# Patient Record
Sex: Male | Born: 1935 | Race: White | Hispanic: No | Marital: Married | Smoking: Former smoker
Health system: Southern US, Community
[De-identification: ages and names within clinical notes are randomized; demographics above are authoritative.]

## PROBLEM LIST (undated history)

## (undated) ENCOUNTER — Emergency Department (HOSPITAL_COMMUNITY): Admission: EM | Discharge status: Absent without leave | Payer: Medicare Other

## (undated) DIAGNOSIS — Z9221 Personal history of antineoplastic chemotherapy: Secondary | ICD-10-CM

## (undated) DIAGNOSIS — I10 Essential (primary) hypertension: Secondary | ICD-10-CM

## (undated) DIAGNOSIS — IMO0001 Reserved for inherently not codable concepts without codable children: Secondary | ICD-10-CM

## (undated) DIAGNOSIS — M1990 Unspecified osteoarthritis, unspecified site: Secondary | ICD-10-CM

## (undated) DIAGNOSIS — Z923 Personal history of irradiation: Secondary | ICD-10-CM

## (undated) DIAGNOSIS — C3490 Malignant neoplasm of unspecified part of unspecified bronchus or lung: Secondary | ICD-10-CM

## (undated) DIAGNOSIS — Z973 Presence of spectacles and contact lenses: Secondary | ICD-10-CM

## (undated) HISTORY — DX: Personal history of irradiation: Z92.3

## (undated) HISTORY — PX: LUNG BIOPSY: SHX232

## (undated) HISTORY — PX: TOTAL KNEE ARTHROPLASTY: SHX125

## (undated) HISTORY — DX: Personal history of antineoplastic chemotherapy: Z92.21

## (undated) HISTORY — PX: COLONOSCOPY: SHX174

---

## 1997-12-02 ENCOUNTER — Inpatient Hospital Stay (HOSPITAL_COMMUNITY): Admission: RE | Admit: 1997-12-02 | Discharge: 1997-12-07 | Payer: Self-pay | Admitting: Orthopaedic Surgery

## 1997-12-08 ENCOUNTER — Other Ambulatory Visit: Admission: RE | Admit: 1997-12-08 | Discharge: 1997-12-08 | Payer: Self-pay | Admitting: Orthopaedic Surgery

## 1997-12-11 ENCOUNTER — Other Ambulatory Visit: Admission: RE | Admit: 1997-12-11 | Discharge: 1997-12-11 | Payer: Self-pay | Admitting: Orthopaedic Surgery

## 1997-12-18 ENCOUNTER — Other Ambulatory Visit: Admission: RE | Admit: 1997-12-18 | Discharge: 1997-12-18 | Payer: Self-pay | Admitting: Orthopaedic Surgery

## 1997-12-25 ENCOUNTER — Ambulatory Visit (HOSPITAL_COMMUNITY): Admission: RE | Admit: 1997-12-25 | Discharge: 1997-12-25 | Payer: Self-pay | Admitting: Orthopaedic Surgery

## 1998-01-01 ENCOUNTER — Ambulatory Visit (HOSPITAL_COMMUNITY): Admission: RE | Admit: 1998-01-01 | Discharge: 1998-01-01 | Payer: Self-pay | Admitting: Orthopaedic Surgery

## 2003-09-09 ENCOUNTER — Emergency Department (HOSPITAL_COMMUNITY): Admission: EM | Admit: 2003-09-09 | Discharge: 2003-09-09 | Payer: Self-pay | Admitting: Emergency Medicine

## 2005-06-08 ENCOUNTER — Ambulatory Visit: Payer: Self-pay | Admitting: Internal Medicine

## 2005-06-23 ENCOUNTER — Ambulatory Visit: Payer: Self-pay | Admitting: Internal Medicine

## 2014-10-21 ENCOUNTER — Other Ambulatory Visit: Payer: Self-pay | Admitting: Orthopaedic Surgery

## 2014-10-21 DIAGNOSIS — M25551 Pain in right hip: Secondary | ICD-10-CM

## 2014-10-22 ENCOUNTER — Ambulatory Visit
Admission: RE | Admit: 2014-10-22 | Discharge: 2014-10-22 | Disposition: A | Discharge status: Home / Self Care | Payer: Medicare Other | Source: Ambulatory Visit | Attending: Orthopaedic Surgery | Admitting: Orthopaedic Surgery

## 2014-10-22 DIAGNOSIS — M25551 Pain in right hip: Secondary | ICD-10-CM

## 2014-10-23 ENCOUNTER — Inpatient Hospital Stay (HOSPITAL_COMMUNITY)
Admission: AD | Admit: 2014-10-23 | Discharge: 2014-10-25 | DRG: 544 | Disposition: A | Discharge status: Home / Self Care | Payer: Medicare Other | Source: Ambulatory Visit | Attending: Internal Medicine | Admitting: Internal Medicine

## 2014-10-23 ENCOUNTER — Encounter (HOSPITAL_COMMUNITY): Payer: Self-pay | Admitting: Internal Medicine

## 2014-10-23 DIAGNOSIS — I129 Hypertensive chronic kidney disease with stage 1 through stage 4 chronic kidney disease, or unspecified chronic kidney disease: Secondary | ICD-10-CM | POA: Diagnosis present

## 2014-10-23 DIAGNOSIS — N189 Chronic kidney disease, unspecified: Secondary | ICD-10-CM | POA: Diagnosis present

## 2014-10-23 DIAGNOSIS — S72001A Fracture of unspecified part of neck of right femur, initial encounter for closed fracture: Secondary | ICD-10-CM | POA: Diagnosis not present

## 2014-10-23 DIAGNOSIS — Z87891 Personal history of nicotine dependence: Secondary | ICD-10-CM | POA: Diagnosis not present

## 2014-10-23 DIAGNOSIS — M84453A Pathological fracture, unspecified femur, initial encounter for fracture: Secondary | ICD-10-CM | POA: Diagnosis present

## 2014-10-23 DIAGNOSIS — E785 Hyperlipidemia, unspecified: Secondary | ICD-10-CM | POA: Diagnosis not present

## 2014-10-23 DIAGNOSIS — M84451A Pathological fracture, right femur, initial encounter for fracture: Secondary | ICD-10-CM | POA: Diagnosis not present

## 2014-10-23 DIAGNOSIS — Z885 Allergy status to narcotic agent status: Secondary | ICD-10-CM | POA: Diagnosis not present

## 2014-10-23 DIAGNOSIS — R7309 Other abnormal glucose: Secondary | ICD-10-CM | POA: Diagnosis not present

## 2014-10-23 DIAGNOSIS — S72009A Fracture of unspecified part of neck of unspecified femur, initial encounter for closed fracture: Secondary | ICD-10-CM

## 2014-10-23 DIAGNOSIS — M8440XA Pathological fracture, unspecified site, initial encounter for fracture: Secondary | ICD-10-CM

## 2014-10-23 DIAGNOSIS — R748 Abnormal levels of other serum enzymes: Secondary | ICD-10-CM | POA: Diagnosis not present

## 2014-10-23 DIAGNOSIS — I10 Essential (primary) hypertension: Secondary | ICD-10-CM | POA: Diagnosis not present

## 2014-10-23 DIAGNOSIS — M79651 Pain in right thigh: Secondary | ICD-10-CM | POA: Diagnosis present

## 2014-10-23 HISTORY — DX: Essential (primary) hypertension: I10

## 2014-10-23 LAB — CBC WITH DIFFERENTIAL/PLATELET
Basophils Absolute: 0 10*3/uL (ref 0.0–0.1)
Basophils Relative: 0 % (ref 0–1)
Eosinophils Absolute: 0.3 10*3/uL (ref 0.0–0.7)
Eosinophils Relative: 3 % (ref 0–5)
HEMATOCRIT: 38.4 % — AB (ref 39.0–52.0)
Hemoglobin: 12.9 g/dL — ABNORMAL LOW (ref 13.0–17.0)
LYMPHS ABS: 2.5 10*3/uL (ref 0.7–4.0)
LYMPHS PCT: 28 % (ref 12–46)
MCH: 30 pg (ref 26.0–34.0)
MCHC: 33.6 g/dL (ref 30.0–36.0)
MCV: 89.3 fL (ref 78.0–100.0)
MONO ABS: 0.7 10*3/uL (ref 0.1–1.0)
Monocytes Relative: 8 % (ref 3–12)
NEUTROS PCT: 61 % (ref 43–77)
Neutro Abs: 5.2 10*3/uL (ref 1.7–7.7)
PLATELETS: 241 10*3/uL (ref 150–400)
RBC: 4.3 MIL/uL (ref 4.22–5.81)
RDW: 12.4 % (ref 11.5–15.5)
WBC: 8.7 10*3/uL (ref 4.0–10.5)

## 2014-10-23 LAB — COMPREHENSIVE METABOLIC PANEL
ALT: 32 U/L (ref 0–53)
ANION GAP: 5 (ref 5–15)
AST: 24 U/L (ref 0–37)
Albumin: 3.6 g/dL (ref 3.5–5.2)
Alkaline Phosphatase: 80 U/L (ref 39–117)
BUN: 30 mg/dL — AB (ref 6–23)
CHLORIDE: 104 mmol/L (ref 96–112)
CO2: 27 mmol/L (ref 19–32)
Calcium: 9.3 mg/dL (ref 8.4–10.5)
Creatinine, Ser: 1.5 mg/dL — ABNORMAL HIGH (ref 0.50–1.35)
GFR calc Af Amer: 50 mL/min — ABNORMAL LOW (ref >90)
GFR calc non Af Amer: 43 mL/min — ABNORMAL LOW (ref >90)
Glucose, Bld: 164 mg/dL — ABNORMAL HIGH (ref 70–99)
Potassium: 4.6 mmol/L (ref 3.5–5.1)
Sodium: 136 mmol/L (ref 135–145)
Total Bilirubin: 0.5 mg/dL (ref 0.3–1.2)
Total Protein: 6.7 g/dL (ref 6.0–8.3)

## 2014-10-23 LAB — PROTIME-INR
INR: 1.06 (ref 0.00–1.49)
PROTHROMBIN TIME: 13.9 s (ref 11.6–15.2)

## 2014-10-23 LAB — TYPE AND SCREEN
ABO/RH(D): A POS
Antibody Screen: NEGATIVE

## 2014-10-23 LAB — LACTATE DEHYDROGENASE: LDH: 122 U/L (ref 94–250)

## 2014-10-23 LAB — PHOSPHORUS: Phosphorus: 3.8 mg/dL (ref 2.3–4.6)

## 2014-10-23 MED ORDER — METHOCARBAMOL 500 MG PO TABS: 500.0000 mg | ORAL_TABLET | Freq: Four times a day (QID) | ORAL | Status: DC | PRN

## 2014-10-23 MED ORDER — HYDROCODONE-ACETAMINOPHEN 5-325 MG PO TABS: 1.0000 | ORAL_TABLET | Freq: Four times a day (QID) | ORAL | Status: DC | PRN

## 2014-10-23 MED ORDER — FENTANYL CITRATE 0.05 MG/ML IJ SOLN: 25.0000 ug | INTRAMUSCULAR | Status: DC | PRN

## 2014-10-23 MED ORDER — METHOCARBAMOL 1000 MG/10ML IJ SOLN
500.0000 mg | Freq: Four times a day (QID) | INTRAMUSCULAR | Status: DC | PRN
  Filled 2014-10-23: qty 5

## 2014-10-23 MED ORDER — HEPARIN SODIUM (PORCINE) 5000 UNIT/ML IJ SOLN
5000.0000 [IU] | Freq: Three times a day (TID) | INTRAMUSCULAR | Status: DC
  Administered 2014-10-24 (×3): 5000 [IU] via SUBCUTANEOUS
  Filled 2014-10-23 (×4): qty 1

## 2014-10-23 MED ORDER — BISACODYL 10 MG RE SUPP: 10.0000 mg | Freq: Every day | RECTAL | Status: DC | PRN

## 2014-10-23 MED ORDER — SENNOSIDES-DOCUSATE SODIUM 8.6-50 MG PO TABS: 1.0000 | ORAL_TABLET | Freq: Every evening | ORAL | Status: DC | PRN

## 2014-10-23 NOTE — Progress Notes (Signed)
Pt being w/up per Dr Durward Fortes for R acute hip pain. X ray (-) However MRI done showed Lytic Lesion. Colon (-) 2006.  Hematuria W/Up (-) for Malig per URO. Last CXR was 2010 and was (-). However Lung CA needs to be ruled out. Dr Durward Fortes admitting pt to hospital 10/23/14 and will need to get CT Chest, Ab/Pelvis and decide what to Bx after.

## 2014-10-23 NOTE — Progress Notes (Signed)
Contacted by Ma Rings, PA, for Dr. Durward Fortes for direct admission to [facility].  81 y.o. Male smoker with HTN with Rt hip pain. Xray negative, but MRI shows right femoral neck fracture concerning for pathologic fracture. No known h/o cancer. Needs hip repair and workup of bone mets.  Dr. Durward Fortes operates at [facility], so needs admission here. Accepted to floor.  PCP is Dr. Virgina Jock.  CALL FLOW MANAGER UPON ARRIVAL TO UNIT. [id number].  Doree Barthel, MD [facility]

## 2014-10-23 NOTE — H&P (Signed)
[facility] History and Physical  Patient: Travis Medina  MRN: [id number]  DOB: 1933-08-26  DOS: the patient was seen and examined on 10/23/2014 PCP: [name], MD  Chief Complaint: Right thigh pain  HPI: Travis Medina is a 81 y.o. male with Past medical history of hypertension. The patient is presenting with complaints of right thigh pain. Patient mentions since last one week when he woke up from his sleep he started having complaints of pain on his right thigh specifically when he is bearing weight on it. He mentions the pain is dull and is continuous. He denies any fever chills trauma or injury or fall. He denies any dizziness or lightheadedness chest pain palpitation shortness of breath. He denies any decrease in appetite decrease in weight. He denies any recent change in medications. He denies any burning urination or loss of control of bowel or bladder.  The patient is coming from home And at his baseline independent for most of his ADL.  Review of Systems: as mentioned in the history of present illness.  A Comprehensive review of the other systems is negative.  Past Medical History  Diagnosis Date  . Essential hypertension    Past Surgical History  Procedure Laterality Date  . Total knee arthroplasty     Social History:  reports that he has quit smoking. His smoking use included Cigarettes. He has a 25 pack-year smoking history. He does not have any smokeless tobacco history on file. His alcohol and drug histories are not on file.  Allergies  Allergen Reactions  . Morphine And Related Itching and Rash    Family History  Problem Relation Age of Onset  . Heart failure Mother   . Colon cancer Sister     Prior to Admission medications   Medication Sig Start Date End Date Taking? Authorizing Provider  acetaminophen (TYLENOL) 500 MG tablet Take 1,000 mg by mouth every 6 (six) hours as needed for mild pain.   Yes Historical Provider, MD  aspirin 325 MG  tablet Take 325 mg by mouth daily.   Yes Historical Provider, MD  lisinopril (PRINIVIL,ZESTRIL) 5 MG tablet Take 5 mg by mouth daily.   Yes Historical Provider, MD  traMADol (ULTRAM) 50 MG tablet Take 50 mg by mouth every 12 (twelve) hours as needed for moderate pain.   Yes Historical Provider, MD    Physical Exam: Filed Vitals:   10/23/14 1654 10/23/14 2013  BP: 169/68 144/82  Pulse: 91 73  Temp: 98.6 F (37 C) 97.9 F (36.6 C)  TempSrc: Oral   Resp: 18 18  SpO2: 98% 90%    General: Alert, Awake and Oriented to Time, Place and Person. Appear in mild distress Eyes: PERRL ENT: Oral Mucosa clear moist. Neck: no JVD Cardiovascular: S1 and S2 Present, no Murmur, Peripheral Pulses Present Respiratory: Bilateral Air entry equal and Decreased, Clear to Auscultation, noCrackles, no wheezes Abdomen: Bowel Sound present, Soft and non tender Skin: no Rash Extremities: no Pedal edema, no calf tenderness  Neurologic: Grossly no focal neuro deficit.  Labs on Admission:  CBC: No results for input(s): WBC, NEUTROABS, HGB, HCT, MCV, PLT in the last 168 hours.  CMP  No results found for: NA, K, CL, CO2, GLUCOSE, BUN, CREATININE, CALCIUM, PROT, ALBUMIN, AST, ALT, ALKPHOS, BILITOT, GFRNONAA, GFRAA  No results for input(s): LIPASE, AMYLASE in the last 168 hours.  No results for input(s): CKTOTAL, CKMB, CKMBINDEX, TROPONINI in the last 168 hours. BNP (last 3 results) No results for input(s): BNP in  the last 8760 hours.  ProBNP (last 3 results) No results for input(s): PROBNP in the last 8760 hours.   Radiological Exams on Admission: Mr Pelvis Wo Contrast  10/22/2014   CLINICAL DATA:  Anterior right hip pain, worse with weight-bearing.  EXAM: MR OF THE PELVIS AND RIGHT HIP WITHOUT CONTRAST  TECHNIQUE: Multiplanar, multisequence MR imaging was performed. No intravenous contrast was administered.  COMPARISON:  None.  FINDINGS: Bones: There is intense marrow edema in the right femoral neck  extending into the proximal femoral diaphysis. Edema is most intense along the inferior 1/2 of the femoral neck where there is associated associated decreased T1 signal. Findings are consistent with fracture. The fracture may be pathologic as there appears to be cortical disruption along the posterior margin at the head - neck junction of the femur on axial images 12 through 14 of series 5. Bone marrow signal is otherwise unremarkable.  Articular cartilage and labrum  Articular cartilage: Mild bilateral hip degenerative disease is seen.  Labrum: The superior labrum of the right hip is diffusely degenerated and torn.  Joint or bursal effusion  Joint effusion:  Small right hip joint effusion is seen.  Bursae:  Unremarkable.  Muscles and tendons  Muscles and tendons:  Intact.  Other findings  Miscellaneous: Imaged intrapelvic contents demonstrate mild enlargement of the prostate gland.  IMPRESSION: Right femoral neck fracture with findings worrisome for pathologic fracture with cortical disruption of the posterior head neck margin identified as described above.  Mild to moderate right hip osteoarthritis with associated degenerative tearing of the labrum.  Findings called to Biagio Borg, P.A., at the time of interpretation.   Electronically Signed   By: Inge Rise M.D.   On: 10/22/2014 12:29   Mr Hip Right Wo Contrast  10/22/2014   CLINICAL DATA:  Anterior right hip pain, worse with weight-bearing.  EXAM: MR OF THE PELVIS AND RIGHT HIP WITHOUT CONTRAST  TECHNIQUE: Multiplanar, multisequence MR imaging was performed. No intravenous contrast was administered.  COMPARISON:  None.  FINDINGS: Bones: There is intense marrow edema in the right femoral neck extending into the proximal femoral diaphysis. Edema is most intense along the inferior 1/2 of the femoral neck where there is associated associated decreased T1 signal. Findings are consistent with fracture. The fracture may be pathologic as there appears to be  cortical disruption along the posterior margin at the head - neck junction of the femur on axial images 12 through 14 of series 5. Bone marrow signal is otherwise unremarkable.  Articular cartilage and labrum  Articular cartilage: Mild bilateral hip degenerative disease is seen.  Labrum: The superior labrum of the right hip is diffusely degenerated and torn.  Joint or bursal effusion  Joint effusion:  Small right hip joint effusion is seen.  Bursae:  Unremarkable.  Muscles and tendons  Muscles and tendons:  Intact.  Other findings  Miscellaneous: Imaged intrapelvic contents demonstrate mild enlargement of the prostate gland.  IMPRESSION: Right femoral neck fracture with findings worrisome for pathologic fracture with cortical disruption of the posterior head neck margin identified as described above.  Mild to moderate right hip osteoarthritis with associated degenerative tearing of the labrum.  Findings called to Biagio Borg, P.A., at the time of interpretation.   Electronically Signed   By: Inge Rise M.D.   On: 10/22/2014 12:29    Assessment/Plan Principal Problem:   Pathologic fracture of femur Active Problems:   Essential hypertension   1. Pathologic fracture of femur  The patient  is presenting with fracture of his right femur. He denies any injury or trauma. MRI was positive for the femur fracture and findings are consistent with pathological fracture. Patient will require further workup. We get vitamin levels and routine labs. We discussed with radiology in the morning for further workup. Orthopedic has been consulted and will be following up with the patient in the morning.  2. History of high blood pressure. Holding lisinopril.  Advance goals of care discussion: Full code   Consults: Orthopedics  DVT Prophylaxis: subcutaneous Heparin Nothing by mouth except medication  Family Communication: Family was present at bedside, opportunity was given to ask question and all  questions were answered satisfactorily at the time of interview. Disposition: Admitted to inpatient in med-surge unit.  Author: Berle Mull, MD [facility] Pager: [phone] 10/23/2014, 10:02 PM    If 7PM-7AM, please contact night-coverage [email or web address] Password [email or web address]

## 2014-10-24 ENCOUNTER — Inpatient Hospital Stay (HOSPITAL_COMMUNITY): Payer: Medicare Other

## 2014-10-24 DIAGNOSIS — R7309 Other abnormal glucose: Secondary | ICD-10-CM

## 2014-10-24 DIAGNOSIS — R748 Abnormal levels of other serum enzymes: Secondary | ICD-10-CM

## 2014-10-24 DIAGNOSIS — S72001A Fracture of unspecified part of neck of right femur, initial encounter for closed fracture: Secondary | ICD-10-CM

## 2014-10-24 LAB — BASIC METABOLIC PANEL
Anion gap: 5 (ref 5–15)
BUN: 29 mg/dL — ABNORMAL HIGH (ref 6–23)
CO2: 26 mmol/L (ref 19–32)
Calcium: 9.5 mg/dL (ref 8.4–10.5)
Chloride: 104 mmol/L (ref 96–112)
Creatinine, Ser: 1.51 mg/dL — ABNORMAL HIGH (ref 0.50–1.35)
GFR calc Af Amer: 49 mL/min — ABNORMAL LOW (ref >90)
GFR, EST NON AFRICAN AMERICAN: 42 mL/min — AB (ref >90)
GLUCOSE: 113 mg/dL — AB (ref 70–99)
POTASSIUM: 4.9 mmol/L (ref 3.5–5.1)
Sodium: 135 mmol/L (ref 135–145)

## 2014-10-24 LAB — URINALYSIS, ROUTINE W REFLEX MICROSCOPIC
Bilirubin Urine: NEGATIVE
Glucose, UA: NEGATIVE mg/dL
Ketones, ur: NEGATIVE mg/dL
LEUKOCYTES UA: NEGATIVE
Nitrite: NEGATIVE
Protein, ur: NEGATIVE mg/dL
Specific Gravity, Urine: 1.013 (ref 1.005–1.030)
Urobilinogen, UA: 0.2 mg/dL (ref 0.0–1.0)
pH: 5.5 (ref 5.0–8.0)

## 2014-10-24 LAB — URINE MICROSCOPIC-ADD ON

## 2014-10-24 LAB — CBC WITH DIFFERENTIAL/PLATELET
Basophils Absolute: 0 10*3/uL (ref 0.0–0.1)
Basophils Relative: 0 % (ref 0–1)
EOS PCT: 3 % (ref 0–5)
Eosinophils Absolute: 0.3 10*3/uL (ref 0.0–0.7)
HCT: 40.7 % (ref 39.0–52.0)
Hemoglobin: 13.4 g/dL (ref 13.0–17.0)
Lymphocytes Relative: 27 % (ref 12–46)
Lymphs Abs: 2.5 10*3/uL (ref 0.7–4.0)
MCH: 30.2 pg (ref 26.0–34.0)
MCHC: 32.9 g/dL (ref 30.0–36.0)
MCV: 91.9 fL (ref 78.0–100.0)
MONO ABS: 0.8 10*3/uL (ref 0.1–1.0)
MONOS PCT: 8 % (ref 3–12)
Neutro Abs: 5.8 10*3/uL (ref 1.7–7.7)
Neutrophils Relative %: 62 % (ref 43–77)
Platelets: 250 10*3/uL (ref 150–400)
RBC: 4.43 MIL/uL (ref 4.22–5.81)
RDW: 12.6 % (ref 11.5–15.5)
WBC: 9.4 10*3/uL (ref 4.0–10.5)

## 2014-10-24 LAB — PSA: PSA: 2.15 ng/mL (ref <=4.00)

## 2014-10-24 LAB — TSH: TSH: 1.762 u[IU]/mL (ref 0.350–4.500)

## 2014-10-24 LAB — ABO/RH: ABO/RH(D): A POS

## 2014-10-24 MED ORDER — METOPROLOL TARTRATE 12.5 MG HALF TABLET
12.5000 mg | ORAL_TABLET | Freq: Two times a day (BID) | ORAL | Status: DC
  Administered 2014-10-25: 12.5 mg via ORAL
  Filled 2014-10-24: qty 1

## 2014-10-24 MED ORDER — LISINOPRIL 5 MG PO TABS
5.0000 mg | ORAL_TABLET | Freq: Every day | ORAL | Status: DC
  Administered 2014-10-24: 5 mg via ORAL
  Filled 2014-10-24: qty 1

## 2014-10-24 MED ORDER — SENNOSIDES-DOCUSATE SODIUM 8.6-50 MG PO TABS
2.0000 | ORAL_TABLET | Freq: Two times a day (BID) | ORAL | Status: DC
  Administered 2014-10-24 – 2014-10-25 (×2): 2 via ORAL
  Filled 2014-10-24 (×2): qty 2

## 2014-10-24 NOTE — Progress Notes (Signed)
Pt admitted by Hospitalists and on their service.  This was arranged by Dr Durward Fortes as I no longer have an inpatient service.  I was originally going to help Dr Durward Fortes if patient was on his service.  Since he was admitted by the hospitalists, I wanted to let them handle the whole inpatient care On admission Labs ordered and I can see on D/ced orders tha CTs were ordered and cancelled. I went ahead and got a CXR this am and it was clear. I had talked c Dr Durward Fortes on the phone yesterday and the day before.  The plan was to get CT Chest Ab Pelvis and see where primary Malignancy may be from as pt has pathologic fracture. I was under impression that no orders were done prior to me putting them in currently.  They were done and cancelled in middle of night - presumably because of the elevated Cr.  I just went ahead and put the orders in again (non contrast as Cr 1.5 - OK for Oral Contrast) - in attempt to move along this admission and his care.

## 2014-10-24 NOTE — Progress Notes (Signed)
   PROGRESS NOTE  Travis Medina XKG:[id number] DOB: 09-Feb-1934 DOA: 10/23/2014 PCP: [name], MD  HPI/Recap of past 24 hours: Returned from CT scan, no acute complaints. Multiple family member in room.  Assessment/Plan: Principal Problem:   Pathologic fracture of femur Active Problems:   Essential hypertension  Right femoral neck fracture: initially concern for pathologic fracture, per radiology, when compare CT with MRI, more consistent with stress fracture. Vit d level pending, currently no pain at rest. Ortho consulted plan for THR.  H/o HTN, will hold acei perioperatively. Start lopressor .  Elevated AM blood sugar: check a1c,   Elevated cr, likely ckd related to htn/dm. ua no acute findings. Renal dosing meds.  DVt prophylaxis with heparin  Code Status: full  Family Communication: patient and multiple family members  Disposition Plan: remain inpatient   Consultants:  ortho  Procedures:  THR pending  Antibiotics:  none   Objective: BP 145/92 mmHg  Pulse 73  Temp(Src) 98.1 F (36.7 C) (Oral)  Resp 16  SpO2 96%  Intake/Output Summary (Last 24 hours) at 10/24/14 2021 Last data filed at 10/24/14 1900  Gross per 24 hour  Intake    960 ml  Output    500 ml  Net    460 ml   There were no vitals filed for this visit.  Exam:   General:  AAOx3  Cardiovascular: RRR  Respiratory: CTABL  Abdomen: soft/NT/ND, positive bs  Musculoskeletal: mild tender right lateral thigh, limited range of motion due to pain, no pain at rest.  Data Reviewed: Basic Metabolic Panel:  Recent Labs Lab 10/23/14 2230 10/24/14 0509  NA 136 135  K 4.6 4.9  CL 104 104  CO2 27 26  GLUCOSE 164* 113*  BUN 30* 29*  CREATININE 1.50* 1.51*  CALCIUM 9.3 9.5  PHOS 3.8  --    Liver Function Tests:  Recent Labs Lab 10/23/14 2230  AST 24  ALT 32  ALKPHOS 80  BILITOT 0.5  PROT 6.7  ALBUMIN 3.6   No results for input(s): LIPASE, AMYLASE in the last 168  hours. No results for input(s): AMMONIA in the last 168 hours. CBC:  Recent Labs Lab 10/23/14 2230 10/24/14 0509  WBC 8.7 9.4  NEUTROABS 5.2 5.8  HGB 12.9* 13.4  HCT 38.4* 40.7  MCV 89.3 91.9  PLT 241 250   Cardiac Enzymes:   No results for input(s): CKTOTAL, CKMB, CKMBINDEX, TROPONINI in the last 168 hours. BNP (last 3 results) No results for input(s): BNP in the last 8760 hours.  ProBNP (last 3 results) No results for input(s): PROBNP in the last 8760 hours.  CBG: No results for input(s): GLUCAP in the last 168 hours.  No results found for this or any previous visit (from the past 240 hour(s)).   Studies: No results found.  Scheduled Meds: . heparin  5,000 Units Subcutaneous 3 times per day  . metoprolol tartrate  12.5 mg Oral BID  . senna-docusate  2 tablet Oral BID    Continuous Infusions:      Georgiann Neider  [facility] Pager [phone]. If 7PM-7AM, please contact night-coverage at [email or web address], password [email or web address] 10/24/2014, 8:21 PM  LOS: 1 day

## 2014-10-24 NOTE — Progress Notes (Signed)
Patient ID: Travis Medina, male   DOB: [birth year], 81 y.o.   MRN: [id number]  Travis Fears, MD   Biagio Borg, PA-C [address] [place], [place]  [address]                             [phone]   ORTHOPAEDIC CONSULTATION  Amiir Heckard            MRN:  [id number] DOB/SEX:  09/27/1933/male    REQUESTING PHYSICIAN:    CHIEF COMPLAINT:  Painful right hip and thigh  HISTORY: Travis Thompsonis a 81 y.o. male with a two week history of acute onset right lateral thigh pain. He denies any history of injury or trauma. His pain is present with [place], but comfortable at rest. Films of his pelvis and femur were neg;he did not respond to a local cortisone injection. Because of his persistent pain an MRI of the pelvis was performed demonstrating a bony lesion involving the femoral head extending into the intertrochanteric area with a probable pathologic fracture of the sub capital region. He has been [place] with a walker and otherwise comfortable without SOB, abdominal complaints or related LBP. Now admitted for further evaluation to determine the origin of the bony lesion i.e. Metastatic workup. He will eventually need at least a THR for rx of the hip problem when evaluation is completed and medically stable.   PAST MEDICAL HISTORY: Patient Active Problem List   Diagnosis Date Noted  . Essential hypertension 10/23/2014  . Pathologic fracture of femur 10/23/2014   Past Medical History  Diagnosis Date  . Essential hypertension    Past Surgical History  Procedure Laterality Date  . Total knee arthroplasty      MEDICATIONS:   Current facility-administered medications:  .  bisacodyl (DULCOLAX) suppository 10 mg, 10 mg, Rectal, Daily PRN, Lavina Hamman, MD .  fentaNYL (SUBLIMAZE) injection 25 mcg, 25 mcg, Intravenous, Q3H PRN, Lavina Hamman, MD .  heparin injection 5,000 Units, 5,000 Units, Subcutaneous, 3 times per day, Lavina Hamman, MD, 5,000 Units at 10/24/14 0037 .   HYDROcodone-acetaminophen (NORCO/VICODIN) 5-325 MG per tablet 1-2 tablet, 1-2 tablet, Oral, Q6H PRN, Lavina Hamman, MD .  methocarbamol (ROBAXIN) tablet 500 mg, 500 mg, Oral, Q6H PRN **OR** methocarbamol (ROBAXIN) 500 mg in dextrose 5 % 50 mL IVPB, 500 mg, Intravenous, Q6H PRN, Lavina Hamman, MD .  senna-docusate (Senokot-S) tablet 1 tablet, 1 tablet, Oral, QHS PRN, Lavina Hamman, MD  ALLERGIES:   Allergies  Allergen Reactions  . Morphine And Related Itching and Rash    REVIEW OF SYSTEMS: REVIEWED IN DETAIL IN CHART  FAMILY HISTORY:   Family History  Problem Relation Age of Onset  . Heart failure Mother   . Colon cancer Sister     SOCIAL HISTORY:   reports that he has quit smoking. His smoking use included Cigarettes. He has a 25 pack-year smoking history. He does not have any smokeless tobacco history on file. His alcohol and drug histories are not on file.Has not smoked in over 75yrs   EXAMINATION: Vital signs in last 24 hours: Temp:  [97.8 F (36.6 C)-98.6 F (37 C)] 97.8 F (36.6 C) (03/18 0511) Pulse Rate:  [61-91] 61 (03/18 0511) Resp:  [18] 18 (03/18 0511) BP: (121-169)/(68-82) 121/71 mmHg (03/18 0511) SpO2:  [90 %-98 %] 98 % (03/18 0511)    Musculoskeletal Exam  :no pain at rest, but with WB'ing has proximal  and lateral right thigh pain.No pain with IR/ER right hip. No LE edema,n/v intact. Has had prior bilater TKR's without problem   DIAGNOSTIC STUDIES: Recent laboratory studies:  Recent Labs  10/23/14 2230 10/24/14 0509  WBC 8.7 9.4  HGB 12.9* 13.4  HCT 38.4* 40.7  PLT 241 250    Recent Labs  10/23/14 2230 10/24/14 0509  NA 136 135  K 4.6 4.9  CL 104 104  CO2 27 26  BUN 30* 29*  CREATININE 1.50* 1.51*  GLUCOSE 164* 113*  CALCIUM 9.3 9.5   Lab Results  Component Value Date   INR 1.06 10/23/2014     Recent Radiographic Studies :  Mr Pelvis Wo Contrast  10/22/2014   CLINICAL DATA:  Anterior right hip pain, worse with weight-bearing.   EXAM: MR OF THE PELVIS AND RIGHT HIP WITHOUT CONTRAST  TECHNIQUE: Multiplanar, multisequence MR imaging was performed. No intravenous contrast was administered.  COMPARISON:  None.  FINDINGS: Bones: There is intense marrow edema in the right femoral neck extending into the proximal femoral diaphysis. Edema is most intense along the inferior 1/2 of the femoral neck where there is associated associated decreased T1 signal. Findings are consistent with fracture. The fracture may be pathologic as there appears to be cortical disruption along the posterior margin at the head - neck junction of the femur on axial images 12 through 14 of series 5. Bone marrow signal is otherwise unremarkable.  Articular cartilage and labrum  Articular cartilage: Mild bilateral hip degenerative disease is seen.  Labrum: The superior labrum of the right hip is diffusely degenerated and torn.  Joint or bursal effusion  Joint effusion:  Small right hip joint effusion is seen.  Bursae:  Unremarkable.  Muscles and tendons  Muscles and tendons:  Intact.  Other findings  Miscellaneous: Imaged intrapelvic contents demonstrate mild enlargement of the prostate gland.  IMPRESSION: Right femoral neck fracture with findings worrisome for pathologic fracture with cortical disruption of the posterior head neck margin identified as described above.  Mild to moderate right hip osteoarthritis with associated degenerative tearing of the labrum.  Findings called to Biagio Borg, P.A., at the time of interpretation.   Electronically Signed   By: Inge Rise M.D.   On: 10/22/2014 12:29   Mr Hip Right Wo Contrast  10/22/2014   CLINICAL DATA:  Anterior right hip pain, worse with weight-bearing.  EXAM: MR OF THE PELVIS AND RIGHT HIP WITHOUT CONTRAST  TECHNIQUE: Multiplanar, multisequence MR imaging was performed. No intravenous contrast was administered.  COMPARISON:  None.  FINDINGS: Bones: There is intense marrow edema in the right femoral neck  extending into the proximal femoral diaphysis. Edema is most intense along the inferior 1/2 of the femoral neck where there is associated associated decreased T1 signal. Findings are consistent with fracture. The fracture may be pathologic as there appears to be cortical disruption along the posterior margin at the head - neck junction of the femur on axial images 12 through 14 of series 5. Bone marrow signal is otherwise unremarkable.  Articular cartilage and labrum  Articular cartilage: Mild bilateral hip degenerative disease is seen.  Labrum: The superior labrum of the right hip is diffusely degenerated and torn.  Joint or bursal effusion  Joint effusion:  Small right hip joint effusion is seen.  Bursae:  Unremarkable.  Muscles and tendons  Muscles and tendons:  Intact.  Other findings  Miscellaneous: Imaged intrapelvic contents demonstrate mild enlargement of the prostate gland.  IMPRESSION: Right femoral  neck fracture with findings worrisome for pathologic fracture with cortical disruption of the posterior head neck margin identified as described above.  Mild to moderate right hip osteoarthritis with associated degenerative tearing of the labrum.  Findings called to Biagio Borg, P.A., at the time of interpretation.   Electronically Signed   By: Inge Rise M.D.   On: 10/22/2014 12:29    ASSESSMENT: medical evaluation of potential tumor causing hip/thigh pain, then will need probable total hip replacement. Will follow   PLAN: as above Garald Balding 10/24/2014, 7:31 AM

## 2014-10-25 DIAGNOSIS — E785 Hyperlipidemia, unspecified: Secondary | ICD-10-CM

## 2014-10-25 LAB — LIPID PANEL
CHOL/HDL RATIO: 4.3 ratio
Cholesterol: 156 mg/dL (ref 0–200)
HDL: 36 mg/dL — AB (ref >39)
LDL Cholesterol: 101 mg/dL — ABNORMAL HIGH (ref 0–99)
Triglycerides: 96 mg/dL (ref <150)
VLDL: 19 mg/dL (ref 0–40)

## 2014-10-25 LAB — BASIC METABOLIC PANEL
ANION GAP: 6 (ref 5–15)
BUN: 27 mg/dL — AB (ref 6–23)
CO2: 27 mmol/L (ref 19–32)
CREATININE: 1.41 mg/dL — AB (ref 0.50–1.35)
Calcium: 10 mg/dL (ref 8.4–10.5)
Chloride: 101 mmol/L (ref 96–112)
GFR calc Af Amer: 54 mL/min — ABNORMAL LOW (ref >90)
GFR, EST NON AFRICAN AMERICAN: 46 mL/min — AB (ref >90)
Glucose, Bld: 110 mg/dL — ABNORMAL HIGH (ref 70–99)
Potassium: 4.9 mmol/L (ref 3.5–5.1)
Sodium: 134 mmol/L — ABNORMAL LOW (ref 135–145)

## 2014-10-25 LAB — CBC
HCT: 41.5 % (ref 39.0–52.0)
HEMOGLOBIN: 13.7 g/dL (ref 13.0–17.0)
MCH: 29.8 pg (ref 26.0–34.0)
MCHC: 33 g/dL (ref 30.0–36.0)
MCV: 90.4 fL (ref 78.0–100.0)
Platelets: 267 10*3/uL (ref 150–400)
RBC: 4.59 MIL/uL (ref 4.22–5.81)
RDW: 12.6 % (ref 11.5–15.5)
WBC: 9.2 10*3/uL (ref 4.0–10.5)

## 2014-10-25 MED ORDER — ACETAMINOPHEN 500 MG PO TABS: 500.0000 mg | ORAL_TABLET | Freq: Four times a day (QID) | ORAL | Status: DC | PRN

## 2014-10-25 NOTE — Progress Notes (Signed)
Subjective:     Patient reports pain as mild.    Objective: Vital signs in last 24 hours: Temp:  [97.7 F (36.5 C)-98.1 F (36.7 C)] 98.1 F (36.7 C) (03/19 0920) Pulse Rate:  [61-78] 70 (03/19 0920) Resp:  [16-17] 17 (03/19 0920) BP: (108-147)/(65-92) 108/79 mmHg (03/19 0920) SpO2:  [96 %-100 %] 98 % (03/19 0920)  Intake/Output from previous day: 03/18 0701 - 03/19 0700 In: 960 [P.O.:960] Out: -  Intake/Output this shift: Total I/O In: 0  Out: 450 [Urine:450]   Recent Labs  10/23/14 2230 10/24/14 0509 10/25/14 0551  HGB 12.9* 13.4 13.7    Recent Labs  10/24/14 0509 10/25/14 0551  WBC 9.4 9.2  RBC 4.43 4.59  HCT 40.7 41.5  PLT 250 267    Recent Labs  10/24/14 0509 10/25/14 0551  NA 135 134*  K 4.9 4.9  CL 104 101  CO2 26 27  BUN 29* 27*  CREATININE 1.51* 1.41*  GLUCOSE 113* 110*  CALCIUM 9.5 10.0    Recent Labs  10/23/14 2230  INR 1.06    Neurovascular intact Sensation intact distally Intact pulses distally Dorsiflexion/Plantar flexion intact  Assessment/Plan:      Discussion has been made with Dr Zigmund Daniel (Radiology), Dr Marlou Sa (Ortho 2nd opinion), and Dr Florencia Reasons (hospitalist) and will discharge to home with walker NWB to TDWB on right hip   Discharge home by hospitalist Will do outpatient PET-CT SCAN as outpatient next week Pending continued workup, will wait on surgical treatment at this time  [facility] 10/25/2014, 11:19 AM

## 2014-10-25 NOTE — Discharge Summary (Signed)
Discharge Summary  Travis Medina OEU:[id number] DOB: 1934-05-25  PCP: Travis Reel, MD  Admit date: 10/23/2014 Discharge date: 10/25/2014  Time spent: 63mins  Recommendations for Outpatient Follow-up:  1. F/u with PMD, pmd to f/u on a1c/vitD test result, PMD to arrange PET scan, PMD to repeat BMP, monitor Cr level. 2. F/u with orthopedics Dr. Durward Fortes  Discharge Diagnoses:  Active Hospital Problems   Diagnosis Date Noted  . Pathologic fracture of femur 10/23/2014  . Essential hypertension 10/23/2014    Resolved Hospital Problems   Diagnosis Date Noted Date Resolved  No resolved problems to display.    Discharge Condition: stable  Diet recommendation: heart healthy/carb modified  There were no vitals filed for this visit.  History of present illness:  Travis Medina is a 81 y.o. male with Past medical history of hypertension, reported was just started lisinopril for the last two months. The patient is presenting with complaints of right thigh pain. Patient mentions since last one week when he woke up from his sleep he started having complaints of pain on his right thigh specifically when he is bearing weight on it. He mentions the pain is dull and is continuous. He denies any fever chills trauma or injury or fall. He denies any dizziness or lightheadedness chest pain palpitation shortness of breath. He denies any decrease in appetite decrease in weight. He denies any recent change in medications. He denies any burning urination or loss of control of bowel or bladder.  The patient is coming from home And at his baseline independent for most of his ADL.  Hospital Course:  Principal Problem:   Pathologic fracture of femur Active Problems:   Essential hypertension  Right femoral neck fracture: pathologic fracture vs partial stress fracture.  Dicussed with ortho Dr. Durward Fortes,  He recommend no surgery at this time, patient is to discharge home WBAT with walker, complete  outpatient PET scan. vitD level pending.  HTN, resume home bp meds, PMD to monitor cr level. meds adjustment per pmd.   Elevated AM blood sugar: check a1c, carb modified diet  Elevated cr, likely ckd related to htn/dm. ua no acute findings. Renal dosing meds.  Dyslipidemia: borderline low HDL, borderline elevated ldl, low fat diet.    Code Status: full  Family Communication: patient and wife  Disposition Plan: d/c home   Consultants:  ortho  Procedures:  none  Antibiotics:  none   Discharge Exam: BP 108/79 mmHg  Pulse 70  Temp(Src) 98.1 F (36.7 C) (Oral)  Resp 17  SpO2 98%   General: AAOx3  Cardiovascular: RRR  Respiratory: CTABL  Abdomen: soft/NT/ND, positive bs  Musculoskeletal: mild tender right lateral thigh, limited range of motion due to pain, no pain at rest.    Discharge Instructions You were cared for by a hospitalist during your hospital stay. If you have any questions about your discharge medications or the care you received while you were in the hospital after you are discharged, you can call the unit and asked to speak with the hospitalist on call if the hospitalist that took care of you is not available. Once you are discharged, your primary care physician will handle any further medical issues. Please note that NO REFILLS for any discharge medications will be authorized once you are discharged, as it is imperative that you return to your primary care physician (or establish a relationship with a primary care physician if you do not have one) for your aftercare needs so that they can reassess your need  for medications and monitor your lab values.  Discharge Instructions    Increase activity slowly    Complete by:  As directed   Walk with a walker            Medication List    TAKE these medications        acetaminophen 500 MG tablet  Commonly known as:  TYLENOL  Take 1 tablet (500 mg total) by mouth every 6 (six) hours as needed  for mild pain.     aspirin 325 MG tablet  Take 325 mg by mouth daily.     lisinopril 5 MG tablet  Commonly known as:  PRINIVIL,ZESTRIL  Take 5 mg by mouth daily.     traMADol 50 MG tablet  Commonly known as:  ULTRAM  Take 50 mg by mouth every 12 (twelve) hours as needed for moderate pain.       Allergies  Allergen Reactions  . Morphine And Related Itching and Rash       Follow-up Information    Follow up with Travis Reel, MD In 3 days.   Specialty:  Internal Medicine   Why:  pcp to arrange PET scan, f/u on lab results   Contact information:   [place] [address] [phone]       Follow up with [facility], Vonna Kotyk, MD In 2 weeks.   Specialty:  Orthopedic Surgery   Contact information:   [address] [place] [address] [phone]        The results of significant diagnostics from this hospitalization (including imaging, microbiology, ancillary and laboratory) are listed below for reference.    Significant Diagnostic Studies: Ct Abdomen Pelvis Wo Contrast  10/24/2014   CLINICAL DATA:  81 year old male with pathologic right femoral neck fracture. Evaluate for primary malignancy.  EXAM: CT CHEST, ABDOMEN AND PELVIS WITHOUT CONTRAST  TECHNIQUE: Multidetector CT imaging of the chest, abdomen and pelvis was performed following the standard protocol without IV contrast.  COMPARISON:  07/26/2010 abdominal and pelvic CT.  FINDINGS: CT CHEST FINDINGS  Mediastinum/Nodes: Coronary calcifications noted. Coronary and thoracic aortic calcifications noted. The heart and great vessels are otherwise unremarkable. No enlarged lymph nodes are identified. No pleural or pericardial effusions are present.  Lungs/Pleura:  Pulmonary nodules are identified as follows:  A 1.3 cm irregular nodule neck anterior left upper lobe (image 21).  A 1.3 cm irregular nodule within the left upper lobe (image 22)  A 3 mm ground-glass nodule in the left upper lobe (image 29  Several  2 mm subpleural nodules along the lateral new right upper lobe and along the lateral minor fissure) images 22-26)  Mild paraseptal emphysema is noted.  There is no evidence of airspace disease, consolidation or endobronchial/endotracheal lesion.  Musculoskeletal: No acute or suspicious bony abnormalities are identified.  CT ABDOMEN AND PELVIS FINDINGS  Please note that parenchymal abnormalities may be missed without intravenous contrast.  Hepatobiliary: The liver and gallbladder are unremarkable. There is no evidence of biliary dilatation.  Pancreas: Unremarkable  Spleen: Unremarkable  Adrenals/Urinary Tract: Bilateral renal atrophy again noted. There is no evidence of hydronephrosis or urinary calculi. The adrenal glands are unremarkable. Circumferential bladder wall thickening is again noted.  Stomach/Bowel: A small hiatal hernia is again identified. There is no evidence of bowel obstruction or focal wall thickening.  Vascular/Lymphatic: Prominent periportal nodes are unchanged from 2011. No suspicious enlarged lymph nodes or abdominal aortic aneurysm noted.  Reproductive: Prostate enlargement noted.  Other: No free  fluid, pneumoperitoneum or focal collection identified.  Musculoskeletal: There is mild horizontal sclerosis within the right femoral neck medially. No definite fracture line is noted. The MR findings in the proximal right femur are significantly more impressive than the CT findings, and the right femoral neck findings compatible with a stress fracture rather than a pathologic fracture.  Compression fractures of T12 and L1 are again noted.  IMPRESSION: Mild sclerosis within the medial right femoral neck compatible with stress fracture (rather than pathologic fracture) when correlated with the MR.  Two separate 1.3 cm irregular nodules within the left upper lobe-suspicious for pulmonary malignancy. PET-CT is recommended for further evaluation.  Bilateral renal atrophy.  Mild emphysema and prostate  enlargement.   Electronically Signed   By: Margarette Canada M.D.   On: 10/24/2014 19:43   Ct Chest Wo Contrast  10/24/2014   CLINICAL DATA:  81 year old male with pathologic right femoral neck fracture. Evaluate for primary malignancy.  EXAM: CT CHEST, ABDOMEN AND PELVIS WITHOUT CONTRAST  TECHNIQUE: Multidetector CT imaging of the chest, abdomen and pelvis was performed following the standard protocol without IV contrast.  COMPARISON:  07/26/2010 abdominal and pelvic CT.  FINDINGS: CT CHEST FINDINGS  Mediastinum/Nodes: Coronary calcifications noted. Coronary and thoracic aortic calcifications noted. The heart and great vessels are otherwise unremarkable. No enlarged lymph nodes are identified. No pleural or pericardial effusions are present.  Lungs/Pleura:  Pulmonary nodules are identified as follows:  A 1.3 cm irregular nodule neck anterior left upper lobe (image 21).  A 1.3 cm irregular nodule within the left upper lobe (image 22)  A 3 mm ground-glass nodule in the left upper lobe (image 29  Several 2 mm subpleural nodules along the lateral new right upper lobe and along the lateral minor fissure) images 22-26)  Mild paraseptal emphysema is noted.  There is no evidence of airspace disease, consolidation or endobronchial/endotracheal lesion.  Musculoskeletal: No acute or suspicious bony abnormalities are identified.  CT ABDOMEN AND PELVIS FINDINGS  Please note that parenchymal abnormalities may be missed without intravenous contrast.  Hepatobiliary: The liver and gallbladder are unremarkable. There is no evidence of biliary dilatation.  Pancreas: Unremarkable  Spleen: Unremarkable  Adrenals/Urinary Tract: Bilateral renal atrophy again noted. There is no evidence of hydronephrosis or urinary calculi. The adrenal glands are unremarkable. Circumferential bladder wall thickening is again noted.  Stomach/Bowel: A small hiatal hernia is again identified. There is no evidence of bowel obstruction or focal wall thickening.   Vascular/Lymphatic: Prominent periportal nodes are unchanged from 2011. No suspicious enlarged lymph nodes or abdominal aortic aneurysm noted.  Reproductive: Prostate enlargement noted.  Other: No free fluid, pneumoperitoneum or focal collection identified.  Musculoskeletal: There is mild horizontal sclerosis within the right femoral neck medially. No definite fracture line is noted. The MR findings in the proximal right femur are significantly more impressive than the CT findings, and the right femoral neck findings compatible with a stress fracture rather than a pathologic fracture.  Compression fractures of T12 and L1 are again noted.  IMPRESSION: Mild sclerosis within the medial right femoral neck compatible with stress fracture (rather than pathologic fracture) when correlated with the MR.  Two separate 1.3 cm irregular nodules within the left upper lobe-suspicious for pulmonary malignancy. PET-CT is recommended for further evaluation.  Bilateral renal atrophy.  Mild emphysema and prostate enlargement.   Electronically Signed   By: Margarette Canada M.D.   On: 10/24/2014 19:43   Mr Pelvis Wo Contrast  10/22/2014   CLINICAL DATA:  Anterior right hip pain, worse with weight-bearing.  EXAM: MR OF THE PELVIS AND RIGHT HIP WITHOUT CONTRAST  TECHNIQUE: Multiplanar, multisequence MR imaging was performed. No intravenous contrast was administered.  COMPARISON:  None.  FINDINGS: Bones: There is intense marrow edema in the right femoral neck extending into the proximal femoral diaphysis. Edema is most intense along the inferior 1/2 of the femoral neck where there is associated associated decreased T1 signal. Findings are consistent with fracture. The fracture may be pathologic as there appears to be cortical disruption along the posterior margin at the head - neck junction of the femur on axial images 12 through 14 of series 5. Bone marrow signal is otherwise unremarkable.  Articular cartilage and labrum  Articular  cartilage: Mild bilateral hip degenerative disease is seen.  Labrum: The superior labrum of the right hip is diffusely degenerated and torn.  Joint or bursal effusion  Joint effusion:  Small right hip joint effusion is seen.  Bursae:  Unremarkable.  Muscles and tendons  Muscles and tendons:  Intact.  Other findings  Miscellaneous: Imaged intrapelvic contents demonstrate mild enlargement of the prostate gland.  IMPRESSION: Right femoral neck fracture with findings worrisome for pathologic fracture with cortical disruption of the posterior head neck margin identified as described above.  Mild to moderate right hip osteoarthritis with associated degenerative tearing of the labrum.  Findings called to Biagio Borg, P.A., at the time of interpretation.   Electronically Signed   By: Inge Rise M.D.   On: 10/22/2014 12:29   Mr Hip Right Wo Contrast  10/22/2014   CLINICAL DATA:  Anterior right hip pain, worse with weight-bearing.  EXAM: MR OF THE PELVIS AND RIGHT HIP WITHOUT CONTRAST  TECHNIQUE: Multiplanar, multisequence MR imaging was performed. No intravenous contrast was administered.  COMPARISON:  None.  FINDINGS: Bones: There is intense marrow edema in the right femoral neck extending into the proximal femoral diaphysis. Edema is most intense along the inferior 1/2 of the femoral neck where there is associated associated decreased T1 signal. Findings are consistent with fracture. The fracture may be pathologic as there appears to be cortical disruption along the posterior margin at the head - neck junction of the femur on axial images 12 through 14 of series 5. Bone marrow signal is otherwise unremarkable.  Articular cartilage and labrum  Articular cartilage: Mild bilateral hip degenerative disease is seen.  Labrum: The superior labrum of the right hip is diffusely degenerated and torn.  Joint or bursal effusion  Joint effusion:  Small right hip joint effusion is seen.  Bursae:  Unremarkable.  Muscles and  tendons  Muscles and tendons:  Intact.  Other findings  Miscellaneous: Imaged intrapelvic contents demonstrate mild enlargement of the prostate gland.  IMPRESSION: Right femoral neck fracture with findings worrisome for pathologic fracture with cortical disruption of the posterior head neck margin identified as described above.  Mild to moderate right hip osteoarthritis with associated degenerative tearing of the labrum.  Findings called to Biagio Borg, P.A., at the time of interpretation.   Electronically Signed   By: Inge Rise M.D.   On: 10/22/2014 12:29   Dg Chest Port 1 View  10/24/2014   CLINICAL DATA:  Essential hypertension  EXAM: PORTABLE CHEST - 1 VIEW  COMPARISON:  None.  FINDINGS: The heart size and mediastinal contours are within normal limits. Both lungs are clear. The visualized skeletal structures are unremarkable. Mild apical scarring bilaterally.  IMPRESSION: No active disease.   Electronically Signed  By: Franchot Gallo M.D.   On: 10/24/2014 09:09    Microbiology: No results found for this or any previous visit (from the past 240 hour(s)).   Labs: Basic Metabolic Panel:  Recent Labs Lab 10/23/14 2230 10/24/14 0509 10/25/14 0551  NA 136 135 134*  K 4.6 4.9 4.9  CL 104 104 101  CO2 27 26 27   GLUCOSE 164* 113* 110*  BUN 30* 29* 27*  CREATININE 1.50* 1.51* 1.41*  CALCIUM 9.3 9.5 10.0  PHOS 3.8  --   --    Liver Function Tests:  Recent Labs Lab 10/23/14 2230  AST 24  ALT 32  ALKPHOS 80  BILITOT 0.5  PROT 6.7  ALBUMIN 3.6   No results for input(s): LIPASE, AMYLASE in the last 168 hours. No results for input(s): AMMONIA in the last 168 hours. CBC:  Recent Labs Lab 10/23/14 2230 10/24/14 0509 10/25/14 0551  WBC 8.7 9.4 9.2  NEUTROABS 5.2 5.8  --   HGB 12.9* 13.4 13.7  HCT 38.4* 40.7 41.5  MCV 89.3 91.9 90.4  PLT 241 250 267   Cardiac Enzymes: No results for input(s): CKTOTAL, CKMB, CKMBINDEX, TROPONINI in the last 168 hours. BNP: BNP  (last 3 results) No results for input(s): BNP in the last 8760 hours.  ProBNP (last 3 results) No results for input(s): PROBNP in the last 8760 hours.  CBG: No results for input(s): GLUCAP in the last 168 hours.     SignedFlorencia Reasons MD, PhD  [facility] 10/25/2014, 11:36 AM

## 2014-10-27 ENCOUNTER — Other Ambulatory Visit (HOSPITAL_COMMUNITY): Payer: Self-pay | Admitting: Internal Medicine

## 2014-10-27 DIAGNOSIS — R918 Other nonspecific abnormal finding of lung field: Secondary | ICD-10-CM

## 2014-10-27 LAB — HEMOGLOBIN A1C
Hgb A1c MFr Bld: 6.6 % — ABNORMAL HIGH (ref 4.8–5.6)
Mean Plasma Glucose: 143 mg/dL

## 2014-10-27 LAB — FREE PSA
PSA FREE PCT: 32 % (ref >25)
PSA FREE: 0.8 ng/mL

## 2014-10-27 LAB — PSA: PSA: 2.53 ng/mL (ref <=4.00)

## 2014-10-28 ENCOUNTER — Other Ambulatory Visit (HOSPITAL_COMMUNITY)
Admission: RE | Admit: 2014-10-28 | Discharge: 2014-10-28 | Disposition: A | Discharge status: Home / Self Care | Payer: Medicare Other | Source: Ambulatory Visit | Attending: Internal Medicine | Admitting: Internal Medicine

## 2014-10-28 DIAGNOSIS — Z029 Encounter for administrative examinations, unspecified: Secondary | ICD-10-CM | POA: Diagnosis present

## 2014-10-29 LAB — VITAMIN D 25 HYDROXY (VIT D DEFICIENCY, FRACTURES): VIT D 25 HYDROXY: 13.2 ng/mL — AB (ref 30.0–100.0)

## 2014-11-06 ENCOUNTER — Ambulatory Visit (HOSPITAL_COMMUNITY)
Admission: RE | Admit: 2014-11-06 | Discharge: 2014-11-06 | Disposition: A | Discharge status: Home / Self Care | Payer: Medicare Other | Source: Ambulatory Visit | Attending: Internal Medicine | Admitting: Internal Medicine

## 2014-11-06 DIAGNOSIS — R918 Other nonspecific abnormal finding of lung field: Secondary | ICD-10-CM | POA: Diagnosis present

## 2014-11-06 LAB — GLUCOSE, CAPILLARY: Glucose-Capillary: 108 mg/dL — ABNORMAL HIGH (ref 70–99)

## 2014-11-06 MED ORDER — FLUDEOXYGLUCOSE F - 18 (FDG) INJECTION
9.8200 | Freq: Once | INTRAVENOUS | Status: AC | PRN
  Administered 2014-11-06: 9.82 via INTRAVENOUS

## 2014-11-07 ENCOUNTER — Telehealth: Payer: Self-pay | Admitting: Internal Medicine

## 2014-11-07 ENCOUNTER — Encounter: Payer: Self-pay | Admitting: *Deleted

## 2014-11-07 NOTE — Telephone Encounter (Signed)
S/W PT IN REF TO NP APPT. ON 11/14/14@10 :00

## 2014-11-07 NOTE — [facility] Oncology Navigator Note (Signed)
In basket message and phone call made to Celene Skeen, HIM regarding patient's referral from Dr. Virgina Jock to Dr. [name] for LUL nodules suspicious for pulmonary malignancy.  Requested that patient be scheduled to see Dr. [name] next week.

## 2014-11-10 NOTE — Telephone Encounter (Signed)
1 

## 2014-11-11 ENCOUNTER — Telehealth: Payer: Self-pay | Admitting: Internal Medicine

## 2014-11-11 NOTE — Telephone Encounter (Signed)
Chart delivered 11/11/14

## 2014-11-14 ENCOUNTER — Ambulatory Visit (HOSPITAL_BASED_OUTPATIENT_CLINIC_OR_DEPARTMENT_OTHER): Payer: Medicare Other | Admitting: Internal Medicine

## 2014-11-14 ENCOUNTER — Ambulatory Visit: Payer: Medicare Other

## 2014-11-14 ENCOUNTER — Other Ambulatory Visit (HOSPITAL_BASED_OUTPATIENT_CLINIC_OR_DEPARTMENT_OTHER): Payer: Medicare Other

## 2014-11-14 ENCOUNTER — Encounter: Payer: Self-pay | Admitting: Internal Medicine

## 2014-11-14 VITALS — BP 162/80 | HR 84 | Temp 97.9°F | Resp 18 | Wt 201.1 lb

## 2014-11-14 DIAGNOSIS — R918 Other nonspecific abnormal finding of lung field: Secondary | ICD-10-CM | POA: Insufficient documentation

## 2014-11-14 NOTE — Progress Notes (Signed)
Checked in new pt with no financial concerns at this time.  Pt has my card for any billing questions or concerns. °

## 2014-11-14 NOTE — Progress Notes (Signed)
[place] [phone]   [phone]  CONSULT NOTE  REFERRING PHYSICIAN: Dr. Shon Baton  REASON FOR CONSULTATION:  81 years old white male with questionable lung cancer.  HPI Travis Medina is a 81 y.o. male was past medical history significant for hypertension, benign prostatic hypertrophy, osteoporosis as well as questionable history of COPD and chronic kidney disease. The patient has been complaining of right hip pain. He was seen by Dr. Joni Fears for evaluation and MRI of the right hip and pelvis were performed on 10/22/2014 and it showed right femoral neck fracture with findings worrisome for pathologic fracture with cortical disruption of the posterior neck margin. Because of the concern about metastatic disease the patient was seen by his primary care physician Dr. Virgina Jock and CT scan of the chest, abdomen and pelvis were performed on 10/24/2014 and it showed 2 separate 1.3 cm irregular nodules within the left upper lobe suspicious for primary malignancy. A PET scan was performed on 11/06/2014 and it showed Two left upper lobe pulmonary nodules are again noted. One of these is anterior and in contact with the pleura measuring 11 x 14 mm, and is hypermetabolic (SUVmax = 4.4). The other is medially located in the left upper lobe measuring 11 x 14 mm, and is also hypermetabolic (SUVmax = 8.6). Another 5 mm left upper lobe nodule is noted, unchanged in retrospect compared to the prior examination, and not hypermetabolic (likely below the resolution of PET imaging). Several other smaller pulmonary nodules are also noted measuring 2-4 mm in size scattered throughout the lungs bilaterally. These are concerning for primary bronchogenic neoplasm, potentially 2 synchronous lesions. In addition, there are nonenlarged but hypermetabolic AP window and right paratracheal lymph nodes, which are nonspecific but concerning for potential nodal involvement. Dr. Virgina Jock  kindly referred the patient to me today for evaluation and discussion of his treatment options. When seen today the patient is feeling fine with no specific complaints. He is still unable to bear weight on the right hip after his hip fracture. The patient denied having any significant weight loss or night sweats. He denied having any chest pain, shortness breath, cough or hemoptysis. No significant nausea or vomiting. He has no headache or visual changes. Family history significant for mother with heart disease and father with kidney disease. He also has a sister with colon cancer. The patient is married and has 2 children. He was accompanied today by his wife Travis Medina. He is currently retired and used to work in a [employer]. He has a history of smoking 1 pack per day for around 22 years and quit 38 years ago. He has no history of alcohol or drug abuse.   HPI  Past Medical History  Diagnosis Date  . Essential hypertension     Past Surgical History  Procedure Laterality Date  . Total knee arthroplasty      Family History  Problem Relation Age of Onset  . Heart failure Mother   . Colon cancer Sister     Social History History  Substance Use Topics  . Smoking status: Former Smoker -- 1.00 packs/day for 25 years    Types: Cigarettes  . Smokeless tobacco: Not on file  . Alcohol Use: No    Allergies  Allergen Reactions  . Morphine And Related Itching and Rash    Current Outpatient Prescriptions  Medication Sig Dispense Refill  . aspirin 325 MG tablet Take 325 mg by mouth daily.    [name]  lisinopril (PRINIVIL,ZESTRIL) 5 MG tablet Take 5 mg by mouth daily.    . traMADol (ULTRAM) 50 MG tablet Take 50 mg by mouth every 12 (twelve) hours as needed for moderate pain.    [name] acetaminophen (TYLENOL) 500 MG tablet Take 1 tablet (500 mg total) by mouth every 6 (six) hours as needed for mild pain. (Patient not taking: Reported on 11/14/2014) 30 tablet 0   No current facility-administered  medications for this visit.    Review of Systems  Constitutional: negative Eyes: negative Ears, nose, mouth, throat, and face: negative Respiratory: negative Cardiovascular: negative Gastrointestinal: negative Genitourinary:negative Integument/breast: negative Hematologic/lymphatic: negative Musculoskeletal:positive for bone pain and Right hip pain Neurological: negative Behavioral/Psych: negative Endocrine: negative Allergic/Immunologic: negative  Physical Exam  OEH:OZYYQ, healthy, no distress, well nourished and well developed SKIN: skin color, texture, turgor are normal, no rashes or significant lesions HEAD: Normocephalic, No masses, lesions, tenderness or abnormalities EYES: normal, PERRLA, Conjunctiva are pink and non-injected EARS: External ears normal, Canals clear OROPHARYNX:no exudate, no erythema and lips, buccal mucosa, and tongue normal  NECK: supple, no adenopathy, no JVD LYMPH:  no palpable lymphadenopathy, no hepatosplenomegaly LUNGS: clear to auscultation , and palpation HEART: regular rate & rhythm and no murmurs ABDOMEN:abdomen soft, non-tender, normal bowel sounds and no masses or organomegaly BACK: Back symmetric, no curvature., No CVA tenderness EXTREMITIES:no joint deformities, effusion, or inflammation, no edema, no skin discoloration  NEURO: alert & oriented x 3 with fluent speech, no focal motor/sensory deficits  PERFORMANCE STATUS: ECOG 1  LABORATORY DATA: Lab Results  Component Value Date   WBC 9.2 10/25/2014   HGB 13.7 10/25/2014   HCT 41.5 10/25/2014   MCV 90.4 10/25/2014   PLT 267 10/25/2014      Chemistry      Component Value Date/Time   NA 134* 10/25/2014 0551   K 4.9 10/25/2014 0551   CL 101 10/25/2014 0551   CO2 27 10/25/2014 0551   BUN 27* 10/25/2014 0551   CREATININE 1.41* 10/25/2014 0551      Component Value Date/Time   CALCIUM 10.0 10/25/2014 0551   ALKPHOS 80 10/23/2014 2230   AST 24 10/23/2014 2230   ALT 32  10/23/2014 2230   BILITOT 0.5 10/23/2014 2230       RADIOGRAPHIC STUDIES: Ct Abdomen Pelvis Wo Contrast  10/24/2014   CLINICAL DATA:  81 year old male with pathologic right femoral neck fracture. Evaluate for primary malignancy.  EXAM: CT CHEST, ABDOMEN AND PELVIS WITHOUT CONTRAST  TECHNIQUE: Multidetector CT imaging of the chest, abdomen and pelvis was performed following the standard protocol without IV contrast.  COMPARISON:  07/26/2010 abdominal and pelvic CT.  FINDINGS: CT CHEST FINDINGS  Mediastinum/Nodes: Coronary calcifications noted. Coronary and thoracic aortic calcifications noted. The heart and great vessels are otherwise unremarkable. No enlarged lymph nodes are identified. No pleural or pericardial effusions are present.  Lungs/Pleura:  Pulmonary nodules are identified as follows:  A 1.3 cm irregular nodule neck anterior left upper lobe (image 21).  A 1.3 cm irregular nodule within the left upper lobe (image 22)  A 3 mm ground-glass nodule in the left upper lobe (image 29  Several 2 mm subpleural nodules along the lateral new right upper lobe and along the lateral minor fissure) images 22-26)  Mild paraseptal emphysema is noted.  There is no evidence of airspace disease, consolidation or endobronchial/endotracheal lesion.  Musculoskeletal: No acute or suspicious bony abnormalities are identified.  CT ABDOMEN AND PELVIS FINDINGS  Please note that parenchymal abnormalities may be  missed without intravenous contrast.  Hepatobiliary: The liver and gallbladder are unremarkable. There is no evidence of biliary dilatation.  Pancreas: Unremarkable  Spleen: Unremarkable  Adrenals/Urinary Tract: Bilateral renal atrophy again noted. There is no evidence of hydronephrosis or urinary calculi. The adrenal glands are unremarkable. Circumferential bladder wall thickening is again noted.  Stomach/Bowel: A small hiatal hernia is again identified. There is no evidence of bowel obstruction or focal wall  thickening.  Vascular/Lymphatic: Prominent periportal nodes are unchanged from 2011. No suspicious enlarged lymph nodes or abdominal aortic aneurysm noted.  Reproductive: Prostate enlargement noted.  Other: No free fluid, pneumoperitoneum or focal collection identified.  Musculoskeletal: There is mild horizontal sclerosis within the right femoral neck medially. No definite fracture line is noted. The MR findings in the proximal right femur are significantly more impressive than the CT findings, and the right femoral neck findings compatible with a stress fracture rather than a pathologic fracture.  Compression fractures of T12 and L1 are again noted.  IMPRESSION: Mild sclerosis within the medial right femoral neck compatible with stress fracture (rather than pathologic fracture) when correlated with the MR.  Two separate 1.3 cm irregular nodules within the left upper lobe-suspicious for pulmonary malignancy. PET-CT is recommended for further evaluation.  Bilateral renal atrophy.  Mild emphysema and prostate enlargement.   Electronically Signed   By: Margarette Canada M.D.   On: 10/24/2014 19:43   Ct Chest Wo Contrast  10/24/2014   CLINICAL DATA:  81 year old male with pathologic right femoral neck fracture. Evaluate for primary malignancy.  EXAM: CT CHEST, ABDOMEN AND PELVIS WITHOUT CONTRAST  TECHNIQUE: Multidetector CT imaging of the chest, abdomen and pelvis was performed following the standard protocol without IV contrast.  COMPARISON:  07/26/2010 abdominal and pelvic CT.  FINDINGS: CT CHEST FINDINGS  Mediastinum/Nodes: Coronary calcifications noted. Coronary and thoracic aortic calcifications noted. The heart and great vessels are otherwise unremarkable. No enlarged lymph nodes are identified. No pleural or pericardial effusions are present.  Lungs/Pleura:  Pulmonary nodules are identified as follows:  A 1.3 cm irregular nodule neck anterior left upper lobe (image 21).  A 1.3 cm irregular nodule within the left  upper lobe (image 22)  A 3 mm ground-glass nodule in the left upper lobe (image 29  Several 2 mm subpleural nodules along the lateral new right upper lobe and along the lateral minor fissure) images 22-26)  Mild paraseptal emphysema is noted.  There is no evidence of airspace disease, consolidation or endobronchial/endotracheal lesion.  Musculoskeletal: No acute or suspicious bony abnormalities are identified.  CT ABDOMEN AND PELVIS FINDINGS  Please note that parenchymal abnormalities may be missed without intravenous contrast.  Hepatobiliary: The liver and gallbladder are unremarkable. There is no evidence of biliary dilatation.  Pancreas: Unremarkable  Spleen: Unremarkable  Adrenals/Urinary Tract: Bilateral renal atrophy again noted. There is no evidence of hydronephrosis or urinary calculi. The adrenal glands are unremarkable. Circumferential bladder wall thickening is again noted.  Stomach/Bowel: A small hiatal hernia is again identified. There is no evidence of bowel obstruction or focal wall thickening.  Vascular/Lymphatic: Prominent periportal nodes are unchanged from 2011. No suspicious enlarged lymph nodes or abdominal aortic aneurysm noted.  Reproductive: Prostate enlargement noted.  Other: No free fluid, pneumoperitoneum or focal collection identified.  Musculoskeletal: There is mild horizontal sclerosis within the right femoral neck medially. No definite fracture line is noted. The MR findings in the proximal right femur are significantly more impressive than the CT findings, and the right femoral neck findings  compatible with a stress fracture rather than a pathologic fracture.  Compression fractures of T12 and L1 are again noted.  IMPRESSION: Mild sclerosis within the medial right femoral neck compatible with stress fracture (rather than pathologic fracture) when correlated with the MR.  Two separate 1.3 cm irregular nodules within the left upper lobe-suspicious for pulmonary malignancy. PET-CT is  recommended for further evaluation.  Bilateral renal atrophy.  Mild emphysema and prostate enlargement.   Electronically Signed   By: Margarette Canada M.D.   On: 10/24/2014 19:43   Mr Pelvis Wo Contrast  10/22/2014   CLINICAL DATA:  Anterior right hip pain, worse with weight-bearing.  EXAM: MR OF THE PELVIS AND RIGHT HIP WITHOUT CONTRAST  TECHNIQUE: Multiplanar, multisequence MR imaging was performed. No intravenous contrast was administered.  COMPARISON:  None.  FINDINGS: Bones: There is intense marrow edema in the right femoral neck extending into the proximal femoral diaphysis. Edema is most intense along the inferior 1/2 of the femoral neck where there is associated associated decreased T1 signal. Findings are consistent with fracture. The fracture may be pathologic as there appears to be cortical disruption along the posterior margin at the head - neck junction of the femur on axial images 12 through 14 of series 5. Bone marrow signal is otherwise unremarkable.  Articular cartilage and labrum  Articular cartilage: Mild bilateral hip degenerative disease is seen.  Labrum: The superior labrum of the right hip is diffusely degenerated and torn.  Joint or bursal effusion  Joint effusion:  Small right hip joint effusion is seen.  Bursae:  Unremarkable.  Muscles and tendons  Muscles and tendons:  Intact.  Other findings  Miscellaneous: Imaged intrapelvic contents demonstrate mild enlargement of the prostate gland.  IMPRESSION: Right femoral neck fracture with findings worrisome for pathologic fracture with cortical disruption of the posterior head neck margin identified as described above.  Mild to moderate right hip osteoarthritis with associated degenerative tearing of the labrum.  Findings called to Biagio Borg, P.A., at the time of interpretation.   Electronically Signed   By: Inge Rise M.D.   On: 10/22/2014 12:29   Mr Hip Right Wo Contrast  10/22/2014   CLINICAL DATA:  Anterior right hip pain, worse  with weight-bearing.  EXAM: MR OF THE PELVIS AND RIGHT HIP WITHOUT CONTRAST  TECHNIQUE: Multiplanar, multisequence MR imaging was performed. No intravenous contrast was administered.  COMPARISON:  None.  FINDINGS: Bones: There is intense marrow edema in the right femoral neck extending into the proximal femoral diaphysis. Edema is most intense along the inferior 1/2 of the femoral neck where there is associated associated decreased T1 signal. Findings are consistent with fracture. The fracture may be pathologic as there appears to be cortical disruption along the posterior margin at the head - neck junction of the femur on axial images 12 through 14 of series 5. Bone marrow signal is otherwise unremarkable.  Articular cartilage and labrum  Articular cartilage: Mild bilateral hip degenerative disease is seen.  Labrum: The superior labrum of the right hip is diffusely degenerated and torn.  Joint or bursal effusion  Joint effusion:  Small right hip joint effusion is seen.  Bursae:  Unremarkable.  Muscles and tendons  Muscles and tendons:  Intact.  Other findings  Miscellaneous: Imaged intrapelvic contents demonstrate mild enlargement of the prostate gland.  IMPRESSION: Right femoral neck fracture with findings worrisome for pathologic fracture with cortical disruption of the posterior head neck margin identified as described above.  Mild to  moderate right hip osteoarthritis with associated degenerative tearing of the labrum.  Findings called to Biagio Borg, P.A., at the time of interpretation.   Electronically Signed   By: Inge Rise M.D.   On: 10/22/2014 12:29   Nm Pet Image Initial (pi) Skull Base To Thigh  11/06/2014   CLINICAL DATA:  Initial treatment strategy for left upper lobe lung nodules.  EXAM: NUCLEAR MEDICINE PET SKULL BASE TO THIGH  TECHNIQUE: 9.8 mCi F-18 FDG was injected intravenously. Full-ring PET imaging was performed from the skull base to thigh after the radiotracer. CT data was  obtained and used for attenuation correction and anatomic localization.  FASTING BLOOD GLUCOSE:  Value: 108 mg/dl  COMPARISON:  Chest CT 10/24/2014.  FINDINGS: NECK  No hypermetabolic lymph nodes in the neck.  CHEST  Two left upper lobe pulmonary nodules are again noted. One of these is anterior and in contact with the pleura measuring 11 x 14 mm (image 36 of series 6), and is hypermetabolic (SUVmax = 4.4). The other is medially located in the left upper lobe (image 37 of series 6) measuring 11 x 14 mm, and is also hypermetabolic (SUVmax = 8.6). Another 5 mm left upper lobe nodule is noted (image 40 of series 6), unchanged in retrospect compared to the prior examination, and not hypermetabolic (likely below the resolution of PET imaging). Several other smaller pulmonary nodules are also noted measuring 2-4 mm in size scattered throughout the lungs bilaterally. Although there are no enlarged mediastinal or hilar lymph nodes, there are borderline enlarged AP window and low right paratracheal lymph nodes, both of which measured 8 mm in short axis, which demonstrate low-level metabolic activity (SUVmax = 3.3 and 2.7 respectively), concerning for potential nodal involvement. Heart size is normal. There is no significant pericardial fluid, thickening or pericardial calcification. There is atherosclerosis of the thoracic aorta, the great vessels of the mediastinum and the coronary arteries, including calcified atherosclerotic plaque in the left main and left anterior descending coronary arteries. Mild paraseptal emphysema.  ABDOMEN/PELVIS  No abnormal hypermetabolic activity within the liver, pancreas, adrenal glands, or spleen. No hypermetabolic lymph nodes in the abdomen or pelvis. No significant volume of ascites. No pathologic distention of small bowel or colon. Extensive atherosclerosis throughout the abdominal aorta and major pelvic vasculature, without definite aneurysm. Small bilateral hydroceles noted scrotum.   SKELETON  Hypermetabolism in the right femoral neck, compatible with a healing nondisplaced fracture. No definite aggressive osseous lesion in this region to suggest presence of a pathologic fracture. There is also a small focus of hypermetabolism in the intercostal musculature between the lateral left third and fourth ribs, with some adjacent similar low-level hypermetabolism in other left-sided intercostal muscles, favored to be physiologic related to recent muscular strain. No discrete soft tissue or osseous lesion is noted in this region to suggest the presence of a metastatic focus. No focal hypermetabolic activity to suggest skeletal metastasis.  IMPRESSION: 1. Multiple pulmonary nodules in the lungs bilaterally, the majority of which are nonspecific. There are 2 larger pulmonary nodules in the left upper lobe which are hypermetabolic, as discussed above. These are concerning for primary bronchogenic neoplasm, potentially 2 synchronous lesions. In addition, there are nonenlarged but hypermetabolic AP window and right paratracheal lymph nodes, which are nonspecific but concerning for potential nodal involvement. Multiple other smaller pulmonary nodules in the lungs bilaterally are highly nonspecific and warrant attention on followup studies. Correlation with biopsy is recommended at this time to establish a tissue diagnosis. 2.  Healing nondisplaced right femoral neck fracture, without features to suggest a pathologic fracture. 3. Additional incidental findings, as above.   Electronically Signed   By: Vinnie Langton M.D.   On: 11/06/2014 09:37   Dg Chest Port 1 View  10/24/2014   CLINICAL DATA:  Essential hypertension  EXAM: PORTABLE CHEST - 1 VIEW  COMPARISON:  None.  FINDINGS: The heart size and mediastinal contours are within normal limits. Both lungs are clear. The visualized skeletal structures are unremarkable. Mild apical scarring bilaterally.  IMPRESSION: No active disease.   Electronically Signed    By: Franchot Gallo M.D.   On: 10/24/2014 09:09    ASSESSMENT: This is a very pleasant 81 years old white male with questionable stage IIb (T3, N0, M0) non-small cell lung cancer presenting with 2 separate nodules in the left upper lobe, pending further staging workup and tissue diagnosis.   PLAN: I had a lengthy discussion with the patient and his wife today about his current disease condition and further investigation to confirm his diagnosis and treatment options. I will complete the staging workup by ordering a MRI of the brain to rule out brain metastasis. I will also refer the patient to interventional radiology for consideration of CT-guided core biopsy of one of the left upper lobe pulmonary nodules. I will arrange for the patient to see cardiothoracic surgery next week at the multidisciplinary thoracic oncology clinic for evaluation and discussion of surgical option. I will see the patient back for follow-up visit next week after his tissue biopsy and MRI of the brain for more detailed discussion of his treatment options. The patient agreed to the current plan. He was advised to call immediately if he has any concerning symptoms in the interval.  The patient voices understanding of current disease status and treatment options and is in agreement with the current care plan.  All questions were answered. The patient knows to call the clinic with any problems, questions or concerns. We can certainly see the patient much sooner if necessary.  Thank you so much for allowing me to participate in the care of [facility]. I will continue to follow up the patient with you and assist in his care.  I spent 40 minutes counseling the patient face to face. The total time spent in the appointment was 60 minutes.  Disclaimer: This note was dictated with voice recognition software. Similar sounding words can inadvertently be transcribed and may not be corrected upon review.   Xiara Knisley  K. November 14, 2014, 11:16 AM

## 2014-11-17 ENCOUNTER — Telehealth: Payer: Self-pay | Admitting: *Deleted

## 2014-11-17 ENCOUNTER — Ambulatory Visit (HOSPITAL_COMMUNITY)
Admission: RE | Admit: 2014-11-17 | Discharge: 2014-11-17 | Disposition: A | Discharge status: Home / Self Care | Payer: Medicare Other | Source: Ambulatory Visit | Attending: Internal Medicine | Admitting: Internal Medicine

## 2014-11-17 DIAGNOSIS — C3490 Malignant neoplasm of unspecified part of unspecified bronchus or lung: Secondary | ICD-10-CM | POA: Insufficient documentation

## 2014-11-17 DIAGNOSIS — R918 Other nonspecific abnormal finding of lung field: Secondary | ICD-10-CM

## 2014-11-17 MED ORDER — GADOBENATE DIMEGLUMINE 529 MG/ML IV SOLN
20.0000 mL | Freq: Once | INTRAVENOUS | Status: AC | PRN
  Administered 2014-11-17: 18 mL via INTRAVENOUS

## 2014-11-17 NOTE — Telephone Encounter (Signed)
Called patient to follow up and remind of appt for thoracic clinic on 11/20/14 arrive at 1:15.  I left vm message regarding appt.

## 2014-11-18 ENCOUNTER — Other Ambulatory Visit: Payer: Self-pay | Admitting: Radiology

## 2014-11-19 ENCOUNTER — Telehealth: Payer: Self-pay | Admitting: *Deleted

## 2014-11-19 ENCOUNTER — Ambulatory Visit (HOSPITAL_COMMUNITY)
Admission: RE | Admit: 2014-11-19 | Discharge: 2014-11-19 | Disposition: A | Discharge status: Home / Self Care | Payer: Medicare Other | Source: Ambulatory Visit | Attending: Interventional Radiology | Admitting: Interventional Radiology

## 2014-11-19 ENCOUNTER — Ambulatory Visit (HOSPITAL_COMMUNITY)
Admission: RE | Admit: 2014-11-19 | Discharge: 2014-11-19 | Disposition: A | Discharge status: Home / Self Care | Payer: Medicare Other | Source: Ambulatory Visit | Attending: Internal Medicine | Admitting: Internal Medicine

## 2014-11-19 DIAGNOSIS — Z9889 Other specified postprocedural states: Secondary | ICD-10-CM

## 2014-11-19 DIAGNOSIS — C7A1 Malignant poorly differentiated neuroendocrine tumors: Secondary | ICD-10-CM | POA: Insufficient documentation

## 2014-11-19 DIAGNOSIS — R918 Other nonspecific abnormal finding of lung field: Secondary | ICD-10-CM | POA: Diagnosis present

## 2014-11-19 DIAGNOSIS — Z87891 Personal history of nicotine dependence: Secondary | ICD-10-CM | POA: Insufficient documentation

## 2014-11-19 DIAGNOSIS — I10 Essential (primary) hypertension: Secondary | ICD-10-CM | POA: Insufficient documentation

## 2014-11-19 LAB — CBC
HCT: 41.4 % (ref 39.0–52.0)
Hemoglobin: 13.5 g/dL (ref 13.0–17.0)
MCH: 29.9 pg (ref 26.0–34.0)
MCHC: 32.6 g/dL (ref 30.0–36.0)
MCV: 91.8 fL (ref 78.0–100.0)
Platelets: 231 10*3/uL (ref 150–400)
RBC: 4.51 MIL/uL (ref 4.22–5.81)
RDW: 12.8 % (ref 11.5–15.5)
WBC: 8.1 10*3/uL (ref 4.0–10.5)

## 2014-11-19 LAB — APTT: aPTT: 30 seconds (ref 24–37)

## 2014-11-19 LAB — PROTIME-INR
INR: 1.07 (ref 0.00–1.49)
Prothrombin Time: 14 seconds (ref 11.6–15.2)

## 2014-11-19 MED ORDER — FENTANYL CITRATE 0.05 MG/ML IJ SOLN
INTRAMUSCULAR | Status: AC | PRN
  Administered 2014-11-19: 50 ug via INTRAVENOUS

## 2014-11-19 MED ORDER — LIDOCAINE HCL 1 % IJ SOLN
INTRAMUSCULAR | Status: AC
  Filled 2014-11-19: qty 20

## 2014-11-19 MED ORDER — MIDAZOLAM HCL 2 MG/2ML IJ SOLN
INTRAMUSCULAR | Status: AC | PRN
  Administered 2014-11-19: 1 mg via INTRAVENOUS

## 2014-11-19 MED ORDER — FENTANYL CITRATE 0.05 MG/ML IJ SOLN
INTRAMUSCULAR | Status: AC
  Filled 2014-11-19: qty 2

## 2014-11-19 MED ORDER — SODIUM CHLORIDE 0.9 % IV SOLN
INTRAVENOUS | Status: DC
  Administered 2014-11-19: 08:00:00 via INTRAVENOUS

## 2014-11-19 MED ORDER — MIDAZOLAM HCL 2 MG/2ML IJ SOLN
INTRAMUSCULAR | Status: AC
  Filled 2014-11-19: qty 4

## 2014-11-19 NOTE — Discharge Instructions (Signed)
Needle Biopsy of Lung, Care After °Refer to this sheet in the next few weeks. These instructions provide you with information on caring for yourself after your procedure. Your health care provider may also give you more specific instructions. Your treatment has been planned according to current medical practices, but problems sometimes occur. Call your health care provider if you have any problems or questions after your procedure. °WHAT TO EXPECT AFTER THE PROCEDURE °· A bandage will be applied over the area where the needle was inserted. You may be asked to apply pressure to the bandage for several minutes to ensure there is minimal bleeding. °· In most cases, you can leave when your needle biopsy procedure is completed. Do not drive yourself home. Someone else should take you home. °· If you received an IV sedative or general anesthetic, you will be taken to a comfortable place to relax while the medicine wears off. °· If you have upcoming travel scheduled, talk to your health care provider about when it is safe to travel by air after the procedure. °HOME CARE INSTRUCTIONS °· Expect to take it easy for the rest of the day. °· Protect the area where you received the needle biopsy by keeping the bandage in place for as long as instructed. °· You may feel some mild pain or discomfort in the area, but this should stop in a day or two. °· Take medicines only as directed by your health care provider. °SEEK MEDICAL CARE IF:  °· You have pain at the biopsy site that worsens or is not helped by medicine. °· You have swelling or drainage at the needle biopsy site. °· You have a fever. °SEEK IMMEDIATE MEDICAL CARE IF:  °· You have new or worsening shortness of breath. °· You have chest pain. °· You are coughing up blood. °· You have bleeding that does not stop with pressure or a bandage. °· You develop light-headedness or fainting. °Document Released: 05/22/2007 Document Revised: 12/09/2013 Document Reviewed:  12/17/2012 °[employer]® Patient Information ©2015 [employer]. This information is not intended to replace advice given to you by your health care provider. Make sure you discuss any questions you have with your health care provider. ° °

## 2014-11-19 NOTE — H&P (Signed)
Chief Complaint: "I'm having a lung biopsy"  Referring Physician(s): Mohamed,Mohamed  History of Present Illness: Travis Medina is a 81 y.o. male remote smoker with history of multiple pulmonary nodules bilaterally and 2 larger nodules in LUL which are hypermetabolic on recent PET scan. He presents today for CT guided biopsy of a LUL nodule.   Past Medical History  Diagnosis Date  . Essential hypertension     Past Surgical History  Procedure Laterality Date  . Total knee arthroplasty      Allergies: Morphine and related  Medications: Prior to Admission medications   Medication Sig Start Date End Date Taking? Authorizing Provider  acetaminophen (TYLENOL) 500 MG tablet Take 1 tablet (500 mg total) by mouth every 6 (six) hours as needed for mild pain. 10/25/14  Yes Florencia Reasons, MD  aspirin 325 MG tablet Take 325 mg by mouth daily.   Yes Historical Provider, MD  lisinopril (PRINIVIL,ZESTRIL) 5 MG tablet Take 5 mg by mouth daily.   Yes Historical Provider, MD  traMADol (ULTRAM) 50 MG tablet Take 50 mg by mouth every 12 (twelve) hours as needed for moderate pain.    Historical Provider, MD    Family History  Problem Relation Age of Onset  . Heart failure Mother   . Colon cancer Sister     History   Social History  . Marital Status: Married    Spouse Name: N/A  . Number of Children: N/A  . Years of Education: N/A   Social History Main Topics  . Smoking status: Former Smoker -- 1.00 packs/day for 25 years    Types: Cigarettes  . Smokeless tobacco: Not on file  . Alcohol Use: No  . Drug Use: No  . Sexual Activity: Not on file   Other Topics Concern  . Not on file   Social History Narrative      Review of Systems  Constitutional: Negative for fever and chills.  Respiratory: Negative for cough and shortness of breath.   Cardiovascular: Negative for chest pain.  Gastrointestinal: Negative for nausea, vomiting, abdominal pain and blood in stool.  Genitourinary:  Negative for dysuria and hematuria.  Musculoskeletal: Negative for back pain.       Hx recent rt femoral neck fracture  Neurological: Negative for headaches.    Vital Signs: BP 149/88 mmHg  Pulse 69  Temp(Src) 97.7 F (36.5 C) (Oral)  Resp 18  Ht 5\' 10"  (1.778 m)  Wt 200 lb (90.719 kg)  BMI 28.70 kg/m2  SpO2 100%  Physical Exam  Constitutional: He is oriented to person, place, and time. He appears well-developed and well-nourished.  Cardiovascular: Normal rate and regular rhythm.   Pulmonary/Chest: Effort normal and breath sounds normal.  Abdominal: Soft. Bowel sounds are normal. There is no tenderness.  Musculoskeletal: He exhibits no edema.  Neurological: He is alert and oriented to person, place, and time.    Imaging: Ct Abdomen Pelvis Wo Contrast  10/24/2014   CLINICAL DATA:  81 year old male with pathologic right femoral neck fracture. Evaluate for primary malignancy.  EXAM: CT CHEST, ABDOMEN AND PELVIS WITHOUT CONTRAST  TECHNIQUE: Multidetector CT imaging of the chest, abdomen and pelvis was performed following the standard protocol without IV contrast.  COMPARISON:  07/26/2010 abdominal and pelvic CT.  FINDINGS: CT CHEST FINDINGS  Mediastinum/Nodes: Coronary calcifications noted. Coronary and thoracic aortic calcifications noted. The heart and great vessels are otherwise unremarkable. No enlarged lymph nodes are identified. No pleural or pericardial effusions are present.  Lungs/Pleura:  Pulmonary nodules are identified as follows:  A 1.3 cm irregular nodule neck anterior left upper lobe (image 21).  A 1.3 cm irregular nodule within the left upper lobe (image 22)  A 3 mm ground-glass nodule in the left upper lobe (image 29  Several 2 mm subpleural nodules along the lateral new right upper lobe and along the lateral minor fissure) images 22-26)  Mild paraseptal emphysema is noted.  There is no evidence of airspace disease, consolidation or endobronchial/endotracheal lesion.   Musculoskeletal: No acute or suspicious bony abnormalities are identified.  CT ABDOMEN AND PELVIS FINDINGS  Please note that parenchymal abnormalities may be missed without intravenous contrast.  Hepatobiliary: The liver and gallbladder are unremarkable. There is no evidence of biliary dilatation.  Pancreas: Unremarkable  Spleen: Unremarkable  Adrenals/Urinary Tract: Bilateral renal atrophy again noted. There is no evidence of hydronephrosis or urinary calculi. The adrenal glands are unremarkable. Circumferential bladder wall thickening is again noted.  Stomach/Bowel: A small hiatal hernia is again identified. There is no evidence of bowel obstruction or focal wall thickening.  Vascular/Lymphatic: Prominent periportal nodes are unchanged from 2011. No suspicious enlarged lymph nodes or abdominal aortic aneurysm noted.  Reproductive: Prostate enlargement noted.  Other: No free fluid, pneumoperitoneum or focal collection identified.  Musculoskeletal: There is mild horizontal sclerosis within the right femoral neck medially. No definite fracture line is noted. The MR findings in the proximal right femur are significantly more impressive than the CT findings, and the right femoral neck findings compatible with a stress fracture rather than a pathologic fracture.  Compression fractures of T12 and L1 are again noted.  IMPRESSION: Mild sclerosis within the medial right femoral neck compatible with stress fracture (rather than pathologic fracture) when correlated with the MR.  Two separate 1.3 cm irregular nodules within the left upper lobe-suspicious for pulmonary malignancy. PET-CT is recommended for further evaluation.  Bilateral renal atrophy.  Mild emphysema and prostate enlargement.   Electronically Signed   By: Margarette Canada M.D.   On: 10/24/2014 19:43   Ct Chest Wo Contrast  10/24/2014   CLINICAL DATA:  81 year old male with pathologic right femoral neck fracture. Evaluate for primary malignancy.  EXAM: CT CHEST,  ABDOMEN AND PELVIS WITHOUT CONTRAST  TECHNIQUE: Multidetector CT imaging of the chest, abdomen and pelvis was performed following the standard protocol without IV contrast.  COMPARISON:  07/26/2010 abdominal and pelvic CT.  FINDINGS: CT CHEST FINDINGS  Mediastinum/Nodes: Coronary calcifications noted. Coronary and thoracic aortic calcifications noted. The heart and great vessels are otherwise unremarkable. No enlarged lymph nodes are identified. No pleural or pericardial effusions are present.  Lungs/Pleura:  Pulmonary nodules are identified as follows:  A 1.3 cm irregular nodule neck anterior left upper lobe (image 21).  A 1.3 cm irregular nodule within the left upper lobe (image 22)  A 3 mm ground-glass nodule in the left upper lobe (image 29  Several 2 mm subpleural nodules along the lateral new right upper lobe and along the lateral minor fissure) images 22-26)  Mild paraseptal emphysema is noted.  There is no evidence of airspace disease, consolidation or endobronchial/endotracheal lesion.  Musculoskeletal: No acute or suspicious bony abnormalities are identified.  CT ABDOMEN AND PELVIS FINDINGS  Please note that parenchymal abnormalities may be missed without intravenous contrast.  Hepatobiliary: The liver and gallbladder are unremarkable. There is no evidence of biliary dilatation.  Pancreas: Unremarkable  Spleen: Unremarkable  Adrenals/Urinary Tract: Bilateral renal atrophy again noted. There is no evidence of hydronephrosis or urinary  calculi. The adrenal glands are unremarkable. Circumferential bladder wall thickening is again noted.  Stomach/Bowel: A small hiatal hernia is again identified. There is no evidence of bowel obstruction or focal wall thickening.  Vascular/Lymphatic: Prominent periportal nodes are unchanged from 2011. No suspicious enlarged lymph nodes or abdominal aortic aneurysm noted.  Reproductive: Prostate enlargement noted.  Other: No free fluid, pneumoperitoneum or focal collection  identified.  Musculoskeletal: There is mild horizontal sclerosis within the right femoral neck medially. No definite fracture line is noted. The MR findings in the proximal right femur are significantly more impressive than the CT findings, and the right femoral neck findings compatible with a stress fracture rather than a pathologic fracture.  Compression fractures of T12 and L1 are again noted.  IMPRESSION: Mild sclerosis within the medial right femoral neck compatible with stress fracture (rather than pathologic fracture) when correlated with the MR.  Two separate 1.3 cm irregular nodules within the left upper lobe-suspicious for pulmonary malignancy. PET-CT is recommended for further evaluation.  Bilateral renal atrophy.  Mild emphysema and prostate enlargement.   Electronically Signed   By: Margarette Canada M.D.   On: 10/24/2014 19:43   Mr Jeri Cos KG Contrast  11/17/2014   CLINICAL DATA:  Staging. Non-small-cell lung cancer. No neurologic symptoms are reported. Initial encounter.  EXAM: MRI HEAD WITHOUT AND WITH CONTRAST  TECHNIQUE: Multiplanar, multiecho pulse sequences of the brain and surrounding structures were obtained without and with intravenous contrast.  CONTRAST:  20mL MULTIHANCE GADOBENATE DIMEGLUMINE 529 MG/ML IV SOLN  COMPARISON:  None.  FINDINGS: No evidence for acute infarction, hemorrhage, mass lesion, hydrocephalus, or extra-axial fluid. Generalized cerebral and cerebellar atrophy. Moderate T2 and FLAIR hyperintensities throughout the periventricular and subcortical white matter representing small vessel disease. Flow voids are maintained throughout the carotid, basilar, and vertebral arteries. There are no areas of chronic hemorrhage. Pituitary, pineal, and cerebellar tonsils unremarkable. No upper cervical lesions. Mild pannus surrounds the odontoid.  Post infusion, no abnormal enhancement of the brain or meninges. No intracranial metastatic disease is evident. Visualized calvarium, skull base,  and upper cervical osseous structures unremarkable. Scalp and extracranial soft tissues, orbits, sinuses, and mastoids show no acute process.  IMPRESSION: Chronic changes as described. No intracranial metastatic disease is evident.   Electronically Signed   By: Rolla Flatten M.D.   On: 11/17/2014 16:34   Mr Pelvis Wo Contrast  10/22/2014   CLINICAL DATA:  Anterior right hip pain, worse with weight-bearing.  EXAM: MR OF THE PELVIS AND RIGHT HIP WITHOUT CONTRAST  TECHNIQUE: Multiplanar, multisequence MR imaging was performed. No intravenous contrast was administered.  COMPARISON:  None.  FINDINGS: Bones: There is intense marrow edema in the right femoral neck extending into the proximal femoral diaphysis. Edema is most intense along the inferior 1/2 of the femoral neck where there is associated associated decreased T1 signal. Findings are consistent with fracture. The fracture may be pathologic as there appears to be cortical disruption along the posterior margin at the head - neck junction of the femur on axial images 12 through 14 of series 5. Bone marrow signal is otherwise unremarkable.  Articular cartilage and labrum  Articular cartilage: Mild bilateral hip degenerative disease is seen.  Labrum: The superior labrum of the right hip is diffusely degenerated and torn.  Joint or bursal effusion  Joint effusion:  Small right hip joint effusion is seen.  Bursae:  Unremarkable.  Muscles and tendons  Muscles and tendons:  Intact.  Other findings  Miscellaneous: Imaged  intrapelvic contents demonstrate mild enlargement of the prostate gland.  IMPRESSION: Right femoral neck fracture with findings worrisome for pathologic fracture with cortical disruption of the posterior head neck margin identified as described above.  Mild to moderate right hip osteoarthritis with associated degenerative tearing of the labrum.  Findings called to Biagio Borg, P.A., at the time of interpretation.   Electronically Signed   By: Inge Rise M.D.   On: 10/22/2014 12:29   Mr Hip Right Wo Contrast  10/22/2014   CLINICAL DATA:  Anterior right hip pain, worse with weight-bearing.  EXAM: MR OF THE PELVIS AND RIGHT HIP WITHOUT CONTRAST  TECHNIQUE: Multiplanar, multisequence MR imaging was performed. No intravenous contrast was administered.  COMPARISON:  None.  FINDINGS: Bones: There is intense marrow edema in the right femoral neck extending into the proximal femoral diaphysis. Edema is most intense along the inferior 1/2 of the femoral neck where there is associated associated decreased T1 signal. Findings are consistent with fracture. The fracture may be pathologic as there appears to be cortical disruption along the posterior margin at the head - neck junction of the femur on axial images 12 through 14 of series 5. Bone marrow signal is otherwise unremarkable.  Articular cartilage and labrum  Articular cartilage: Mild bilateral hip degenerative disease is seen.  Labrum: The superior labrum of the right hip is diffusely degenerated and torn.  Joint or bursal effusion  Joint effusion:  Small right hip joint effusion is seen.  Bursae:  Unremarkable.  Muscles and tendons  Muscles and tendons:  Intact.  Other findings  Miscellaneous: Imaged intrapelvic contents demonstrate mild enlargement of the prostate gland.  IMPRESSION: Right femoral neck fracture with findings worrisome for pathologic fracture with cortical disruption of the posterior head neck margin identified as described above.  Mild to moderate right hip osteoarthritis with associated degenerative tearing of the labrum.  Findings called to Biagio Borg, P.A., at the time of interpretation.   Electronically Signed   By: Inge Rise M.D.   On: 10/22/2014 12:29   Nm Pet Image Initial (pi) Skull Base To Thigh  11/06/2014   CLINICAL DATA:  Initial treatment strategy for left upper lobe lung nodules.  EXAM: NUCLEAR MEDICINE PET SKULL BASE TO THIGH  TECHNIQUE: 9.8 mCi F-18 FDG was  injected intravenously. Full-ring PET imaging was performed from the skull base to thigh after the radiotracer. CT data was obtained and used for attenuation correction and anatomic localization.  FASTING BLOOD GLUCOSE:  Value: 108 mg/dl  COMPARISON:  Chest CT 10/24/2014.  FINDINGS: NECK  No hypermetabolic lymph nodes in the neck.  CHEST  Two left upper lobe pulmonary nodules are again noted. One of these is anterior and in contact with the pleura measuring 11 x 14 mm (image 36 of series 6), and is hypermetabolic (SUVmax = 4.4). The other is medially located in the left upper lobe (image 37 of series 6) measuring 11 x 14 mm, and is also hypermetabolic (SUVmax = 8.6). Another 5 mm left upper lobe nodule is noted (image 40 of series 6), unchanged in retrospect compared to the prior examination, and not hypermetabolic (likely below the resolution of PET imaging). Several other smaller pulmonary nodules are also noted measuring 2-4 mm in size scattered throughout the lungs bilaterally. Although there are no enlarged mediastinal or hilar lymph nodes, there are borderline enlarged AP window and low right paratracheal lymph nodes, both of which measured 8 mm in short axis, which demonstrate low-level metabolic activity (  SUVmax = 3.3 and 2.7 respectively), concerning for potential nodal involvement. Heart size is normal. There is no significant pericardial fluid, thickening or pericardial calcification. There is atherosclerosis of the thoracic aorta, the great vessels of the mediastinum and the coronary arteries, including calcified atherosclerotic plaque in the left main and left anterior descending coronary arteries. Mild paraseptal emphysema.  ABDOMEN/PELVIS  No abnormal hypermetabolic activity within the liver, pancreas, adrenal glands, or spleen. No hypermetabolic lymph nodes in the abdomen or pelvis. No significant volume of ascites. No pathologic distention of small bowel or colon. Extensive atherosclerosis  throughout the abdominal aorta and major pelvic vasculature, without definite aneurysm. Small bilateral hydroceles noted scrotum.  SKELETON  Hypermetabolism in the right femoral neck, compatible with a healing nondisplaced fracture. No definite aggressive osseous lesion in this region to suggest presence of a pathologic fracture. There is also a small focus of hypermetabolism in the intercostal musculature between the lateral left third and fourth ribs, with some adjacent similar low-level hypermetabolism in other left-sided intercostal muscles, favored to be physiologic related to recent muscular strain. No discrete soft tissue or osseous lesion is noted in this region to suggest the presence of a metastatic focus. No focal hypermetabolic activity to suggest skeletal metastasis.  IMPRESSION: 1. Multiple pulmonary nodules in the lungs bilaterally, the majority of which are nonspecific. There are 2 larger pulmonary nodules in the left upper lobe which are hypermetabolic, as discussed above. These are concerning for primary bronchogenic neoplasm, potentially 2 synchronous lesions. In addition, there are nonenlarged but hypermetabolic AP window and right paratracheal lymph nodes, which are nonspecific but concerning for potential nodal involvement. Multiple other smaller pulmonary nodules in the lungs bilaterally are highly nonspecific and warrant attention on followup studies. Correlation with biopsy is recommended at this time to establish a tissue diagnosis. 2. Healing nondisplaced right femoral neck fracture, without features to suggest a pathologic fracture. 3. Additional incidental findings, as above.   Electronically Signed   By: Vinnie Langton M.D.   On: 11/06/2014 09:37   Dg Chest Port 1 View  10/24/2014   CLINICAL DATA:  Essential hypertension  EXAM: PORTABLE CHEST - 1 VIEW  COMPARISON:  None.  FINDINGS: The heart size and mediastinal contours are within normal limits. Both lungs are clear. The  visualized skeletal structures are unremarkable. Mild apical scarring bilaterally.  IMPRESSION: No active disease.   Electronically Signed   By: Franchot Gallo M.D.   On: 10/24/2014 09:09    Labs:  CBC:  Recent Labs  10/23/14 2230 10/24/14 0509 10/25/14 0551 11/19/14 0736  WBC 8.7 9.4 9.2 8.1  HGB 12.9* 13.4 13.7 13.5  HCT 38.4* 40.7 41.5 41.4  PLT 241 250 267 231    COAGS:  Recent Labs  10/23/14 2230 11/19/14 0736  INR 1.06 1.07  APTT  --  30    BMP:  Recent Labs  10/23/14 2230 10/24/14 0509 10/25/14 0551  NA 136 135 134*  K 4.6 4.9 4.9  CL 104 104 101  CO2 27 26 27   GLUCOSE 164* 113* 110*  BUN 30* 29* 27*  CALCIUM 9.3 9.5 10.0  CREATININE 1.50* 1.51* 1.41*  GFRNONAA 43* 42* 46*  GFRAA 50* 49* 54*    LIVER FUNCTION TESTS:  Recent Labs  10/23/14 2230  BILITOT 0.5  AST 24  ALT 32  ALKPHOS 80  PROT 6.7  ALBUMIN 3.6    TUMOR MARKERS: No results for input(s): AFPTM, CEA, CA199, CHROMGRNA in the last 8760 hours.  Assessment  and Plan: Travis Medina is a 80 y.o. male remote smoker with history of multiple pulmonary nodules bilaterally and 2 larger nodules in LUL which are hypermetabolic on recent PET scan. He presents today for CT guided biopsy of a LUL nodule. Risks and benefits discussed with the patient/wife including, but not limited to bleeding, infection, pneumothorax, damage to adjacent structures or low yield requiring additional tests and death. All of the patient's questions were answered, patient is agreeable to proceed. Consent signed and in chart.        Signed: Autumn Messing 11/19/2014, 8:32 AM   I spent a total of 20 minutes face to face in clinical consultation, greater than 50% of which was counseling/coordinating care for CT guided left lung mass biopsy

## 2014-11-19 NOTE — Procedures (Signed)
LUL lung Bx 18 g core times 4 No comp

## 2014-11-19 NOTE — Sedation Documentation (Signed)
Bandaid L upper chest intact

## 2014-11-19 NOTE — Progress Notes (Signed)
Per Stacie Glaze ok to go to xray via wheelchair

## 2014-11-19 NOTE — Telephone Encounter (Signed)
Called and reminded pt of appt 11/20/14.

## 2014-11-20 ENCOUNTER — Ambulatory Visit: Payer: Medicare Other | Admitting: Physical Therapy

## 2014-11-20 ENCOUNTER — Encounter: Payer: Self-pay | Admitting: Internal Medicine

## 2014-11-20 ENCOUNTER — Ambulatory Visit (HOSPITAL_BASED_OUTPATIENT_CLINIC_OR_DEPARTMENT_OTHER): Payer: Medicare Other | Admitting: Internal Medicine

## 2014-11-20 ENCOUNTER — Ambulatory Visit
Admission: RE | Admit: 2014-11-20 | Discharge: 2014-11-20 | Disposition: A | Discharge status: Home / Self Care | Payer: Medicare Other | Source: Ambulatory Visit | Attending: Radiation Oncology | Admitting: Radiation Oncology

## 2014-11-20 ENCOUNTER — Telehealth: Payer: Self-pay | Admitting: Internal Medicine

## 2014-11-20 ENCOUNTER — Encounter: Payer: Self-pay | Admitting: *Deleted

## 2014-11-20 VITALS — BP 141/78 | HR 99 | Temp 98.2°F | Resp 18 | Ht 70.0 in | Wt 198.3 lb

## 2014-11-20 DIAGNOSIS — C3412 Malignant neoplasm of upper lobe, left bronchus or lung: Secondary | ICD-10-CM | POA: Diagnosis not present

## 2014-11-20 NOTE — Telephone Encounter (Signed)
avs printed for patient

## 2014-11-20 NOTE — Progress Notes (Signed)
[place] [phone]   [phone] Multidisciplinary thoracic oncology clinic  OFFICE PROGRESS NOTE  [name], MD [place] [address]  DIAGNOSIS: Limited stage (T3, N2, M0) small cell lung cancer diagnosed in February 2016 presenting with 2 pulmonary nodules in the left upper lobe as well as questionable mediastinal lymphadenopathy.  PRIOR THERAPY: None  CURRENT THERAPY: None  INTERVAL HISTORY: [place] [address] 81 y.o. male returns to the clinic today for follow-up visit accompanied by his wife. The patient is feeling fine with no specific complaints today except for the pain in the right hip area from stress fracture of the right femur. The patient recently underwent CT-guided core biopsy of the left upper lobe lung mass by interventional radiology and the final pathology reported later today was consistent with a high-grade neuroendocrine carcinoma, small cell type. The patient also had MRI of the brain that showed no evidence for metastatic disease to the brain. He is here today for evaluation and discussion of his treatment options. He denied having any significant chest pain, shortness of breath, cough or hemoptysis. The patient denied having any significant nausea or vomiting, no fever or chills. He has no significant weight loss or night sweats.  MEDICAL HISTORY: Past Medical History  Diagnosis Date  . Essential hypertension     ALLERGIES:  is allergic to morphine and related.  MEDICATIONS:  Current Outpatient Prescriptions  Medication Sig Dispense Refill  . acetaminophen (TYLENOL) 500 MG tablet Take 1 tablet (500 mg total) by mouth every 6 (six) hours as needed for mild pain. 30 tablet 0  . aspirin 325 MG tablet Take 325 mg by mouth daily.    [name] lisinopril (PRINIVIL,ZESTRIL) 5 MG tablet Take 5 mg by mouth daily.    . traMADol (ULTRAM) 50 MG tablet Take 50 mg by mouth every 12 (twelve) hours as needed for moderate  pain.     No current facility-administered medications for this visit.    SURGICAL HISTORY:  Past Surgical History  Procedure Laterality Date  . Total knee arthroplasty      REVIEW OF SYSTEMS:  Constitutional: negative Eyes: negative Ears, nose, mouth, throat, and face: negative Respiratory: negative Cardiovascular: negative Gastrointestinal: negative Genitourinary:negative Integument/breast: negative Hematologic/lymphatic: negative Musculoskeletal:positive for bone pain Neurological: negative Behavioral/Psych: negative Endocrine: negative Allergic/Immunologic: negative   PHYSICAL EXAMINATION: General appearance: alert, cooperative and no distress Head: Normocephalic, without obvious abnormality, atraumatic Neck: no adenopathy, no JVD, supple, symmetrical, trachea midline and thyroid not enlarged, symmetric, no tenderness/mass/nodules Lymph nodes: Cervical, supraclavicular, and axillary nodes normal. Resp: clear to auscultation bilaterally Back: symmetric, no curvature. ROM normal. No CVA tenderness. Cardio: regular rate and rhythm, S1, S2 normal, no murmur, click, rub or gallop GI: soft, non-tender; bowel sounds normal; no masses,  no organomegaly Extremities: extremities normal, atraumatic, no cyanosis or edema Neurologic: Alert and oriented X 3, normal strength and tone. Normal symmetric reflexes. Normal coordination and gait  ECOG PERFORMANCE STATUS: 1 - Symptomatic but completely ambulatory  There were no vitals taken for this visit.  LABORATORY DATA: Lab Results  Component Value Date   WBC 8.1 11/19/2014   HGB 13.5 11/19/2014   HCT 41.4 11/19/2014   MCV 91.8 11/19/2014   PLT 231 11/19/2014      Chemistry      Component Value Date/Time   NA 134* 10/25/2014 0551   K 4.9 10/25/2014 0551   CL 101 10/25/2014 0551   CO2 27 10/25/2014 0551  BUN 27* 10/25/2014 0551   CREATININE 1.41* 10/25/2014 0551      Component Value Date/Time   CALCIUM 10.0 10/25/2014  0551   ALKPHOS 80 10/23/2014 2230   AST 24 10/23/2014 2230   ALT 32 10/23/2014 2230   BILITOT 0.5 10/23/2014 2230       RADIOGRAPHIC STUDIES: Ct Abdomen Pelvis Wo Contrast  10/24/2014   CLINICAL DATA:  81 year old male with pathologic right femoral neck fracture. Evaluate for primary malignancy.  EXAM: CT CHEST, ABDOMEN AND PELVIS WITHOUT CONTRAST  TECHNIQUE: Multidetector CT imaging of the chest, abdomen and pelvis was performed following the standard protocol without IV contrast.  COMPARISON:  07/26/2010 abdominal and pelvic CT.  FINDINGS: CT CHEST FINDINGS  Mediastinum/Nodes: Coronary calcifications noted. Coronary and thoracic aortic calcifications noted. The heart and great vessels are otherwise unremarkable. No enlarged lymph nodes are identified. No pleural or pericardial effusions are present.  Lungs/Pleura:  Pulmonary nodules are identified as follows:  A 1.3 cm irregular nodule neck anterior left upper lobe (image 21).  A 1.3 cm irregular nodule within the left upper lobe (image 22)  A 3 mm ground-glass nodule in the left upper lobe (image 29  Several 2 mm subpleural nodules along the lateral new right upper lobe and along the lateral minor fissure) images 22-26)  Mild paraseptal emphysema is noted.  There is no evidence of airspace disease, consolidation or endobronchial/endotracheal lesion.  Musculoskeletal: No acute or suspicious bony abnormalities are identified.  CT ABDOMEN AND PELVIS FINDINGS  Please note that parenchymal abnormalities may be missed without intravenous contrast.  Hepatobiliary: The liver and gallbladder are unremarkable. There is no evidence of biliary dilatation.  Pancreas: Unremarkable  Spleen: Unremarkable  Adrenals/Urinary Tract: Bilateral renal atrophy again noted. There is no evidence of hydronephrosis or urinary calculi. The adrenal glands are unremarkable. Circumferential bladder wall thickening is again noted.  Stomach/Bowel: A small hiatal hernia is again  identified. There is no evidence of bowel obstruction or focal wall thickening.  Vascular/Lymphatic: Prominent periportal nodes are unchanged from 2011. No suspicious enlarged lymph nodes or abdominal aortic aneurysm noted.  Reproductive: Prostate enlargement noted.  Other: No free fluid, pneumoperitoneum or focal collection identified.  Musculoskeletal: There is mild horizontal sclerosis within the right femoral neck medially. No definite fracture line is noted. The MR findings in the proximal right femur are significantly more impressive than the CT findings, and the right femoral neck findings compatible with a stress fracture rather than a pathologic fracture.  Compression fractures of T12 and L1 are again noted.  IMPRESSION: Mild sclerosis within the medial right femoral neck compatible with stress fracture (rather than pathologic fracture) when correlated with the MR.  Two separate 1.3 cm irregular nodules within the left upper lobe-suspicious for pulmonary malignancy. PET-CT is recommended for further evaluation.  Bilateral renal atrophy.  Mild emphysema and prostate enlargement.   Electronically Signed   By: Margarette Canada M.D.   On: 10/24/2014 19:43   Dg Chest 1 View  11/19/2014   CLINICAL DATA:  Post biopsy  EXAM: CHEST  1 VIEW  COMPARISON:  10/24/2014  FINDINGS: No pneumothorax. Low volumes. Bibasilar atelectasis. Normal heart size.  IMPRESSION: No pneumothorax.   Electronically Signed   By: Marybelle Killings M.D.   On: 11/19/2014 12:30   Ct Chest Wo Contrast  10/24/2014   CLINICAL DATA:  81 year old male with pathologic right femoral neck fracture. Evaluate for primary malignancy.  EXAM: CT CHEST, ABDOMEN AND PELVIS WITHOUT CONTRAST  TECHNIQUE: Multidetector CT  imaging of the chest, abdomen and pelvis was performed following the standard protocol without IV contrast.  COMPARISON:  07/26/2010 abdominal and pelvic CT.  FINDINGS: CT CHEST FINDINGS  Mediastinum/Nodes: Coronary calcifications noted. Coronary  and thoracic aortic calcifications noted. The heart and great vessels are otherwise unremarkable. No enlarged lymph nodes are identified. No pleural or pericardial effusions are present.  Lungs/Pleura:  Pulmonary nodules are identified as follows:  A 1.3 cm irregular nodule neck anterior left upper lobe (image 21).  A 1.3 cm irregular nodule within the left upper lobe (image 22)  A 3 mm ground-glass nodule in the left upper lobe (image 29  Several 2 mm subpleural nodules along the lateral new right upper lobe and along the lateral minor fissure) images 22-26)  Mild paraseptal emphysema is noted.  There is no evidence of airspace disease, consolidation or endobronchial/endotracheal lesion.  Musculoskeletal: No acute or suspicious bony abnormalities are identified.  CT ABDOMEN AND PELVIS FINDINGS  Please note that parenchymal abnormalities may be missed without intravenous contrast.  Hepatobiliary: The liver and gallbladder are unremarkable. There is no evidence of biliary dilatation.  Pancreas: Unremarkable  Spleen: Unremarkable  Adrenals/Urinary Tract: Bilateral renal atrophy again noted. There is no evidence of hydronephrosis or urinary calculi. The adrenal glands are unremarkable. Circumferential bladder wall thickening is again noted.  Stomach/Bowel: A small hiatal hernia is again identified. There is no evidence of bowel obstruction or focal wall thickening.  Vascular/Lymphatic: Prominent periportal nodes are unchanged from 2011. No suspicious enlarged lymph nodes or abdominal aortic aneurysm noted.  Reproductive: Prostate enlargement noted.  Other: No free fluid, pneumoperitoneum or focal collection identified.  Musculoskeletal: There is mild horizontal sclerosis within the right femoral neck medially. No definite fracture line is noted. The MR findings in the proximal right femur are significantly more impressive than the CT findings, and the right femoral neck findings compatible with a stress fracture rather  than a pathologic fracture.  Compression fractures of T12 and L1 are again noted.  IMPRESSION: Mild sclerosis within the medial right femoral neck compatible with stress fracture (rather than pathologic fracture) when correlated with the MR.  Two separate 1.3 cm irregular nodules within the left upper lobe-suspicious for pulmonary malignancy. PET-CT is recommended for further evaluation.  Bilateral renal atrophy.  Mild emphysema and prostate enlargement.   Electronically Signed   By: Margarette Canada M.D.   On: 10/24/2014 19:43   Mr Jeri Cos OJ Contrast  11/17/2014   CLINICAL DATA:  Staging. Non-small-cell lung cancer. No neurologic symptoms are reported. Initial encounter.  EXAM: MRI HEAD WITHOUT AND WITH CONTRAST  TECHNIQUE: Multiplanar, multiecho pulse sequences of the brain and surrounding structures were obtained without and with intravenous contrast.  CONTRAST:  20mL MULTIHANCE GADOBENATE DIMEGLUMINE 529 MG/ML IV SOLN  COMPARISON:  None.  FINDINGS: No evidence for acute infarction, hemorrhage, mass lesion, hydrocephalus, or extra-axial fluid. Generalized cerebral and cerebellar atrophy. Moderate T2 and FLAIR hyperintensities throughout the periventricular and subcortical white matter representing small vessel disease. Flow voids are maintained throughout the carotid, basilar, and vertebral arteries. There are no areas of chronic hemorrhage. Pituitary, pineal, and cerebellar tonsils unremarkable. No upper cervical lesions. Mild pannus surrounds the odontoid.  Post infusion, no abnormal enhancement of the brain or meninges. No intracranial metastatic disease is evident. Visualized calvarium, skull base, and upper cervical osseous structures unremarkable. Scalp and extracranial soft tissues, orbits, sinuses, and mastoids show no acute process.  IMPRESSION: Chronic changes as described. No intracranial metastatic disease is evident.  Electronically Signed   By: Rolla Flatten M.D.   On: 11/17/2014 16:34   Mr Pelvis  Wo Contrast  10/22/2014   CLINICAL DATA:  Anterior right hip pain, worse with weight-bearing.  EXAM: MR OF THE PELVIS AND RIGHT HIP WITHOUT CONTRAST  TECHNIQUE: Multiplanar, multisequence MR imaging was performed. No intravenous contrast was administered.  COMPARISON:  None.  FINDINGS: Bones: There is intense marrow edema in the right femoral neck extending into the proximal femoral diaphysis. Edema is most intense along the inferior 1/2 of the femoral neck where there is associated associated decreased T1 signal. Findings are consistent with fracture. The fracture may be pathologic as there appears to be cortical disruption along the posterior margin at the head - neck junction of the femur on axial images 12 through 14 of series 5. Bone marrow signal is otherwise unremarkable.  Articular cartilage and labrum  Articular cartilage: Mild bilateral hip degenerative disease is seen.  Labrum: The superior labrum of the right hip is diffusely degenerated and torn.  Joint or bursal effusion  Joint effusion:  Small right hip joint effusion is seen.  Bursae:  Unremarkable.  Muscles and tendons  Muscles and tendons:  Intact.  Other findings  Miscellaneous: Imaged intrapelvic contents demonstrate mild enlargement of the prostate gland.  IMPRESSION: Right femoral neck fracture with findings worrisome for pathologic fracture with cortical disruption of the posterior head neck margin identified as described above.  Mild to moderate right hip osteoarthritis with associated degenerative tearing of the labrum.  Findings called to Biagio Borg, P.A., at the time of interpretation.   Electronically Signed   By: Inge Rise M.D.   On: 10/22/2014 12:29   Mr Hip Right Wo Contrast  10/22/2014   CLINICAL DATA:  Anterior right hip pain, worse with weight-bearing.  EXAM: MR OF THE PELVIS AND RIGHT HIP WITHOUT CONTRAST  TECHNIQUE: Multiplanar, multisequence MR imaging was performed. No intravenous contrast was administered.   COMPARISON:  None.  FINDINGS: Bones: There is intense marrow edema in the right femoral neck extending into the proximal femoral diaphysis. Edema is most intense along the inferior 1/2 of the femoral neck where there is associated associated decreased T1 signal. Findings are consistent with fracture. The fracture may be pathologic as there appears to be cortical disruption along the posterior margin at the head - neck junction of the femur on axial images 12 through 14 of series 5. Bone marrow signal is otherwise unremarkable.  Articular cartilage and labrum  Articular cartilage: Mild bilateral hip degenerative disease is seen.  Labrum: The superior labrum of the right hip is diffusely degenerated and torn.  Joint or bursal effusion  Joint effusion:  Small right hip joint effusion is seen.  Bursae:  Unremarkable.  Muscles and tendons  Muscles and tendons:  Intact.  Other findings  Miscellaneous: Imaged intrapelvic contents demonstrate mild enlargement of the prostate gland.  IMPRESSION: Right femoral neck fracture with findings worrisome for pathologic fracture with cortical disruption of the posterior head neck margin identified as described above.  Mild to moderate right hip osteoarthritis with associated degenerative tearing of the labrum.  Findings called to Biagio Borg, P.A., at the time of interpretation.   Electronically Signed   By: Inge Rise M.D.   On: 10/22/2014 12:29   Nm Pet Image Initial (pi) Skull Base To Thigh  11/06/2014   CLINICAL DATA:  Initial treatment strategy for left upper lobe lung nodules.  EXAM: NUCLEAR MEDICINE PET SKULL BASE TO THIGH  TECHNIQUE: 9.8 mCi F-18 FDG was injected intravenously. Full-ring PET imaging was performed from the skull base to thigh after the radiotracer. CT data was obtained and used for attenuation correction and anatomic localization.  FASTING BLOOD GLUCOSE:  Value: 108 mg/dl  COMPARISON:  Chest CT 10/24/2014.  FINDINGS: NECK  No hypermetabolic lymph  nodes in the neck.  CHEST  Two left upper lobe pulmonary nodules are again noted. One of these is anterior and in contact with the pleura measuring 11 x 14 mm (image 36 of series 6), and is hypermetabolic (SUVmax = 4.4). The other is medially located in the left upper lobe (image 37 of series 6) measuring 11 x 14 mm, and is also hypermetabolic (SUVmax = 8.6). Another 5 mm left upper lobe nodule is noted (image 40 of series 6), unchanged in retrospect compared to the prior examination, and not hypermetabolic (likely below the resolution of PET imaging). Several other smaller pulmonary nodules are also noted measuring 2-4 mm in size scattered throughout the lungs bilaterally. Although there are no enlarged mediastinal or hilar lymph nodes, there are borderline enlarged AP window and low right paratracheal lymph nodes, both of which measured 8 mm in short axis, which demonstrate low-level metabolic activity (SUVmax = 3.3 and 2.7 respectively), concerning for potential nodal involvement. Heart size is normal. There is no significant pericardial fluid, thickening or pericardial calcification. There is atherosclerosis of the thoracic aorta, the great vessels of the mediastinum and the coronary arteries, including calcified atherosclerotic plaque in the left main and left anterior descending coronary arteries. Mild paraseptal emphysema.  ABDOMEN/PELVIS  No abnormal hypermetabolic activity within the liver, pancreas, adrenal glands, or spleen. No hypermetabolic lymph nodes in the abdomen or pelvis. No significant volume of ascites. No pathologic distention of small bowel or colon. Extensive atherosclerosis throughout the abdominal aorta and major pelvic vasculature, without definite aneurysm. Small bilateral hydroceles noted scrotum.  SKELETON  Hypermetabolism in the right femoral neck, compatible with a healing nondisplaced fracture. No definite aggressive osseous lesion in this region to suggest presence of a pathologic  fracture. There is also a small focus of hypermetabolism in the intercostal musculature between the lateral left third and fourth ribs, with some adjacent similar low-level hypermetabolism in other left-sided intercostal muscles, favored to be physiologic related to recent muscular strain. No discrete soft tissue or osseous lesion is noted in this region to suggest the presence of a metastatic focus. No focal hypermetabolic activity to suggest skeletal metastasis.  IMPRESSION: 1. Multiple pulmonary nodules in the lungs bilaterally, the majority of which are nonspecific. There are 2 larger pulmonary nodules in the left upper lobe which are hypermetabolic, as discussed above. These are concerning for primary bronchogenic neoplasm, potentially 2 synchronous lesions. In addition, there are nonenlarged but hypermetabolic AP window and right paratracheal lymph nodes, which are nonspecific but concerning for potential nodal involvement. Multiple other smaller pulmonary nodules in the lungs bilaterally are highly nonspecific and warrant attention on followup studies. Correlation with biopsy is recommended at this time to establish a tissue diagnosis. 2. Healing nondisplaced right femoral neck fracture, without features to suggest a pathologic fracture. 3. Additional incidental findings, as above.   Electronically Signed   By: Vinnie Langton M.D.   On: 11/06/2014 09:37   Ct Biopsy  11/19/2014   CLINICAL DATA:  Left upper lobe lung nodules  EXAM: CT-GUIDED BIOPSY OF A LEFT UPPER LOBE LUNG NODULE.  CORE.  MEDICATIONS AND MEDICAL HISTORY: Versed 1 mg, Fentanyl 50 mcg.  Additional Medications: None.  ANESTHESIA/SEDATION: Moderate sedation time: 15 minutes  PROCEDURE: The procedure, risks, benefits, and alternatives were explained to the patient. Questions regarding the procedure were encouraged and answered. The patient understands and consents to the procedure.  The left upper anterior thorax was prepped with Betadine in a  sterile fashion, and a sterile drape was applied covering the operative field. A sterile gown and sterile gloves were used for the procedure.  Under CT guidance, a(n) 17 gauge guide needle was advanced into the left upper lobe lung nodule. Subsequently 4 18 gauge core biopsies were obtained. The guide needle was removed. Final imaging was performed.  Patient tolerated the procedure well without complication. Vital sign monitoring by nursing staff during the procedure will continue as patient is in the special procedures unit for post procedure observation.  FINDINGS: The images document guide needle placement within the left upper lobe lung nodule. Post biopsy images demonstrate no pneumothorax.  COMPLICATIONS: None  IMPRESSION: Successful CT-guided core biopsy of a left upper lobe lung nodule.   Electronically Signed   By: Marybelle Killings M.D.   On: 11/19/2014 10:32   Dg Chest Port 1 View  10/24/2014   CLINICAL DATA:  Essential hypertension  EXAM: PORTABLE CHEST - 1 VIEW  COMPARISON:  None.  FINDINGS: The heart size and mediastinal contours are within normal limits. Both lungs are clear. The visualized skeletal structures are unremarkable. Mild apical scarring bilaterally.  IMPRESSION: No active disease.   Electronically Signed   By: Franchot Gallo M.D.   On: 10/24/2014 09:09    ASSESSMENT AND PLAN: This is a very pleasant 81 years old white male recently diagnosed with limited stage small cell lung cancer presented with a 2 left upper lobe pulmonary nodules as well as questionable mediastinal lymphadenopathy. Unfortunately the final pathology was available after the patient left the clinic. I discussed with him the results of the MRI of the brain. I will arrange for the patient to come back for follow-up visit in one week for reevaluation and more detailed discussion of his treatment options based on the new findings from the pathology report. The patient may also benefit from concurrent radiotherapy and he  will be seen later today by Dr. Tammi Klippel for discussion of this option. I will consider him for treatment with systemic chemotherapy with carboplatin for AUC of 5 on day 1 and etoposide 120 MG/M2 on days 1, 2 and 3 with Neulasta support on day 4. This will be discussed in more detail with the patient in the upcoming visit. The patient was seen during the multidisciplinary thoracic oncology clinic today by medical oncology, radiation oncology, social worker, thoracic navigator as well as physical therapist. He was advised to call immediately if he has any concerning symptoms in the interval. The patient voices understanding of current disease status and treatment options and is in agreement with the current care plan.  All questions were answered. The patient knows to call the clinic with any problems, questions or concerns. We can certainly see the patient much sooner if necessary.  I spent 15 minutes counseling the patient face to face. The total time spent in the appointment was 25 minutes.  Disclaimer: This note was dictated with voice recognition software. Similar sounding words can inadvertently be transcribed and may not be corrected upon review.

## 2014-11-20 NOTE — Progress Notes (Signed)
Radiation Oncology         ([phone]) [phone] ________________________________  Multidisciplinary Thoracic Oncology Clinic [facility]) Initial Outpatient Consultation  Name: Travis Medina MRN: [id number]  Date: 11/20/2014  DOB: 04-24-1934  UK:GURKY,HCWC M, MD  Shon Baton, MD   REFERRING PHYSICIAN: Shon Baton, MD  DIAGNOSIS: The encounter diagnosis was Primary cancer of left upper lobe of lung.    ICD-9-CM ICD-10-CM   1. Primary cancer of left upper lobe of lung 162.3 C34.12     HISTORY OF PRESENT ILLNESS::Travis Medina is a 81 y.o. male.  The patient has been complaining of right hip pain. He was seen by Dr. Joni Fears for evaluation and MRI of the right hip and pelvis were performed on 10/22/2014 and it showed right femoral neck fracture with findings worrisome for pathologic fracture with cortical disruption of the posterior neck margin. Because of the concern about metastatic disease the patient was seen by his primary care physician Dr. Virgina Jock and CT scan of the chest, abdomen and pelvis were performed on 10/24/2014 and it showed 2 separate 1.3 cm irregular nodules within the left upper lobe suspicious for primary malignancy. A PET scan was performed on 11/06/2014 and it showed Two left upper lobe pulmonary nodules are again noted. One of these is anterior and in contact with the pleura measuring 11 x 14 mm, and is hypermetabolic (SUVmax = 4.4). The other is medially located in the left upper lobe measuring 11 x 14 mm, and is also hypermetabolic (SUVmax = 8.6). Another 5 mm left upper lobe nodule is noted, unchanged in retrospect compared to the prior examination, and not hypermetabolic (likely below the resolution of PET imaging). Several other smaller pulmonary nodules are also noted measuring 2-4 mm in size scattered throughout the lungs bilaterally. These are concerning for primary bronchogenic neoplasm, potentially 2 synchronous lesions. In addition, there are nonenlarged hypermetabolic AP  window and right paratracheal lymph nodes, which are nonspecific but concerning for potential nodal involvement.   When seen today the patient is feeling fine with no specific complaints. He is still unable to bear weight on the right hip after his hip fracture. The patient denied having any significant weight loss or night sweats. He denied having any chest pain, shortness breath, cough or hemoptysis. No significant nausea or vomiting. He has no headache or visual changes.   PREVIOUS RADIATION THERAPY: No  PAST MEDICAL HISTORY:  has a past medical history of Essential hypertension.    PAST SURGICAL HISTORY: Past Surgical History  Procedure Laterality Date  . Total knee arthroplasty      FAMILY HISTORY: family history includes Colon cancer in his sister; Heart failure in his mother.  SOCIAL HISTORY:  reports that he has quit smoking. His smoking use included Cigarettes. He has a 25 pack-year smoking history. He does not have any smokeless tobacco history on file. He reports that he does not drink alcohol or use illicit drugs.  ALLERGIES: Morphine and related  MEDICATIONS:  Current Outpatient Prescriptions  Medication Sig Dispense Refill  . acetaminophen (TYLENOL) 500 MG tablet Take 1 tablet (500 mg total) by mouth every 6 (six) hours as needed for mild pain. 30 tablet 0  . aspirin 325 MG tablet Take 325 mg by mouth daily.    [name] lisinopril (PRINIVIL,ZESTRIL) 5 MG tablet Take 5 mg by mouth daily.    . traMADol (ULTRAM) 50 MG tablet Take 50 mg by mouth every 12 (twelve) hours as needed for moderate pain.     No  current facility-administered medications for this encounter.    REVIEW OF SYSTEMS:  A 15 point review of systems is documented in the electronic medical record. This was obtained by the nursing staff. However, I reviewed this with the patient to discuss relevant findings and make appropriate changes.  Pertinent items are noted in HPI.   PHYSICAL EXAM: Per Med-Onc:RAL:alert,  healthy, no distress, well nourished and well developed SKIN: skin color, texture, turgor are normal, no rashes or significant lesions HEAD: Normocephalic, No masses, lesions, tenderness or abnormalities EYES: normal, PERRLA, Conjunctiva are pink and non-injected EARS: External ears normal, Canals clear OROPHARYNX:no exudate, no erythema and lips, buccal mucosa, and tongue normal  NECK: supple, no adenopathy, no JVD LYMPH:  no palpable lymphadenopathy, no hepatosplenomegaly LUNGS: clear to auscultation , and palpation HEART: regular rate & rhythm and no murmurs ABDOMEN:abdomen soft, non-tender, normal bowel sounds and no masses or organomegaly BACK: Back symmetric, no curvature., No CVA tenderness EXTREMITIES:no joint deformities, effusion, or inflammation, no edema, no skin discoloration  NEURO: alert & oriented x 3 with fluent speech, no focal motor/sensory deficits     KPS = 90  100 - Normal; no complaints; no evidence of disease. 90   - Able to carry on normal activity; minor signs or symptoms of disease. 80   - Normal activity with effort; some signs or symptoms of disease. 92   - Cares for self; unable to carry on normal activity or to do active work. 60   - Requires occasional assistance, but is able to care for most of his personal needs. 50   - Requires considerable assistance and frequent medical care. 68   - Disabled; requires special care and assistance. 3   - Severely disabled; hospital admission is indicated although death not imminent. 15   - Very sick; hospital admission necessary; active supportive treatment necessary. 10   - Moribund; fatal processes progressing rapidly. 0     - Dead  Karnofsky DA, Abelmann Monon, Craver LS and Guys JH [phone]) The use of the nitrogen mustards in the palliative treatment of carcinoma: with particular reference to bronchogenic carcinoma Cancer 1 [id number]  LABORATORY DATA:  Lab Results  Component Value Date   WBC 8.1 11/19/2014    HGB 13.5 11/19/2014   HCT 41.4 11/19/2014   MCV 91.8 11/19/2014   PLT 231 11/19/2014   Lab Results  Component Value Date   NA 134* 10/25/2014   K 4.9 10/25/2014   CL 101 10/25/2014   CO2 27 10/25/2014   Lab Results  Component Value Date   ALT 32 10/23/2014   AST 24 10/23/2014   ALKPHOS 80 10/23/2014   BILITOT 0.5 10/23/2014    PULMONARY FUNCTION TEST:  Not applicable   RADIOGRAPHY: Ct Abdomen Pelvis Wo Contrast  10/24/2014   CLINICAL DATA:  81 year old male with pathologic right femoral neck fracture. Evaluate for primary malignancy.  EXAM: CT CHEST, ABDOMEN AND PELVIS WITHOUT CONTRAST  TECHNIQUE: Multidetector CT imaging of the chest, abdomen and pelvis was performed following the standard protocol without IV contrast.  COMPARISON:  07/26/2010 abdominal and pelvic CT.  FINDINGS: CT CHEST FINDINGS  Mediastinum/Nodes: Coronary calcifications noted. Coronary and thoracic aortic calcifications noted. The heart and great vessels are otherwise unremarkable. No enlarged lymph nodes are identified. No pleural or pericardial effusions are present.  Lungs/Pleura:  Pulmonary nodules are identified as follows:  A 1.3 cm irregular nodule neck anterior left upper lobe (image 21).  A 1.3 cm irregular nodule within the left  upper lobe (image 22)  A 3 mm ground-glass nodule in the left upper lobe (image 29  Several 2 mm subpleural nodules along the lateral new right upper lobe and along the lateral minor fissure) images 22-26)  Mild paraseptal emphysema is noted.  There is no evidence of airspace disease, consolidation or endobronchial/endotracheal lesion.  Musculoskeletal: No acute or suspicious bony abnormalities are identified.  CT ABDOMEN AND PELVIS FINDINGS  Please note that parenchymal abnormalities may be missed without intravenous contrast.  Hepatobiliary: The liver and gallbladder are unremarkable. There is no evidence of biliary dilatation.  Pancreas: Unremarkable  Spleen: Unremarkable   Adrenals/Urinary Tract: Bilateral renal atrophy again noted. There is no evidence of hydronephrosis or urinary calculi. The adrenal glands are unremarkable. Circumferential bladder wall thickening is again noted.  Stomach/Bowel: A small hiatal hernia is again identified. There is no evidence of bowel obstruction or focal wall thickening.  Vascular/Lymphatic: Prominent periportal nodes are unchanged from 2011. No suspicious enlarged lymph nodes or abdominal aortic aneurysm noted.  Reproductive: Prostate enlargement noted.  Other: No free fluid, pneumoperitoneum or focal collection identified.  Musculoskeletal: There is mild horizontal sclerosis within the right femoral neck medially. No definite fracture line is noted. The MR findings in the proximal right femur are significantly more impressive than the CT findings, and the right femoral neck findings compatible with a stress fracture rather than a pathologic fracture.  Compression fractures of T12 and L1 are again noted.  IMPRESSION: Mild sclerosis within the medial right femoral neck compatible with stress fracture (rather than pathologic fracture) when correlated with the MR.  Two separate 1.3 cm irregular nodules within the left upper lobe-suspicious for pulmonary malignancy. PET-CT is recommended for further evaluation.  Bilateral renal atrophy.  Mild emphysema and prostate enlargement.   Electronically Signed   By: Margarette Canada M.D.   On: 10/24/2014 19:43   Dg Chest 1 View  11/19/2014   CLINICAL DATA:  Post biopsy  EXAM: CHEST  1 VIEW  COMPARISON:  10/24/2014  FINDINGS: No pneumothorax. Low volumes. Bibasilar atelectasis. Normal heart size.  IMPRESSION: No pneumothorax.   Electronically Signed   By: Marybelle Killings M.D.   On: 11/19/2014 12:30   Ct Chest Wo Contrast  10/24/2014   CLINICAL DATA:  81 year old male with pathologic right femoral neck fracture. Evaluate for primary malignancy.  EXAM: CT CHEST, ABDOMEN AND PELVIS WITHOUT CONTRAST  TECHNIQUE:  Multidetector CT imaging of the chest, abdomen and pelvis was performed following the standard protocol without IV contrast.  COMPARISON:  07/26/2010 abdominal and pelvic CT.  FINDINGS: CT CHEST FINDINGS  Mediastinum/Nodes: Coronary calcifications noted. Coronary and thoracic aortic calcifications noted. The heart and great vessels are otherwise unremarkable. No enlarged lymph nodes are identified. No pleural or pericardial effusions are present.  Lungs/Pleura:  Pulmonary nodules are identified as follows:  A 1.3 cm irregular nodule neck anterior left upper lobe (image 21).  A 1.3 cm irregular nodule within the left upper lobe (image 22)  A 3 mm ground-glass nodule in the left upper lobe (image 29  Several 2 mm subpleural nodules along the lateral new right upper lobe and along the lateral minor fissure) images 22-26)  Mild paraseptal emphysema is noted.  There is no evidence of airspace disease, consolidation or endobronchial/endotracheal lesion.  Musculoskeletal: No acute or suspicious bony abnormalities are identified.  CT ABDOMEN AND PELVIS FINDINGS  Please note that parenchymal abnormalities may be missed without intravenous contrast.  Hepatobiliary: The liver and gallbladder are unremarkable. There  is no evidence of biliary dilatation.  Pancreas: Unremarkable  Spleen: Unremarkable  Adrenals/Urinary Tract: Bilateral renal atrophy again noted. There is no evidence of hydronephrosis or urinary calculi. The adrenal glands are unremarkable. Circumferential bladder wall thickening is again noted.  Stomach/Bowel: A small hiatal hernia is again identified. There is no evidence of bowel obstruction or focal wall thickening.  Vascular/Lymphatic: Prominent periportal nodes are unchanged from 2011. No suspicious enlarged lymph nodes or abdominal aortic aneurysm noted.  Reproductive: Prostate enlargement noted.  Other: No free fluid, pneumoperitoneum or focal collection identified.  Musculoskeletal: There is mild  horizontal sclerosis within the right femoral neck medially. No definite fracture line is noted. The MR findings in the proximal right femur are significantly more impressive than the CT findings, and the right femoral neck findings compatible with a stress fracture rather than a pathologic fracture.  Compression fractures of T12 and L1 are again noted.  IMPRESSION: Mild sclerosis within the medial right femoral neck compatible with stress fracture (rather than pathologic fracture) when correlated with the MR.  Two separate 1.3 cm irregular nodules within the left upper lobe-suspicious for pulmonary malignancy. PET-CT is recommended for further evaluation.  Bilateral renal atrophy.  Mild emphysema and prostate enlargement.   Electronically Signed   By: Margarette Canada M.D.   On: 10/24/2014 19:43   Mr Jeri Cos EN Contrast  11/17/2014   CLINICAL DATA:  Staging. Non-small-cell lung cancer. No neurologic symptoms are reported. Initial encounter.  EXAM: MRI HEAD WITHOUT AND WITH CONTRAST  TECHNIQUE: Multiplanar, multiecho pulse sequences of the brain and surrounding structures were obtained without and with intravenous contrast.  CONTRAST:  70mL MULTIHANCE GADOBENATE DIMEGLUMINE 529 MG/ML IV SOLN  COMPARISON:  None.  FINDINGS: No evidence for acute infarction, hemorrhage, mass lesion, hydrocephalus, or extra-axial fluid. Generalized cerebral and cerebellar atrophy. Moderate T2 and FLAIR hyperintensities throughout the periventricular and subcortical white matter representing small vessel disease. Flow voids are maintained throughout the carotid, basilar, and vertebral arteries. There are no areas of chronic hemorrhage. Pituitary, pineal, and cerebellar tonsils unremarkable. No upper cervical lesions. Mild pannus surrounds the odontoid.  Post infusion, no abnormal enhancement of the brain or meninges. No intracranial metastatic disease is evident. Visualized calvarium, skull base, and upper cervical osseous structures  unremarkable. Scalp and extracranial soft tissues, orbits, sinuses, and mastoids show no acute process.  IMPRESSION: Chronic changes as described. No intracranial metastatic disease is evident.   Electronically Signed   By: Rolla Flatten M.D.   On: 11/17/2014 16:34   Mr Pelvis Wo Contrast  10/22/2014   CLINICAL DATA:  Anterior right hip pain, worse with weight-bearing.  EXAM: MR OF THE PELVIS AND RIGHT HIP WITHOUT CONTRAST  TECHNIQUE: Multiplanar, multisequence MR imaging was performed. No intravenous contrast was administered.  COMPARISON:  None.  FINDINGS: Bones: There is intense marrow edema in the right femoral neck extending into the proximal femoral diaphysis. Edema is most intense along the inferior 1/2 of the femoral neck where there is associated associated decreased T1 signal. Findings are consistent with fracture. The fracture may be pathologic as there appears to be cortical disruption along the posterior margin at the head - neck junction of the femur on axial images 12 through 14 of series 5. Bone marrow signal is otherwise unremarkable.  Articular cartilage and labrum  Articular cartilage: Mild bilateral hip degenerative disease is seen.  Labrum: The superior labrum of the right hip is diffusely degenerated and torn.  Joint or bursal effusion  Joint effusion:  Small right hip joint effusion is seen.  Bursae:  Unremarkable.  Muscles and tendons  Muscles and tendons:  Intact.  Other findings  Miscellaneous: Imaged intrapelvic contents demonstrate mild enlargement of the prostate gland.  IMPRESSION: Right femoral neck fracture with findings worrisome for pathologic fracture with cortical disruption of the posterior head neck margin identified as described above.  Mild to moderate right hip osteoarthritis with associated degenerative tearing of the labrum.  Findings called to Biagio Borg, P.A., at the time of interpretation.   Electronically Signed   By: Inge Rise M.D.   On: 10/22/2014 12:29     Mr Hip Right Wo Contrast  10/22/2014   CLINICAL DATA:  Anterior right hip pain, worse with weight-bearing.  EXAM: MR OF THE PELVIS AND RIGHT HIP WITHOUT CONTRAST  TECHNIQUE: Multiplanar, multisequence MR imaging was performed. No intravenous contrast was administered.  COMPARISON:  None.  FINDINGS: Bones: There is intense marrow edema in the right femoral neck extending into the proximal femoral diaphysis. Edema is most intense along the inferior 1/2 of the femoral neck where there is associated associated decreased T1 signal. Findings are consistent with fracture. The fracture may be pathologic as there appears to be cortical disruption along the posterior margin at the head - neck junction of the femur on axial images 12 through 14 of series 5. Bone marrow signal is otherwise unremarkable.  Articular cartilage and labrum  Articular cartilage: Mild bilateral hip degenerative disease is seen.  Labrum: The superior labrum of the right hip is diffusely degenerated and torn.  Joint or bursal effusion  Joint effusion:  Small right hip joint effusion is seen.  Bursae:  Unremarkable.  Muscles and tendons  Muscles and tendons:  Intact.  Other findings  Miscellaneous: Imaged intrapelvic contents demonstrate mild enlargement of the prostate gland.  IMPRESSION: Right femoral neck fracture with findings worrisome for pathologic fracture with cortical disruption of the posterior head neck margin identified as described above.  Mild to moderate right hip osteoarthritis with associated degenerative tearing of the labrum.  Findings called to Biagio Borg, P.A., at the time of interpretation.   Electronically Signed   By: Inge Rise M.D.   On: 10/22/2014 12:29   Nm Pet Image Initial (pi) Skull Base To Thigh  11/06/2014   CLINICAL DATA:  Initial treatment strategy for left upper lobe lung nodules.  EXAM: NUCLEAR MEDICINE PET SKULL BASE TO THIGH  TECHNIQUE: 9.8 mCi F-18 FDG was injected intravenously. Full-ring PET  imaging was performed from the skull base to thigh after the radiotracer. CT data was obtained and used for attenuation correction and anatomic localization.  FASTING BLOOD GLUCOSE:  Value: 108 mg/dl  COMPARISON:  Chest CT 10/24/2014.  FINDINGS: NECK  No hypermetabolic lymph nodes in the neck.  CHEST  Two left upper lobe pulmonary nodules are again noted. One of these is anterior and in contact with the pleura measuring 11 x 14 mm (image 36 of series 6), and is hypermetabolic (SUVmax = 4.4). The other is medially located in the left upper lobe (image 37 of series 6) measuring 11 x 14 mm, and is also hypermetabolic (SUVmax = 8.6). Another 5 mm left upper lobe nodule is noted (image 40 of series 6), unchanged in retrospect compared to the prior examination, and not hypermetabolic (likely below the resolution of PET imaging). Several other smaller pulmonary nodules are also noted measuring 2-4 mm in size scattered throughout the lungs bilaterally. Although there are no enlarged mediastinal or hilar  lymph nodes, there are borderline enlarged AP window and low right paratracheal lymph nodes, both of which measured 8 mm in short axis, which demonstrate low-level metabolic activity (SUVmax = 3.3 and 2.7 respectively), concerning for potential nodal involvement. Heart size is normal. There is no significant pericardial fluid, thickening or pericardial calcification. There is atherosclerosis of the thoracic aorta, the great vessels of the mediastinum and the coronary arteries, including calcified atherosclerotic plaque in the left main and left anterior descending coronary arteries. Mild paraseptal emphysema.  ABDOMEN/PELVIS  No abnormal hypermetabolic activity within the liver, pancreas, adrenal glands, or spleen. No hypermetabolic lymph nodes in the abdomen or pelvis. No significant volume of ascites. No pathologic distention of small bowel or colon. Extensive atherosclerosis throughout the abdominal aorta and major pelvic  vasculature, without definite aneurysm. Small bilateral hydroceles noted scrotum.  SKELETON  Hypermetabolism in the right femoral neck, compatible with a healing nondisplaced fracture. No definite aggressive osseous lesion in this region to suggest presence of a pathologic fracture. There is also a small focus of hypermetabolism in the intercostal musculature between the lateral left third and fourth ribs, with some adjacent similar low-level hypermetabolism in other left-sided intercostal muscles, favored to be physiologic related to recent muscular strain. No discrete soft tissue or osseous lesion is noted in this region to suggest the presence of a metastatic focus. No focal hypermetabolic activity to suggest skeletal metastasis.  IMPRESSION: 1. Multiple pulmonary nodules in the lungs bilaterally, the majority of which are nonspecific. There are 2 larger pulmonary nodules in the left upper lobe which are hypermetabolic, as discussed above. These are concerning for primary bronchogenic neoplasm, potentially 2 synchronous lesions. In addition, there are nonenlarged but hypermetabolic AP window and right paratracheal lymph nodes, which are nonspecific but concerning for potential nodal involvement. Multiple other smaller pulmonary nodules in the lungs bilaterally are highly nonspecific and warrant attention on followup studies. Correlation with biopsy is recommended at this time to establish a tissue diagnosis. 2. Healing nondisplaced right femoral neck fracture, without features to suggest a pathologic fracture. 3. Additional incidental findings, as above.   Electronically Signed   By: Vinnie Langton M.D.   On: 11/06/2014 09:37   Ct Biopsy  11/19/2014   CLINICAL DATA:  Left upper lobe lung nodules  EXAM: CT-GUIDED BIOPSY OF A LEFT UPPER LOBE LUNG NODULE.  CORE.  MEDICATIONS AND MEDICAL HISTORY: Versed 1 mg, Fentanyl 50 mcg.  Additional Medications: None.  ANESTHESIA/SEDATION: Moderate sedation time: 15 minutes   PROCEDURE: The procedure, risks, benefits, and alternatives were explained to the patient. Questions regarding the procedure were encouraged and answered. The patient understands and consents to the procedure.  The left upper anterior thorax was prepped with Betadine in a sterile fashion, and a sterile drape was applied covering the operative field. A sterile gown and sterile gloves were used for the procedure.  Under CT guidance, a(n) 17 gauge guide needle was advanced into the left upper lobe lung nodule. Subsequently 4 18 gauge core biopsies were obtained. The guide needle was removed. Final imaging was performed.  Patient tolerated the procedure well without complication. Vital sign monitoring by nursing staff during the procedure will continue as patient is in the special procedures unit for post procedure observation.  FINDINGS: The images document guide needle placement within the left upper lobe lung nodule. Post biopsy images demonstrate no pneumothorax.  COMPLICATIONS: None  IMPRESSION: Successful CT-guided core biopsy of a left upper lobe lung nodule.   Electronically Signed  By: Marybelle Killings M.D.   On: 11/19/2014 10:32   Dg Chest Port 1 View  10/24/2014   CLINICAL DATA:  Essential hypertension  EXAM: PORTABLE CHEST - 1 VIEW  COMPARISON:  None.  FINDINGS: The heart size and mediastinal contours are within normal limits. Both lungs are clear. The visualized skeletal structures are unremarkable. Mild apical scarring bilaterally.  IMPRESSION: No active disease.   Electronically Signed   By: Franchot Gallo M.D.   On: 10/24/2014 09:09      IMPRESSION: 81 y/o man with multi-focal left upper lung cancer. I believe this is amenable to radiation and chemotherapy  PLAN: I recommend a combination of radiation 5 days/week for 6.5 weeks and chemotherapy. Schedule planning appointment  I spent 40 minutes minutes face to face with the patient and more than 50% of that time was spent in counseling and/or  coordination of care.  This document serves as a record of services personally performed by Tyler Pita, MD. It was created on his behalf by Pearlie Oyster, a trained medical scribe. The creation of this record is based on the scribe's personal observations and the provider's statements to them. This document has been checked and approved by the attending provider.        ------------------------------------------------  Sheral Apley Tammi Klippel, M.D.

## 2014-11-20 NOTE — Progress Notes (Signed)
[place] Clinical Social Work  Clinical Social Work met with patient/family and Futures trader at [facility] appointment to offer support and assess for psychosocial needs.  Medical oncologist reviewed patient's diagnosis and recommended treatment plan with patient/family.  [name] was accompanied by his wife, Travis Medina.  They have two children whom are very supportive and he works 8 hours/week delivering prescriptions through [employer].  The patient scored a 5 on the Psychosocial Distress Thermometer which indicates moderate distress. [name] shared he feels less anxious after meeting with Dr. Julien Medina today.  We discussed the fear of uncertainty and CSW encouraged patient/spouse to contact CSW if they continue to feel anxious after they begin treatment.  ONCBCN DISTRESS SCREENING 11/20/2014  Screening Type Initial Screening  Distress experienced in past week (1-10) 5  Emotional problem type Nervousness/Anxiety;Adjusting to illness  Spiritual/Religous concerns type Relating to God   Clinical Social Work briefly discussed Clinical Social Work role and [employer] support programs/services.  Clinical Social Work encouraged patient to call with any additional questions or concerns.   Polo Riley, MSW, LCSW, OSW-C Clinical Social Worker [facility] [phone]

## 2014-11-21 ENCOUNTER — Other Ambulatory Visit: Payer: Self-pay | Admitting: *Deleted

## 2014-11-21 ENCOUNTER — Ambulatory Visit
Admission: RE | Admit: 2014-11-21 | Discharge: 2014-11-21 | Disposition: A | Discharge status: Home / Self Care | Payer: Medicare Other | Source: Ambulatory Visit | Attending: Radiation Oncology | Admitting: Radiation Oncology

## 2014-11-21 ENCOUNTER — Telehealth: Payer: Self-pay | Admitting: *Deleted

## 2014-11-21 ENCOUNTER — Encounter: Payer: Self-pay | Admitting: *Deleted

## 2014-11-21 ENCOUNTER — Telehealth: Payer: Self-pay | Admitting: Internal Medicine

## 2014-11-21 ENCOUNTER — Encounter: Payer: Medicare Other | Admitting: Surgery

## 2014-11-21 VITALS — BP 117/72 | HR 88 | Temp 98.0°F | Resp 16 | Ht 70.0 in | Wt 198.0 lb

## 2014-11-21 DIAGNOSIS — K209 Esophagitis, unspecified: Secondary | ICD-10-CM | POA: Insufficient documentation

## 2014-11-21 DIAGNOSIS — C3412 Malignant neoplasm of upper lobe, left bronchus or lung: Secondary | ICD-10-CM | POA: Insufficient documentation

## 2014-11-21 DIAGNOSIS — R918 Other nonspecific abnormal finding of lung field: Secondary | ICD-10-CM

## 2014-11-21 DIAGNOSIS — L599 Disorder of the skin and subcutaneous tissue related to radiation, unspecified: Secondary | ICD-10-CM | POA: Insufficient documentation

## 2014-11-21 DIAGNOSIS — Z510 Encounter for antineoplastic radiation therapy: Secondary | ICD-10-CM | POA: Insufficient documentation

## 2014-11-21 DIAGNOSIS — Z87891 Personal history of nicotine dependence: Secondary | ICD-10-CM | POA: Diagnosis not present

## 2014-11-21 HISTORY — DX: Malignant neoplasm of unspecified part of unspecified bronchus or lung: C34.90

## 2014-11-21 NOTE — Telephone Encounter (Signed)
Called to schedule patient to see Dr. [name] on 11/27/14 arrive at 1:00.  I left him a vm message.

## 2014-11-21 NOTE — Progress Notes (Signed)
  Radiation Oncology         [phone] ________________________________  Name: Travis Medina MRN: [id number]  Date: 11/21/2014  DOB: 1933/08/18  SIMULATION AND TREATMENT PLANNING NOTE    ICD-9-CM ICD-10-CM   1. 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  NARRATIVE:  The patient was brought to the [place].  Identity was confirmed.  All relevant records and images related to the planned course of therapy were reviewed.  The patient freely provided informed written consent to proceed with treatment after reviewing the details related to the planned course of therapy. The consent form was witnessed and verified by the simulation staff.  Then, the patient was set-up in a stable reproducible  supine position for radiation therapy.  CT images were obtained.  Surface markings were placed.  The CT images were loaded into the planning software.  Then the target and avoidance structures were contoured.  Treatment planning then occurred.  The radiation prescription was entered and confirmed.  Then, I designed and supervised the construction of a total of 5 medically necessary complex treatment devices, in the form of [place] to shape radiation around his target to shield critical structures.  I have requested : 3D Simulation  I have requested a DVH of the following structures: Spinal cord, left lung, right lung, heart, esphogus, target.  I have ordered:Nutrition Consult  SPECIAL TREATMENT PROCEDURE:  The planned course of therapy using radiation constitutes a special treatment procedure. Special care is required in the management of this patient for the following reasons. This treatment constitutes a Special Treatment Procedure for the following reason: [ Concurrent chemotherapy requiring careful monitoring for increased toxicities of treatment including weekly laboratory values..  The  special nature of the planned course of radiotherapy will require increased physician supervision and oversight to ensure patient's safety with optimal treatment outcomes.  PLAN:  The patient will receive 59.4 Gy in 33 fractions.  This document serves as a record of services personally performed by Tyler Pita, MD. It was created on his behalf by Derek Mound, a trained medical scribe. The creation of this record is based on the scribe's personal observations and the provider's statements to them. This document has been checked and approved by the attending provider.  ________________________________  Sheral Apley. Tammi Klippel, M.D.

## 2014-11-21 NOTE — Progress Notes (Signed)
Patient denies pain. Patient riding in wheelchair. Pleasant affect noted. Vitals stable. Denies history of radiation therapy. Denies having a pacemaker. Denies allergy to contrast.

## 2014-11-21 NOTE — [facility] Oncology Navigator Note (Unsigned)
   Thoracic Treatment Summary Name:Travis Medina Date:11/21/2014 DOB:[birth year] Your Medical Team Medical Oncologist:Dr. [name] Radiation Oncologist:Dr. Tammi Klippel  Type and Stage of Lung Cancer  Small Cell Carcinoma:Limited Stage     Clinical stage is based on radiology exams.  Pathological stage will be determined after surgery.  Staging is based on the size of the tumor, involvement of lymph nodes or not, and whether or not the cancer center has spread. Recommendations Recommendations: Chemo and radiation therapy  These recommendations are based on information available as of today's consult.  This is subject to change depending further testing or exams. Next Steps Next Step: Radiation Oncology will set up follow up appointments  11/21/14 [facility] Medical Oncology will set up follow up appointments 11/27/14 Appt with Dr. [name]  Barriers to Care What do you perceive as a potential barrier that may prevent you from receiving your treatment plan? Education information given and explained Financial FA notified of assistance needed   Resources Given: [place] on Surveyor, minerals at [employer].Radonna Ricker [phone] Fall risk information given   Questions Norton Blizzard, RN BSN Thoracic Oncology Nurse Navigator at [place] is a nurse navigator that is available to assist you through your cancer journey.  She can answer your questions and/or provide resources regarding your treatment plan, emotional support, or financial concerns.

## 2014-11-21 NOTE — Telephone Encounter (Signed)
Added appt per staff...per staff pt notified

## 2014-11-26 ENCOUNTER — Telehealth: Payer: Self-pay | Admitting: *Deleted

## 2014-11-26 NOTE — Telephone Encounter (Signed)
Called and confirmed 11/27/14 clinic appt w/ pt.

## 2014-11-27 ENCOUNTER — Other Ambulatory Visit (HOSPITAL_BASED_OUTPATIENT_CLINIC_OR_DEPARTMENT_OTHER): Payer: Medicare Other

## 2014-11-27 ENCOUNTER — Ambulatory Visit (HOSPITAL_BASED_OUTPATIENT_CLINIC_OR_DEPARTMENT_OTHER): Payer: Medicare Other | Admitting: Internal Medicine

## 2014-11-27 ENCOUNTER — Encounter: Payer: Self-pay | Admitting: Internal Medicine

## 2014-11-27 VITALS — BP 164/83 | HR 86 | Temp 98.3°F | Resp 18 | Ht 70.0 in | Wt 201.7 lb

## 2014-11-27 DIAGNOSIS — C3492 Malignant neoplasm of unspecified part of left bronchus or lung: Secondary | ICD-10-CM

## 2014-11-27 DIAGNOSIS — C3412 Malignant neoplasm of upper lobe, left bronchus or lung: Secondary | ICD-10-CM | POA: Diagnosis not present

## 2014-11-27 DIAGNOSIS — Z510 Encounter for antineoplastic radiation therapy: Secondary | ICD-10-CM | POA: Diagnosis not present

## 2014-11-27 LAB — CBC WITH DIFFERENTIAL/PLATELET
BASO%: 0.5 % (ref 0.0–2.0)
Basophils Absolute: 0 10*3/uL (ref 0.0–0.1)
EOS%: 3.2 % (ref 0.0–7.0)
Eosinophils Absolute: 0.3 10*3/uL (ref 0.0–0.5)
HEMATOCRIT: 39.2 % (ref 38.4–49.9)
HEMOGLOBIN: 13.1 g/dL (ref 13.0–17.1)
LYMPH%: 26 % (ref 14.0–49.0)
MCH: 30.5 pg (ref 27.2–33.4)
MCHC: 33.4 g/dL (ref 32.0–36.0)
MCV: 91.2 fL (ref 79.3–98.0)
MONO#: 0.4 10*3/uL (ref 0.1–0.9)
MONO%: 5.6 % (ref 0.0–14.0)
NEUT%: 64.7 % (ref 39.0–75.0)
NEUTROS ABS: 5.1 10*3/uL (ref 1.5–6.5)
Platelets: 252 10*3/uL (ref 140–400)
RBC: 4.3 10*6/uL (ref 4.20–5.82)
RDW: 12.7 % (ref 11.0–14.6)
WBC: 7.9 10*3/uL (ref 4.0–10.3)
lymph#: 2.1 10*3/uL (ref 0.9–3.3)

## 2014-11-27 LAB — COMPREHENSIVE METABOLIC PANEL (CC13)
ALT: 20 U/L (ref 0–55)
ANION GAP: 13 meq/L — AB (ref 3–11)
AST: 16 U/L (ref 5–34)
Albumin: 3.8 g/dL (ref 3.5–5.0)
Alkaline Phosphatase: 85 U/L (ref 40–150)
BILIRUBIN TOTAL: 0.21 mg/dL (ref 0.20–1.20)
BUN: 22.6 mg/dL (ref 7.0–26.0)
CALCIUM: 9.3 mg/dL (ref 8.4–10.4)
CHLORIDE: 106 meq/L (ref 98–109)
CO2: 21 meq/L — AB (ref 22–29)
CREATININE: 1.4 mg/dL — AB (ref 0.7–1.3)
EGFR: 50 mL/min/{1.73_m2} — ABNORMAL LOW (ref >90)
Glucose: 172 mg/dl — ABNORMAL HIGH (ref 70–140)
Potassium: 4.6 mEq/L (ref 3.5–5.1)
Sodium: 139 mEq/L (ref 136–145)
Total Protein: 7.2 g/dL (ref 6.4–8.3)

## 2014-11-27 MED ORDER — PROCHLORPERAZINE MALEATE 10 MG PO TABS: 10.0000 mg | ORAL_TABLET | Freq: Four times a day (QID) | ORAL | Status: DC | PRN

## 2014-11-27 NOTE — Progress Notes (Signed)
[place] [phone]   [phone] Multidisciplinary thoracic oncology clinic  OFFICE PROGRESS NOTE  [name], MD [place] [address]  DIAGNOSIS: Limited stage (T3, N2, M0) small cell lung cancer diagnosed in February 2016 presenting with 2 pulmonary nodules in the left upper lobe as well as questionable mediastinal lymphadenopathy.  PRIOR THERAPY: None  CURRENT THERAPY: Systemic chemotherapy with carboplatin for AUC of 5 on day 1 and etoposide 120 MG/M2 on days 1, 2 and 3 with Neulasta support on day 4. First dose on 12/02/2014. This will be concurrent with radiotherapy under the care of Dr. Tammi Klippel starting next week.  INTERVAL HISTORY: Travis Medina 81 y.o. male returns to the clinic today for follow-up visit accompanied by his wife. The patient is feeling fine today with no specific complaints except for the pain in the right hip area. He is undergoing medical management for his questionable hip fracture. He denied having any significant chest pain, shortness of breath, cough or hemoptysis. The patient denied having any significant nausea or vomiting, no fever or chills. He has no significant weight loss or night sweats. The final pathology of the CT-guided biopsy of the left upper lobe lung nodule was consistent with high-grade carcinoma, small cell type. The patient is here today for evaluation and discussion of his treatment options.  MEDICAL HISTORY: Past Medical History  Diagnosis Date  . Essential hypertension   . Lung cancer     ALLERGIES:  is allergic to morphine and related.  MEDICATIONS:  Current Outpatient Prescriptions  Medication Sig Dispense Refill  . acetaminophen (TYLENOL) 500 MG tablet Take 1 tablet (500 mg total) by mouth every 6 (six) hours as needed for mild pain. 30 tablet 0  . aspirin 325 MG tablet Take 325 mg by mouth daily.    [name] lisinopril (PRINIVIL,ZESTRIL) 5 MG tablet Take 5 mg by mouth  daily.     No current facility-administered medications for this visit.    SURGICAL HISTORY:  Past Surgical History  Procedure Laterality Date  . Total knee arthroplasty      bilateral  . Lung biopsy      REVIEW OF SYSTEMS:  Constitutional: negative Eyes: negative Ears, nose, mouth, throat, and face: negative Respiratory: negative Cardiovascular: negative Gastrointestinal: negative Genitourinary:negative Integument/breast: negative Hematologic/lymphatic: negative Musculoskeletal:positive for bone pain Neurological: negative Behavioral/Psych: negative Endocrine: negative Allergic/Immunologic: negative   PHYSICAL EXAMINATION: General appearance: alert, cooperative and no distress Head: Normocephalic, without obvious abnormality, atraumatic Neck: no adenopathy, no JVD, supple, symmetrical, trachea midline and thyroid not enlarged, symmetric, no tenderness/mass/nodules Lymph nodes: Cervical, supraclavicular, and axillary nodes normal. Resp: clear to auscultation bilaterally Back: symmetric, no curvature. ROM normal. No CVA tenderness. Cardio: regular rate and rhythm, S1, S2 normal, no murmur, click, rub or gallop GI: soft, non-tender; bowel sounds normal; no masses,  no organomegaly Extremities: extremities normal, atraumatic, no cyanosis or edema Neurologic: Alert and oriented X 3, normal strength and tone. Normal symmetric reflexes. Normal coordination and gait  ECOG PERFORMANCE STATUS: 1 - Symptomatic but completely ambulatory  Blood pressure 164/83, pulse 86, temperature 98.3 F (36.8 C), temperature source Oral, resp. rate 18, height '5\' 10"'$  (1.778 m), weight 201 lb 11.2 oz (91.491 kg), SpO2 100 %.  LABORATORY DATA: Lab Results  Component Value Date   WBC 7.9 11/27/2014   HGB 13.1 11/27/2014   HCT 39.2 11/27/2014   MCV 91.2 11/27/2014   PLT 252 11/27/2014      Chemistry  Component Value Date/Time   NA 139 11/27/2014 1257   NA 134* 10/25/2014 0551   K 4.6  11/27/2014 1257   K 4.9 10/25/2014 0551   CL 101 10/25/2014 0551   CO2 21* 11/27/2014 1257   CO2 27 10/25/2014 0551   BUN 22.6 11/27/2014 1257   BUN 27* 10/25/2014 0551   CREATININE 1.4* 11/27/2014 1257   CREATININE 1.41* 10/25/2014 0551      Component Value Date/Time   CALCIUM 9.3 11/27/2014 1257   CALCIUM 10.0 10/25/2014 0551   ALKPHOS 85 11/27/2014 1257   ALKPHOS 80 10/23/2014 2230   AST 16 11/27/2014 1257   AST 24 10/23/2014 2230   ALT 20 11/27/2014 1257   ALT 32 10/23/2014 2230   BILITOT 0.21 11/27/2014 1257   BILITOT 0.5 10/23/2014 2230       RADIOGRAPHIC STUDIES: Dg Chest 1 View  11/19/2014   CLINICAL DATA:  Post biopsy  EXAM: CHEST  1 VIEW  COMPARISON:  10/24/2014  FINDINGS: No pneumothorax. Low volumes. Bibasilar atelectasis. Normal heart size.  IMPRESSION: No pneumothorax.   Electronically Signed   By: Marybelle Killings M.D.   On: 11/19/2014 12:30   Mr Jeri Cos IR Contrast  11/17/2014   CLINICAL DATA:  Staging. Non-small-cell lung cancer. No neurologic symptoms are reported. Initial encounter.  EXAM: MRI HEAD WITHOUT AND WITH CONTRAST  TECHNIQUE: Multiplanar, multiecho pulse sequences of the brain and surrounding structures were obtained without and with intravenous contrast.  CONTRAST:  40m MULTIHANCE GADOBENATE DIMEGLUMINE 529 MG/ML IV SOLN  COMPARISON:  None.  FINDINGS: No evidence for acute infarction, hemorrhage, mass lesion, hydrocephalus, or extra-axial fluid. Generalized cerebral and cerebellar atrophy. Moderate T2 and FLAIR hyperintensities throughout the periventricular and subcortical white matter representing small vessel disease. Flow voids are maintained throughout the carotid, basilar, and vertebral arteries. There are no areas of chronic hemorrhage. Pituitary, pineal, and cerebellar tonsils unremarkable. No upper cervical lesions. Mild pannus surrounds the odontoid.  Post infusion, no abnormal enhancement of the brain or meninges. No intracranial metastatic disease  is evident. Visualized calvarium, skull base, and upper cervical osseous structures unremarkable. Scalp and extracranial soft tissues, orbits, sinuses, and mastoids show no acute process.  IMPRESSION: Chronic changes as described. No intracranial metastatic disease is evident.   Electronically Signed   By: JRolla FlattenM.D.   On: 11/17/2014 16:34   Nm Pet Image Initial (pi) Skull Base To Thigh  11/06/2014   CLINICAL DATA:  Initial treatment strategy for left upper lobe lung nodules.  EXAM: NUCLEAR MEDICINE PET SKULL BASE TO THIGH  TECHNIQUE: 9.8 mCi F-18 FDG was injected intravenously. Full-ring PET imaging was performed from the skull base to thigh after the radiotracer. CT data was obtained and used for attenuation correction and anatomic localization.  FASTING BLOOD GLUCOSE:  Value: 108 mg/dl  COMPARISON:  Chest CT 10/24/2014.  FINDINGS: NECK  No hypermetabolic lymph nodes in the neck.  CHEST  Two left upper lobe pulmonary nodules are again noted. One of these is anterior and in contact with the pleura measuring 11 x 14 mm (image 36 of series 6), and is hypermetabolic (SUVmax = 4.4). The other is medially located in the left upper lobe (image 37 of series 6) measuring 11 x 14 mm, and is also hypermetabolic (SUVmax = 8.6). Another 5 mm left upper lobe nodule is noted (image 40 of series 6), unchanged in retrospect compared to the prior examination, and not hypermetabolic (likely below the resolution of PET imaging). Several other  smaller pulmonary nodules are also noted measuring 2-4 mm in size scattered throughout the lungs bilaterally. Although there are no enlarged mediastinal or hilar lymph nodes, there are borderline enlarged AP window and low right paratracheal lymph nodes, both of which measured 8 mm in short axis, which demonstrate low-level metabolic activity (SUVmax = 3.3 and 2.7 respectively), concerning for potential nodal involvement. Heart size is normal. There is no significant pericardial fluid,  thickening or pericardial calcification. There is atherosclerosis of the thoracic aorta, the great vessels of the mediastinum and the coronary arteries, including calcified atherosclerotic plaque in the left main and left anterior descending coronary arteries. Mild paraseptal emphysema.  ABDOMEN/PELVIS  No abnormal hypermetabolic activity within the liver, pancreas, adrenal glands, or spleen. No hypermetabolic lymph nodes in the abdomen or pelvis. No significant volume of ascites. No pathologic distention of small bowel or colon. Extensive atherosclerosis throughout the abdominal aorta and major pelvic vasculature, without definite aneurysm. Small bilateral hydroceles noted scrotum.  SKELETON  Hypermetabolism in the right femoral neck, compatible with a healing nondisplaced fracture. No definite aggressive osseous lesion in this region to suggest presence of a pathologic fracture. There is also a small focus of hypermetabolism in the intercostal musculature between the lateral left third and fourth ribs, with some adjacent similar low-level hypermetabolism in other left-sided intercostal muscles, favored to be physiologic related to recent muscular strain. No discrete soft tissue or osseous lesion is noted in this region to suggest the presence of a metastatic focus. No focal hypermetabolic activity to suggest skeletal metastasis.  IMPRESSION: 1. Multiple pulmonary nodules in the lungs bilaterally, the majority of which are nonspecific. There are 2 larger pulmonary nodules in the left upper lobe which are hypermetabolic, as discussed above. These are concerning for primary bronchogenic neoplasm, potentially 2 synchronous lesions. In addition, there are nonenlarged but hypermetabolic AP window and right paratracheal lymph nodes, which are nonspecific but concerning for potential nodal involvement. Multiple other smaller pulmonary nodules in the lungs bilaterally are highly nonspecific and warrant attention on  followup studies. Correlation with biopsy is recommended at this time to establish a tissue diagnosis. 2. Healing nondisplaced right femoral neck fracture, without features to suggest a pathologic fracture. 3. Additional incidental findings, as above.   Electronically Signed   By: Vinnie Langton M.D.   On: 11/06/2014 09:37   Ct Biopsy  11/19/2014   CLINICAL DATA:  Left upper lobe lung nodules  EXAM: CT-GUIDED BIOPSY OF A LEFT UPPER LOBE LUNG NODULE.  CORE.  MEDICATIONS AND MEDICAL HISTORY: Versed 1 mg, Fentanyl 50 mcg.  Additional Medications: None.  ANESTHESIA/SEDATION: Moderate sedation time: 15 minutes  PROCEDURE: The procedure, risks, benefits, and alternatives were explained to the patient. Questions regarding the procedure were encouraged and answered. The patient understands and consents to the procedure.  The left upper anterior thorax was prepped with Betadine in a sterile fashion, and a sterile drape was applied covering the operative field. A sterile gown and sterile gloves were used for the procedure.  Under CT guidance, a(n) 17 gauge guide needle was advanced into the left upper lobe lung nodule. Subsequently 4 18 gauge core biopsies were obtained. The guide needle was removed. Final imaging was performed.  Patient tolerated the procedure well without complication. Vital sign monitoring by nursing staff during the procedure will continue as patient is in the special procedures unit for post procedure observation.  FINDINGS: The images document guide needle placement within the left upper lobe lung nodule. Post biopsy images demonstrate  no pneumothorax.  COMPLICATIONS: None  IMPRESSION: Successful CT-guided core biopsy of a left upper lobe lung nodule.   Electronically Signed   By: Marybelle Killings M.D.   On: 11/19/2014 10:32    ASSESSMENT AND PLAN: This is a very pleasant 81 years old white male recently diagnosed with limited stage small cell lung cancer presented with a 2 left upper lobe pulmonary  nodules as well as questionable mediastinal lymphadenopathy.  I had a lengthy discussion with the patient and his wife today about his current disease stage, prognosis and treatment options. I recommended for the patient a course of curative therapy with systemic chemotherapy in the form of carboplatin for AUC of 5 on day 1 and etoposide at 120 MG/M2 on days 1, 2 and 3 with Neulasta support on day 4. This will be concurrent with radiotherapy during the first 2 cycles of his chemotherapy. I discussed with the patient adverse effect of the chemotherapy including but not limited to alopecia, myelosuppression, nausea and vomiting, peripheral neuropathy, liver or renal dysfunction. The patient would like to proceed with treatment as planned. He is expected to start the first cycle of this treatment early next week. He will have a chemotherapy education class before starting the first dose of his chemotherapy. I will call his pharmacy with prescription for Compazine 10 mg by mouth every 6 hours as needed for nausea. The patient would come back for follow-up visit in 2 weeks for reevaluation and management of any adverse effect of his treatment. He was advised to call immediately if he has any concerning symptoms in the interval. The patient voices understanding of current disease status and treatment options and is in agreement with the current care plan.  All questions were answered. The patient knows to call the clinic with any problems, questions or concerns. We can certainly see the patient much sooner if necessary.  I spent 20 minutes counseling the patient face to face. The total time spent in the appointment was 30 minutes.  Disclaimer: This note was dictated with voice recognition software. Similar sounding words can inadvertently be transcribed and may not be corrected upon review.

## 2014-11-28 ENCOUNTER — Encounter: Payer: Medicare Other | Admitting: Surgery

## 2014-11-28 ENCOUNTER — Other Ambulatory Visit: Payer: Self-pay | Admitting: *Deleted

## 2014-11-28 ENCOUNTER — Other Ambulatory Visit: Payer: Medicare Other

## 2014-11-28 ENCOUNTER — Ambulatory Visit
Admission: RE | Admit: 2014-11-28 | Discharge: 2014-11-28 | Disposition: A | Discharge status: Home / Self Care | Payer: Medicare Other | Source: Ambulatory Visit | Attending: Radiation Oncology | Admitting: Radiation Oncology

## 2014-11-28 ENCOUNTER — Telehealth: Payer: Self-pay | Admitting: Internal Medicine

## 2014-11-28 DIAGNOSIS — Z510 Encounter for antineoplastic radiation therapy: Secondary | ICD-10-CM | POA: Diagnosis not present

## 2014-11-28 DIAGNOSIS — R918 Other nonspecific abnormal finding of lung field: Secondary | ICD-10-CM

## 2014-11-28 DIAGNOSIS — C3412 Malignant neoplasm of upper lobe, left bronchus or lung: Secondary | ICD-10-CM

## 2014-11-28 NOTE — Telephone Encounter (Signed)
Pt confirmed labs/ov per 04/21 POF, gave pt AVS and Calendar.... KJ, sent msg to add chemo

## 2014-12-01 ENCOUNTER — Ambulatory Visit
Admission: RE | Admit: 2014-12-01 | Discharge: 2014-12-01 | Disposition: A | Discharge status: Home / Self Care | Payer: Medicare Other | Source: Ambulatory Visit | Attending: Radiation Oncology | Admitting: Radiation Oncology

## 2014-12-01 DIAGNOSIS — Z510 Encounter for antineoplastic radiation therapy: Secondary | ICD-10-CM | POA: Diagnosis not present

## 2014-12-02 ENCOUNTER — Ambulatory Visit (HOSPITAL_BASED_OUTPATIENT_CLINIC_OR_DEPARTMENT_OTHER): Payer: Medicare Other

## 2014-12-02 ENCOUNTER — Encounter: Payer: Self-pay | Admitting: *Deleted

## 2014-12-02 ENCOUNTER — Other Ambulatory Visit (HOSPITAL_BASED_OUTPATIENT_CLINIC_OR_DEPARTMENT_OTHER): Payer: Medicare Other

## 2014-12-02 ENCOUNTER — Ambulatory Visit
Admission: RE | Admit: 2014-12-02 | Discharge: 2014-12-02 | Disposition: A | Discharge status: Home / Self Care | Payer: Medicare Other | Source: Ambulatory Visit | Attending: Radiation Oncology | Admitting: Radiation Oncology

## 2014-12-02 VITALS — BP 147/69 | HR 77 | Temp 98.6°F | Resp 18

## 2014-12-02 DIAGNOSIS — C3412 Malignant neoplasm of upper lobe, left bronchus or lung: Secondary | ICD-10-CM | POA: Diagnosis not present

## 2014-12-02 DIAGNOSIS — Z5111 Encounter for antineoplastic chemotherapy: Secondary | ICD-10-CM

## 2014-12-02 DIAGNOSIS — Z510 Encounter for antineoplastic radiation therapy: Secondary | ICD-10-CM | POA: Diagnosis not present

## 2014-12-02 DIAGNOSIS — C3492 Malignant neoplasm of unspecified part of left bronchus or lung: Secondary | ICD-10-CM

## 2014-12-02 LAB — COMPREHENSIVE METABOLIC PANEL (CC13)
ALK PHOS: 82 U/L (ref 40–150)
ALT: 19 U/L (ref 0–55)
AST: 18 U/L (ref 5–34)
Albumin: 3.8 g/dL (ref 3.5–5.0)
Anion Gap: 14 mEq/L — ABNORMAL HIGH (ref 3–11)
BUN: 27.8 mg/dL — AB (ref 7.0–26.0)
CALCIUM: 9.5 mg/dL (ref 8.4–10.4)
CO2: 19 meq/L — AB (ref 22–29)
Chloride: 105 mEq/L (ref 98–109)
Creatinine: 1.4 mg/dL — ABNORMAL HIGH (ref 0.7–1.3)
EGFR: 50 mL/min/{1.73_m2} — ABNORMAL LOW (ref >90)
GLUCOSE: 113 mg/dL (ref 70–140)
POTASSIUM: 4.7 meq/L (ref 3.5–5.1)
Sodium: 138 mEq/L (ref 136–145)
Total Bilirubin: 0.45 mg/dL (ref 0.20–1.20)
Total Protein: 7 g/dL (ref 6.4–8.3)

## 2014-12-02 LAB — CBC WITH DIFFERENTIAL/PLATELET
BASO%: 0.2 % (ref 0.0–2.0)
Basophils Absolute: 0 10*3/uL (ref 0.0–0.1)
EOS ABS: 0.1 10*3/uL (ref 0.0–0.5)
EOS%: 1.2 % (ref 0.0–7.0)
HEMATOCRIT: 38.4 % (ref 38.4–49.9)
HGB: 12.9 g/dL — ABNORMAL LOW (ref 13.0–17.1)
LYMPH#: 1.6 10*3/uL (ref 0.9–3.3)
LYMPH%: 17.4 % (ref 14.0–49.0)
MCH: 30.4 pg (ref 27.2–33.4)
MCHC: 33.6 g/dL (ref 32.0–36.0)
MCV: 90.6 fL (ref 79.3–98.0)
MONO#: 0.8 10*3/uL (ref 0.1–0.9)
MONO%: 8.8 % (ref 0.0–14.0)
NEUT%: 72.4 % (ref 39.0–75.0)
NEUTROS ABS: 6.8 10*3/uL — AB (ref 1.5–6.5)
Platelets: 224 10*3/uL (ref 140–400)
RBC: 4.24 10*6/uL (ref 4.20–5.82)
RDW: 12.7 % (ref 11.0–14.6)
WBC: 9.4 10*3/uL (ref 4.0–10.3)

## 2014-12-02 MED ORDER — SODIUM CHLORIDE 0.9 % IV SOLN
Freq: Once | INTRAVENOUS | Status: AC
  Administered 2014-12-02: 11:00:00 via INTRAVENOUS
  Filled 2014-12-02: qty 8

## 2014-12-02 MED ORDER — SODIUM CHLORIDE 0.9 % IV SOLN
120.0000 mg/m2 | Freq: Once | INTRAVENOUS | Status: AC
  Administered 2014-12-02: 260 mg via INTRAVENOUS
  Filled 2014-12-02: qty 13

## 2014-12-02 MED ORDER — SODIUM CHLORIDE 0.9 % IV SOLN
Freq: Once | INTRAVENOUS | Status: AC
  Administered 2014-12-02: 11:00:00 via INTRAVENOUS

## 2014-12-02 MED ORDER — SODIUM CHLORIDE 0.9 % IV SOLN
406.5000 mg | Freq: Once | INTRAVENOUS | Status: AC
  Administered 2014-12-02: 410 mg via INTRAVENOUS
  Filled 2014-12-02: qty 41

## 2014-12-02 NOTE — [facility] Oncology Navigator Note (Unsigned)
Spoke with Travis Medina today at his first chemotherapy tx.  He is doing well.  He states he is having less pain due to XRT for his hip.  No needs identified at this time.

## 2014-12-02 NOTE — Patient Instructions (Addendum)
[facility] Discharge Instructions for Patients Receiving Chemotherapy  Today you received the following chemotherapy agents; Carboplatin and Etoposide.   To help prevent nausea and vomiting after your treatment, we encourage you to take your nausea medication, Prochlorperazine (Compazine) as directed.    If you develop nausea and vomiting that is not controlled by your nausea medication, call the clinic.   BELOW ARE SYMPTOMS THAT SHOULD BE REPORTED IMMEDIATELY:  *FEVER GREATER THAN 100.5 F  *CHILLS WITH OR WITHOUT FEVER  NAUSEA AND VOMITING THAT IS NOT CONTROLLED WITH YOUR NAUSEA MEDICATION  *UNUSUAL SHORTNESS OF BREATH  *UNUSUAL BRUISING OR BLEEDING  TENDERNESS IN MOUTH AND THROAT WITH OR WITHOUT PRESENCE OF ULCERS  *URINARY PROBLEMS  *BOWEL PROBLEMS  UNUSUAL RASH Items with * indicate a potential emergency and should be followed up as soon as possible.  Feel free to call the clinic you have any questions or concerns. The clinic phone number is [phone].  Please show the [employer] at check-in to the Emergency Department and triage nurse.  Carboplatin injection What is this medicine? CARBOPLATIN (KAR boe pla tin) is a chemotherapy drug. It targets fast dividing cells, like cancer cells, and causes these cells to die. This medicine is used to treat ovarian cancer and many other cancers. This medicine may be used for other purposes; ask your health care provider or pharmacist if you have questions. COMMON BRAND NAME(S): Paraplatin What should I tell my health care provider before I take this medicine? They need to know if you have any of these conditions: -blood disorders -hearing problems -kidney disease -recent or ongoing radiation therapy -an unusual or allergic reaction to carboplatin, cisplatin, other chemotherapy, other medicines, foods, dyes, or preservatives -pregnant or trying to get pregnant -breast-feeding How should I use this  medicine? This drug is usually given as an infusion into a vein. It is administered in a hospital or clinic by a specially trained health care professional. Talk to your pediatrician regarding the use of this medicine in children. Special care may be needed. Overdosage: If you think you have taken too much of this medicine contact a poison control center or emergency room at once. NOTE: This medicine is only for you. Do not share this medicine with others. What if I miss a dose? It is important not to miss a dose. Call your doctor or health care professional if you are unable to keep an appointment. What may interact with this medicine? -medicines for seizures -medicines to increase blood counts like filgrastim, pegfilgrastim, sargramostim -some antibiotics like amikacin, gentamicin, neomycin, streptomycin, tobramycin -vaccines Talk to your doctor or health care professional before taking any of these medicines: -acetaminophen -aspirin -ibuprofen -ketoprofen -naproxen This list may not describe all possible interactions. Give your health care provider a list of all the medicines, herbs, non-prescription drugs, or dietary supplements you use. Also tell them if you smoke, drink alcohol, or use illegal drugs. Some items may interact with your medicine. What should I watch for while using this medicine? Your condition will be monitored carefully while you are receiving this medicine. You will need important blood work done while you are taking this medicine. This drug may make you feel generally unwell. This is not uncommon, as chemotherapy can affect healthy cells as well as cancer cells. Report any side effects. Continue your course of treatment even though you feel ill unless your doctor tells you to stop. In some cases, you may be given additional medicines to help  with side effects. Follow all directions for their use. Call your doctor or health care professional for advice if you get a  fever, chills or sore throat, or other symptoms of a cold or flu. Do not treat yourself. This drug decreases your body's ability to fight infections. Try to avoid being around people who are sick. This medicine may increase your risk to bruise or bleed. Call your doctor or health care professional if you notice any unusual bleeding. Be careful brushing and flossing your teeth or using a toothpick because you may get an infection or bleed more easily. If you have any dental work done, tell your dentist you are receiving this medicine. Avoid taking products that contain aspirin, acetaminophen, ibuprofen, naproxen, or ketoprofen unless instructed by your doctor. These medicines may hide a fever. Do not become pregnant while taking this medicine. Women should inform their doctor if they wish to become pregnant or think they might be pregnant. There is a potential for serious side effects to an unborn child. Talk to your health care professional or pharmacist for more information. Do not breast-feed an infant while taking this medicine. What side effects may I notice from receiving this medicine? Side effects that you should report to your doctor or health care professional as soon as possible: -allergic reactions like skin rash, itching or hives, swelling of the face, lips, or tongue -signs of infection - fever or chills, cough, sore throat, pain or difficulty passing urine -signs of decreased platelets or bleeding - bruising, pinpoint red spots on the skin, black, tarry stools, nosebleeds -signs of decreased red blood cells - unusually weak or tired, fainting spells, lightheadedness -breathing problems -changes in hearing -changes in vision -chest pain -high blood pressure -low blood counts - This drug may decrease the number of white blood cells, red blood cells and platelets. You may be at increased risk for infections and bleeding. -nausea and vomiting -pain, swelling, redness or irritation at the  injection site -pain, tingling, numbness in the hands or feet -problems with balance, talking, walking -trouble passing urine or change in the amount of urine Side effects that usually do not require medical attention (report to your doctor or health care professional if they continue or are bothersome): -hair loss -loss of appetite -metallic taste in the mouth or changes in taste This list may not describe all possible side effects. Call your doctor for medical advice about side effects. You may report side effects to FDA at 1-800-FDA-1088. Where should I keep my medicine? This drug is given in a hospital or clinic and will not be stored at home. NOTE: This sheet is a summary. It may not cover all possible information. If you have questions about this medicine, talk to your doctor, pharmacist, or health care provider.  2015, Elsevier/Gold Standard. (2007-10-30 14:38:05)  Etoposide, VP-16 injection What is this medicine? ETOPOSIDE, VP-16 (e toe POE side) is a chemotherapy drug. It is used to treat testicular cancer, lung cancer, and other cancers. This medicine may be used for other purposes; ask your health care provider or pharmacist if you have questions. COMMON BRAND NAME(S): Etopophos, Toposar, VePesid What should I tell my health care provider before I take this medicine? They need to know if you have any of these conditions: -infection -kidney disease -low blood counts, like low white cell, platelet, or red cell counts -an unusual or allergic reaction to etoposide, other chemotherapeutic agents, other medicines, foods, dyes, or preservatives -pregnant or trying to get pregnant -  breast-feeding How should I use this medicine? This medicine is for infusion into a vein. It is administered in a hospital or clinic by a specially trained health care professional. Talk to your pediatrician regarding the use of this medicine in children. Special care may be needed. Overdosage: If you  think you have taken too much of this medicine contact a poison control center or emergency room at once. NOTE: This medicine is only for you. Do not share this medicine with others. What if I miss a dose? It is important not to miss your dose. Call your doctor or health care professional if you are unable to keep an appointment. What may interact with this medicine? -cyclosporine -medicines to increase blood counts like filgrastim, pegfilgrastim, sargramostim -vaccines This list may not describe all possible interactions. Give your health care provider a list of all the medicines, herbs, non-prescription drugs, or dietary supplements you use. Also tell them if you smoke, drink alcohol, or use illegal drugs. Some items may interact with your medicine. What should I watch for while using this medicine? Visit your doctor for checks on your progress. This drug may make you feel generally unwell. This is not uncommon, as chemotherapy can affect healthy cells as well as cancer cells. Report any side effects. Continue your course of treatment even though you feel ill unless your doctor tells you to stop. In some cases, you may be given additional medicines to help with side effects. Follow all directions for their use. Call your doctor or health care professional for advice if you get a fever, chills or sore throat, or other symptoms of a cold or flu. Do not treat yourself. This drug decreases your body's ability to fight infections. Try to avoid being around people who are sick. This medicine may increase your risk to bruise or bleed. Call your doctor or health care professional if you notice any unusual bleeding. Be careful brushing and flossing your teeth or using a toothpick because you may get an infection or bleed more easily. If you have any dental work done, tell your dentist you are receiving this medicine. Avoid taking products that contain aspirin, acetaminophen, ibuprofen, naproxen, or  ketoprofen unless instructed by your doctor. These medicines may hide a fever. Do not become pregnant while taking this medicine. Women should inform their doctor if they wish to become pregnant or think they might be pregnant. There is a potential for serious side effects to an unborn child. Talk to your health care professional or pharmacist for more information. Do not breast-feed an infant while taking this medicine. What side effects may I notice from receiving this medicine? Side effects that you should report to your doctor or health care professional as soon as possible: -allergic reactions like skin rash, itching or hives, swelling of the face, lips, or tongue -low blood counts - this medicine may decrease the number of white blood cells, red blood cells and platelets. You may be at increased risk for infections and bleeding. -signs of infection - fever or chills, cough, sore throat, pain or difficulty passing urine -signs of decreased platelets or bleeding - bruising, pinpoint red spots on the skin, black, tarry stools, blood in the urine -signs of decreased red blood cells - unusually weak or tired, fainting spells, lightheadedness -breathing problems -changes in vision -mouth or throat sores or ulcers -pain, redness, swelling or irritation at the injection site -pain, tingling, numbness in the hands or feet -redness, blistering, peeling or loosening of the  skin, including inside the mouth -seizures -vomiting Side effects that usually do not require medical attention (report to your doctor or health care professional if they continue or are bothersome): -diarrhea -hair loss -loss of appetite -nausea -stomach pain This list may not describe all possible side effects. Call your doctor for medical advice about side effects. You may report side effects to FDA at 1-800-FDA-1088. Where should I keep my medicine? This drug is given in a hospital or clinic and will not be stored at  home. NOTE: This sheet is a summary. It may not cover all possible information. If you have questions about this medicine, talk to your doctor, pharmacist, or health care provider.  2015, Elsevier/Gold Standard. (2007-11-26 17:24:12)

## 2014-12-03 ENCOUNTER — Ambulatory Visit
Admission: RE | Admit: 2014-12-03 | Discharge: 2014-12-03 | Disposition: A | Discharge status: Home / Self Care | Payer: Medicare Other | Source: Ambulatory Visit | Attending: Radiation Oncology | Admitting: Radiation Oncology

## 2014-12-03 ENCOUNTER — Ambulatory Visit (HOSPITAL_BASED_OUTPATIENT_CLINIC_OR_DEPARTMENT_OTHER): Payer: Medicare Other

## 2014-12-03 VITALS — BP 150/80 | HR 81 | Temp 97.9°F | Resp 18

## 2014-12-03 DIAGNOSIS — Z510 Encounter for antineoplastic radiation therapy: Secondary | ICD-10-CM | POA: Diagnosis not present

## 2014-12-03 DIAGNOSIS — C3412 Malignant neoplasm of upper lobe, left bronchus or lung: Secondary | ICD-10-CM | POA: Diagnosis not present

## 2014-12-03 DIAGNOSIS — Z5111 Encounter for antineoplastic chemotherapy: Secondary | ICD-10-CM | POA: Diagnosis not present

## 2014-12-03 MED ORDER — SODIUM CHLORIDE 0.9 % IV SOLN
120.0000 mg/m2 | Freq: Once | INTRAVENOUS | Status: AC
  Administered 2014-12-03: 260 mg via INTRAVENOUS
  Filled 2014-12-03: qty 13

## 2014-12-03 MED ORDER — DEXAMETHASONE SODIUM PHOSPHATE 100 MG/10ML IJ SOLN
Freq: Once | INTRAMUSCULAR | Status: AC
  Administered 2014-12-03: 10:00:00 via INTRAVENOUS
  Filled 2014-12-03: qty 4

## 2014-12-03 MED ORDER — SODIUM CHLORIDE 0.9 % IV SOLN
Freq: Once | INTRAVENOUS | Status: AC
  Administered 2014-12-03: 10:00:00 via INTRAVENOUS

## 2014-12-03 NOTE — Patient Instructions (Signed)
[facility] Discharge Instructions for Patients Receiving Chemotherapy  Today you received the following chemotherapy agents VP 16 (Etoposide) To help prevent nausea and vomiting after your treatment, we encourage you to take your nausea medication as prescribed.   If you develop nausea and vomiting that is not controlled by your nausea medication, call the clinic.   BELOW ARE SYMPTOMS THAT SHOULD BE REPORTED IMMEDIATELY:  *FEVER GREATER THAN 100.5 F  *CHILLS WITH OR WITHOUT FEVER  NAUSEA AND VOMITING THAT IS NOT CONTROLLED WITH YOUR NAUSEA MEDICATION  *UNUSUAL SHORTNESS OF BREATH  *UNUSUAL BRUISING OR BLEEDING  TENDERNESS IN MOUTH AND THROAT WITH OR WITHOUT PRESENCE OF ULCERS  *URINARY PROBLEMS  *BOWEL PROBLEMS  UNUSUAL RASH Items with * indicate a potential emergency and should be followed up as soon as possible.  Feel free to call the clinic you have any questions or concerns. The clinic phone number is [phone].  Please show the [place] [facility] at check-in to the Emergency Department and triage nurse.

## 2014-12-04 ENCOUNTER — Ambulatory Visit (HOSPITAL_BASED_OUTPATIENT_CLINIC_OR_DEPARTMENT_OTHER): Payer: Medicare Other

## 2014-12-04 ENCOUNTER — Ambulatory Visit
Admission: RE | Admit: 2014-12-04 | Discharge: 2014-12-04 | Disposition: A | Discharge status: Home / Self Care | Payer: Medicare Other | Source: Ambulatory Visit | Attending: Radiation Oncology | Admitting: Radiation Oncology

## 2014-12-04 VITALS — BP 132/67 | HR 62 | Temp 97.8°F | Resp 20

## 2014-12-04 DIAGNOSIS — Z510 Encounter for antineoplastic radiation therapy: Secondary | ICD-10-CM | POA: Diagnosis not present

## 2014-12-04 DIAGNOSIS — C3412 Malignant neoplasm of upper lobe, left bronchus or lung: Secondary | ICD-10-CM

## 2014-12-04 DIAGNOSIS — Z5111 Encounter for antineoplastic chemotherapy: Secondary | ICD-10-CM

## 2014-12-04 MED ORDER — SODIUM CHLORIDE 0.9 % IV SOLN
Freq: Once | INTRAVENOUS | Status: AC
  Administered 2014-12-04: 13:00:00 via INTRAVENOUS

## 2014-12-04 MED ORDER — ALRA NON-METALLIC DEODORANT (RAD-ONC): 1.0000 "application " | Freq: Once | TOPICAL | Status: DC

## 2014-12-04 MED ORDER — SODIUM CHLORIDE 0.9 % IV SOLN
120.0000 mg/m2 | Freq: Once | INTRAVENOUS | Status: AC
  Administered 2014-12-04: 260 mg via INTRAVENOUS
  Filled 2014-12-04: qty 13

## 2014-12-04 MED ORDER — RADIAPLEXRX EX GEL
Freq: Once | CUTANEOUS | Status: AC
  Administered 2014-12-04: 10:00:00 via TOPICAL

## 2014-12-04 MED ORDER — SODIUM CHLORIDE 0.9 % IV SOLN
Freq: Once | INTRAVENOUS | Status: AC
  Administered 2014-12-04: 13:00:00 via INTRAVENOUS
  Filled 2014-12-04: qty 4

## 2014-12-04 NOTE — Progress Notes (Signed)
Oriented patient and his wife to staff and routine of the clinic. Provided patient with RADIATION THERAPY AND YOU handbook then, reviewed pertinent information. Educated patient reference potential side effects and management such as fatigue, skin changes, and throat changes. Provided patient with RADIAPLEX and directed upon use. Answered all patient questions to the best of my ability. Patient and wife verbalized understanding of all reviewed.

## 2014-12-04 NOTE — Patient Instructions (Addendum)
[facility] Discharge Instructions for Patients Receiving Chemotherapy  Today you received the following chemotherapy agent: Etoposide   To help prevent nausea and vomiting after your treatment, we encourage you to take your nausea medication as prescribed.  You may take compazine every 6 hours as needed for nausea. You have not had this medication while in infusion room today.    If you develop nausea and vomiting that is not controlled by your nausea medication, call the clinic.   BELOW ARE SYMPTOMS THAT SHOULD BE REPORTED IMMEDIATELY:  *FEVER GREATER THAN 100.5 F  *CHILLS WITH OR WITHOUT FEVER  NAUSEA AND VOMITING THAT IS NOT CONTROLLED WITH YOUR NAUSEA MEDICATION  *UNUSUAL SHORTNESS OF BREATH  *UNUSUAL BRUISING OR BLEEDING  TENDERNESS IN MOUTH AND THROAT WITH OR WITHOUT PRESENCE OF ULCERS  *URINARY PROBLEMS  *BOWEL PROBLEMS  UNUSUAL RASH Items with * indicate a potential emergency and should be followed up as soon as possible.  Feel free to call the clinic you have any questions or concerns. The clinic phone number is [phone].  Please show the [facility] at check-in to the Emergency Department and triage nurse.

## 2014-12-05 ENCOUNTER — Ambulatory Visit
Admission: RE | Admit: 2014-12-05 | Discharge: 2014-12-05 | Disposition: A | Discharge status: Home / Self Care | Payer: Medicare Other | Source: Ambulatory Visit | Attending: Radiation Oncology | Admitting: Radiation Oncology

## 2014-12-05 ENCOUNTER — Encounter: Payer: Self-pay | Admitting: Radiation Oncology

## 2014-12-05 VITALS — BP 132/76 | HR 63 | Resp 16 | Wt 203.1 lb

## 2014-12-05 DIAGNOSIS — Z510 Encounter for antineoplastic radiation therapy: Secondary | ICD-10-CM | POA: Diagnosis not present

## 2014-12-05 DIAGNOSIS — C3412 Malignant neoplasm of upper lobe, left bronchus or lung: Secondary | ICD-10-CM

## 2014-12-05 NOTE — Progress Notes (Signed)
  Radiation Oncology         [phone] ________________________________  Name: Travis Medina MRN: [id number]  Date: 12/05/2014  DOB: [birth year]  Weekly Radiation Therapy Management    ICD-9-CM ICD-10-CM   1. Primary cancer of left upper lobe of lung 162.3 C34.12     Current Dose: 9 Gy     Planned Dose:  59.4 Gy  Narrative . . . . . . . . The patient presents for routine under treatment assessment.                                   The patient is without complaint.                                 Set-up films were reviewed.                                 The chart was checked. Physical Findings. . .  weight is 203 lb 1.6 oz (92.126 kg). His blood pressure is 132/76 and his pulse is 63. His respiration is 16 and oxygen saturation is 99%. . Weight essentially stable.  No significant changes. Impression . . . . . . . The patient is tolerating radiation. Plan . . . . . . . . . . . . Continue treatment as planned.  This document serves as a record of services personally performed by Tyler Pita, MD. It was created on his behalf by Darcus Austin, a trained medical scribe. The creation of this record is based on the scribe's personal observations and the provider's statements to them. This document has been checked and approved by the attending provider.     ________________________________  Sheral Apley. Tammi Klippel, M.D.

## 2014-12-05 NOTE — Progress Notes (Signed)
Weight and vitals stable. Denies pain. Ambulates with the aid of a cane. Denies cough, SOB or difficulty swallowing. Mild hyperpigmentation of left cheat wall noted. Reports using radiaplex bid as directed.

## 2014-12-06 ENCOUNTER — Ambulatory Visit (HOSPITAL_BASED_OUTPATIENT_CLINIC_OR_DEPARTMENT_OTHER): Payer: Medicare Other

## 2014-12-06 VITALS — BP 119/63 | HR 76 | Temp 96.7°F

## 2014-12-06 DIAGNOSIS — Z5189 Encounter for other specified aftercare: Secondary | ICD-10-CM | POA: Diagnosis not present

## 2014-12-06 DIAGNOSIS — C3412 Malignant neoplasm of upper lobe, left bronchus or lung: Secondary | ICD-10-CM

## 2014-12-06 MED ORDER — PEGFILGRASTIM INJECTION 6 MG/0.6ML ~~LOC~~
6.0000 mg | PREFILLED_SYRINGE | Freq: Once | SUBCUTANEOUS | Status: AC
  Administered 2014-12-06: 6 mg via SUBCUTANEOUS

## 2014-12-06 NOTE — Patient Instructions (Signed)
[facility] Discharge Instructions for Patients Receiving Neulasta  Today you received the following Neulasta To help prevent nausea and vomiting after your treatment, we encourage you to take your nausea medication per Dr. [name].  If you develop nausea and vomiting that is not controlled by your nausea medication, call the clinic.   BELOW ARE SYMPTOMS THAT SHOULD BE REPORTED IMMEDIATELY:  *FEVER GREATER THAN 100.5 F  *CHILLS WITH OR WITHOUT FEVER  NAUSEA AND VOMITING THAT IS NOT CONTROLLED WITH YOUR NAUSEA MEDICATION  *UNUSUAL SHORTNESS OF BREATH  *UNUSUAL BRUISING OR BLEEDING  TENDERNESS IN MOUTH AND THROAT WITH OR WITHOUT PRESENCE OF ULCERS  *URINARY PROBLEMS  *BOWEL PROBLEMS  UNUSUAL RASH Items with * indicate a potential emergency and should be followed up as soon as possible.  Feel free to call the clinic you have any questions or concerns. The clinic phone number is [phone].  Please show the [facility] at check-in to the Emergency Department and triage nurse.  Pegfilgrastim injection What is this medicine? PEGFILGRASTIM (peg fil GRA stim) is a long-acting granulocyte colony-stimulating factor that stimulates the growth of neutrophils, a type of white blood cell important in the body's fight against infection. It is used to reduce the incidence of fever and infection in patients with certain types of cancer who are receiving chemotherapy that affects the bone marrow. This medicine may be used for other purposes; ask your health care provider or pharmacist if you have questions. COMMON BRAND NAME(S): Neulasta What should I tell my health care provider before I take this medicine? They need to know if you have any of these conditions: -latex allergy -ongoing radiation therapy -sickle cell disease -skin reactions to acrylic adhesives (On-Body Injector only) -an unusual or allergic reaction to pegfilgrastim, filgrastim, other medicines, foods,  dyes, or preservatives -pregnant or trying to get pregnant -breast-feeding How should I use this medicine? This medicine is for injection under the skin. If you get this medicine at home, you will be taught how to prepare and give the pre-filled syringe or how to use the On-body Injector. Refer to the patient Instructions for Use for detailed instructions. Use exactly as directed. Take your medicine at regular intervals. Do not take your medicine more often than directed. It is important that you put your used needles and syringes in a special sharps container. Do not put them in a trash can. If you do not have a sharps container, call your pharmacist or healthcare provider to get one. Talk to your pediatrician regarding the use of this medicine in children. Special care may be needed. Overdosage: If you think you have taken too much of this medicine contact a poison control center or emergency room at once. NOTE: This medicine is only for you. Do not share this medicine with others. What if I miss a dose? It is important not to miss your dose. Call your doctor or health care professional if you miss your dose. If you miss a dose due to an On-body Injector failure or leakage, a new dose should be administered as soon as possible using a single prefilled syringe for manual use. What may interact with this medicine? Interactions have not been studied. Give your health care provider a list of all the medicines, herbs, non-prescription drugs, or dietary supplements you use. Also tell them if you smoke, drink alcohol, or use illegal drugs. Some items may interact with your medicine. This list may not describe all possible interactions. Give your  health care provider a list of all the medicines, herbs, non-prescription drugs, or dietary supplements you use. Also tell them if you smoke, drink alcohol, or use illegal drugs. Some items may interact with your medicine. What should I watch for while using this  medicine? You may need blood work done while you are taking this medicine. If you are going to need a MRI, CT scan, or other procedure, tell your doctor that you are using this medicine (On-Body Injector only). What side effects may I notice from receiving this medicine? Side effects that you should report to your doctor or health care professional as soon as possible: -allergic reactions like skin rash, itching or hives, swelling of the face, lips, or tongue -dizziness -fever -pain, redness, or irritation at site where injected -pinpoint red spots on the skin -shortness of breath or breathing problems -stomach or side pain, or pain at the shoulder -swelling -tiredness -trouble passing urine Side effects that usually do not require medical attention (report to your doctor or health care professional if they continue or are bothersome): -bone pain -muscle pain This list may not describe all possible side effects. Call your doctor for medical advice about side effects. You may report side effects to FDA at 1-800-FDA-1088. Where should I keep my medicine? Keep out of the reach of children. Store pre-filled syringes in a refrigerator between 2 and 8 degrees C (36 and 46 degrees F). Do not freeze. Keep in carton to protect from light. Throw away this medicine if it is left out of the refrigerator for more than 48 hours. Throw away any unused medicine after the expiration date. NOTE: This sheet is a summary. It may not cover all possible information. If you have questions about this medicine, talk to your doctor, pharmacist, or health care provider.  2015, Elsevier/Gold Standard. (2013-10-24 16:14:05)

## 2014-12-08 ENCOUNTER — Ambulatory Visit
Admission: RE | Admit: 2014-12-08 | Discharge: 2014-12-08 | Disposition: A | Discharge status: Home / Self Care | Payer: Medicare Other | Source: Ambulatory Visit | Attending: Radiation Oncology | Admitting: Radiation Oncology

## 2014-12-08 DIAGNOSIS — Z510 Encounter for antineoplastic radiation therapy: Secondary | ICD-10-CM | POA: Diagnosis not present

## 2014-12-09 ENCOUNTER — Other Ambulatory Visit (HOSPITAL_BASED_OUTPATIENT_CLINIC_OR_DEPARTMENT_OTHER): Payer: Medicare Other

## 2014-12-09 ENCOUNTER — Ambulatory Visit
Admission: RE | Admit: 2014-12-09 | Discharge: 2014-12-09 | Disposition: A | Discharge status: Home / Self Care | Payer: Medicare Other | Source: Ambulatory Visit | Attending: Radiation Oncology | Admitting: Radiation Oncology

## 2014-12-09 DIAGNOSIS — C3492 Malignant neoplasm of unspecified part of left bronchus or lung: Secondary | ICD-10-CM

## 2014-12-09 DIAGNOSIS — C3412 Malignant neoplasm of upper lobe, left bronchus or lung: Secondary | ICD-10-CM

## 2014-12-09 DIAGNOSIS — Z510 Encounter for antineoplastic radiation therapy: Secondary | ICD-10-CM | POA: Diagnosis not present

## 2014-12-09 LAB — COMPREHENSIVE METABOLIC PANEL (CC13)
ALBUMIN: 3.8 g/dL (ref 3.5–5.0)
ALK PHOS: 90 U/L (ref 40–150)
ALT: 18 U/L (ref 0–55)
ANION GAP: 10 meq/L (ref 3–11)
AST: 11 U/L (ref 5–34)
BUN: 29.1 mg/dL — AB (ref 7.0–26.0)
CO2: 21 meq/L — AB (ref 22–29)
Calcium: 9.4 mg/dL (ref 8.4–10.4)
Chloride: 103 mEq/L (ref 98–109)
Creatinine: 1.1 mg/dL (ref 0.7–1.3)
EGFR: 63 mL/min/{1.73_m2} — AB (ref >90)
GLUCOSE: 106 mg/dL (ref 70–140)
Potassium: 4.9 mEq/L (ref 3.5–5.1)
SODIUM: 134 meq/L — AB (ref 136–145)
TOTAL PROTEIN: 7.1 g/dL (ref 6.4–8.3)
Total Bilirubin: 1.87 mg/dL — ABNORMAL HIGH (ref 0.20–1.20)

## 2014-12-09 LAB — CBC WITH DIFFERENTIAL/PLATELET
BASO%: 0.2 % (ref 0.0–2.0)
BASOS ABS: 0 10*3/uL (ref 0.0–0.1)
EOS%: 2.4 % (ref 0.0–7.0)
Eosinophils Absolute: 0.1 10*3/uL (ref 0.0–0.5)
HCT: 35.2 % — ABNORMAL LOW (ref 38.4–49.9)
HEMOGLOBIN: 11.9 g/dL — AB (ref 13.0–17.1)
LYMPH#: 0.9 10*3/uL (ref 0.9–3.3)
LYMPH%: 21.9 % (ref 14.0–49.0)
MCH: 30.7 pg (ref 27.2–33.4)
MCHC: 33.8 g/dL (ref 32.0–36.0)
MCV: 90.7 fL (ref 79.3–98.0)
MONO#: 0 10*3/uL — ABNORMAL LOW (ref 0.1–0.9)
MONO%: 0.5 % (ref 0.0–14.0)
NEUT%: 75 % (ref 39.0–75.0)
NEUTROS ABS: 3.2 10*3/uL (ref 1.5–6.5)
Platelets: 135 10*3/uL — ABNORMAL LOW (ref 140–400)
RBC: 3.88 10*6/uL — ABNORMAL LOW (ref 4.20–5.82)
RDW: 12.4 % (ref 11.0–14.6)
WBC: 4.2 10*3/uL (ref 4.0–10.3)

## 2014-12-10 ENCOUNTER — Encounter: Payer: Self-pay | Admitting: Radiation Oncology

## 2014-12-10 ENCOUNTER — Ambulatory Visit
Admission: RE | Admit: 2014-12-10 | Discharge: 2014-12-10 | Disposition: A | Discharge status: Home / Self Care | Payer: Medicare Other | Source: Ambulatory Visit | Attending: Radiation Oncology | Admitting: Radiation Oncology

## 2014-12-10 VITALS — BP 129/78 | HR 74 | Temp 97.8°F | Resp 16

## 2014-12-10 DIAGNOSIS — C3412 Malignant neoplasm of upper lobe, left bronchus or lung: Secondary | ICD-10-CM

## 2014-12-10 DIAGNOSIS — Z510 Encounter for antineoplastic radiation therapy: Secondary | ICD-10-CM | POA: Diagnosis not present

## 2014-12-10 MED ORDER — RADIAPLEXRX EX GEL
Freq: Once | CUTANEOUS | Status: AC
  Administered 2014-12-10: 15:00:00 via TOPICAL

## 2014-12-10 NOTE — Progress Notes (Signed)
Weekly Management Note Current Dose:14.4 Gy  Projected Dose:59.4 Gy   Narrative:  The patient presents for routine under treatment assessment.  CBCT/MVCT images/Port film x-rays were reviewed.  The chart was checked. Doing well. Sore throat last night. REsponded to water and not present this am.   Physical Findings:  Unchanged  Vitals:  Filed Vitals:   12/10/14 0952  BP: 129/78  Pulse: 74  Temp: 97.8 F (36.6 C)  Resp: 16   Weight:  Wt Readings from Last 3 Encounters:  11/27/14 201 lb 11.2 oz (91.491 kg)  11/21/14 198 lb (89.812 kg)  11/20/14 198 lb 4.8 oz (89.948 kg)   Lab Results  Component Value Date   WBC 4.2 12/09/2014   HGB 11.9* 12/09/2014   HCT 35.2* 12/09/2014   MCV 90.7 12/09/2014   PLT 135* 12/09/2014   Lab Results  Component Value Date   CREATININE 1.1 12/09/2014   BUN 29.1* 12/09/2014   NA 134* 12/09/2014   K 4.9 12/09/2014   CL 101 10/25/2014   CO2 21* 12/09/2014     Impression:  The patient is tolerating radiation.   Plan:  Continue treatment as planned. Doing well. Likely due to open mouth breathing/allergies and not RT. Let us know if it recurs. Will see MAM on Firday.    This document serves as a record of services personally performed by Thea Silversmith, MD. It was created on her behalf by Arlyce Harman, a trained medical scribe. The creation of this record is based on the scribe's personal observations and the provider's statements to them. This document has been checked and approved by the attending provider.

## 2014-12-10 NOTE — Addendum Note (Signed)
Encounter addended by: Heywood Footman, RN on: 12/10/2014  2:43 PM<BR>     Documentation filed: [facility], Inpatient MAR, Orders

## 2014-12-10 NOTE — Progress Notes (Signed)
Weight and vitals stable. Denies pain. Reports occasional dry cough. Denies SOB. Reports new onset sore throat and hoarseness. Mild hyperpigmentation of left chest wall noted. Reports using radiaplex bid as directed.

## 2014-12-10 NOTE — Progress Notes (Signed)
Encouraged patient to use a humidifier in his room while sleeping and apply vaseline just inside his nares before bed. Will assess patient again Friday for improvement.

## 2014-12-11 ENCOUNTER — Ambulatory Visit
Admission: RE | Admit: 2014-12-11 | Discharge: 2014-12-11 | Disposition: A | Discharge status: Home / Self Care | Payer: Medicare Other | Source: Ambulatory Visit | Attending: Radiation Oncology | Admitting: Radiation Oncology

## 2014-12-11 DIAGNOSIS — Z510 Encounter for antineoplastic radiation therapy: Secondary | ICD-10-CM | POA: Diagnosis not present

## 2014-12-12 ENCOUNTER — Ambulatory Visit
Admission: RE | Admit: 2014-12-12 | Discharge: 2014-12-12 | Disposition: A | Discharge status: Home / Self Care | Payer: Medicare Other | Source: Ambulatory Visit | Attending: Radiation Oncology | Admitting: Radiation Oncology

## 2014-12-12 ENCOUNTER — Encounter: Payer: Self-pay | Admitting: Radiation Oncology

## 2014-12-12 VITALS — BP 123/77 | HR 76 | Resp 16 | Wt 198.5 lb

## 2014-12-12 DIAGNOSIS — K209 Esophagitis, unspecified without bleeding: Secondary | ICD-10-CM

## 2014-12-12 DIAGNOSIS — Z510 Encounter for antineoplastic radiation therapy: Secondary | ICD-10-CM | POA: Diagnosis not present

## 2014-12-12 DIAGNOSIS — C3412 Malignant neoplasm of upper lobe, left bronchus or lung: Secondary | ICD-10-CM

## 2014-12-12 MED ORDER — SUCRALFATE 1 G PO TABS: 1.0000 g | ORAL_TABLET | Freq: Three times a day (TID) | ORAL | Status: DC

## 2014-12-12 NOTE — Progress Notes (Signed)
  Radiation Oncology         [phone]) [phone] ________________________________  Name: Travis Medina MRN: [id number]  Date: 12/12/2014  DOB: [birth year]  Weekly Radiation Therapy Management    ICD-9-CM ICD-10-CM   1. Primary cancer of left upper lobe of lung 162.3 C34.12     Current Dose: 18 Gy     Planned Dose:  59.4 Gy  Narrative . . . . . . . . The patient presents for routine under treatment assessment. Encouraged patient to use a humidifier in his room while sleeping and apply vaseline just inside his nares before bed. Will assess patient again Friday for improvement. Notice a few episodes of esophageal symptoms. Set-up films were reviewed. The chart was checked.  Physical Findings. . .  weight is 198 lb 8 oz (90.039 kg). His blood pressure is 123/77 and his pulse is 76. His respiration is 16 and oxygen saturation is 100%. . Weight essentially stable.  No significant changes.  Impression . . . . . . . The patient is tolerating radiation.  Plan . . . . . . . . . . . . Continue treatment as planned. Start Carafate. Next visit next Friday.  This document serves as a record of services personally performed by Tyler Pita, MD. It was created on his behalf by Jeralene Peters, a trained medical scribe. The creation of this record is based on the scribe's personal observations and the provider's statements to them. This document has been checked and approved by the attending provider.     ________________________________  Sheral Apley. Tammi Klippel, M.D.

## 2014-12-12 NOTE — Progress Notes (Signed)
Weight and vitals stable. Denies pain. Reports an occasional dry cough. Denies SOB. Reports sore throat has resolved. Hoarseness continues. Mild hyperpigmentation of left chest wall noted. Reports using radiaplex bid as directed.

## 2014-12-15 ENCOUNTER — Ambulatory Visit
Admission: RE | Admit: 2014-12-15 | Discharge: 2014-12-15 | Disposition: A | Discharge status: Home / Self Care | Payer: Medicare Other | Source: Ambulatory Visit | Attending: Radiation Oncology | Admitting: Radiation Oncology

## 2014-12-15 DIAGNOSIS — Z510 Encounter for antineoplastic radiation therapy: Secondary | ICD-10-CM | POA: Diagnosis not present

## 2014-12-16 ENCOUNTER — Other Ambulatory Visit (HOSPITAL_BASED_OUTPATIENT_CLINIC_OR_DEPARTMENT_OTHER): Payer: Medicare Other

## 2014-12-16 ENCOUNTER — Ambulatory Visit
Admission: RE | Admit: 2014-12-16 | Discharge: 2014-12-16 | Disposition: A | Discharge status: Home / Self Care | Payer: Medicare Other | Source: Ambulatory Visit | Attending: Radiation Oncology | Admitting: Radiation Oncology

## 2014-12-16 DIAGNOSIS — Z510 Encounter for antineoplastic radiation therapy: Secondary | ICD-10-CM | POA: Diagnosis not present

## 2014-12-16 DIAGNOSIS — C3492 Malignant neoplasm of unspecified part of left bronchus or lung: Secondary | ICD-10-CM | POA: Diagnosis not present

## 2014-12-16 LAB — COMPREHENSIVE METABOLIC PANEL (CC13)
ALBUMIN: 3.4 g/dL — AB (ref 3.5–5.0)
ALK PHOS: 85 U/L (ref 40–150)
ALT: 16 U/L (ref 0–55)
AST: 15 U/L (ref 5–34)
Anion Gap: 7 mEq/L (ref 3–11)
BUN: 20.8 mg/dL (ref 7.0–26.0)
CO2: 23 meq/L (ref 22–29)
Calcium: 9.3 mg/dL (ref 8.4–10.4)
Chloride: 105 mEq/L (ref 98–109)
Creatinine: 1.4 mg/dL — ABNORMAL HIGH (ref 0.7–1.3)
EGFR: 49 mL/min/{1.73_m2} — ABNORMAL LOW (ref >90)
Glucose: 112 mg/dl (ref 70–140)
Potassium: 4.8 mEq/L (ref 3.5–5.1)
Sodium: 134 mEq/L — ABNORMAL LOW (ref 136–145)
TOTAL PROTEIN: 6.4 g/dL (ref 6.4–8.3)

## 2014-12-16 LAB — CBC WITH DIFFERENTIAL/PLATELET
BASO%: 0.5 % (ref 0.0–2.0)
Basophils Absolute: 0.1 10*3/uL (ref 0.0–0.1)
EOS ABS: 0.1 10*3/uL (ref 0.0–0.5)
EOS%: 0.6 % (ref 0.0–7.0)
HEMATOCRIT: 31.7 % — AB (ref 38.4–49.9)
HGB: 10.3 g/dL — ABNORMAL LOW (ref 13.0–17.1)
LYMPH%: 12.2 % — ABNORMAL LOW (ref 14.0–49.0)
MCH: 29.8 pg (ref 27.2–33.4)
MCHC: 32.4 g/dL (ref 32.0–36.0)
MCV: 92 fL (ref 79.3–98.0)
MONO#: 1 10*3/uL — AB (ref 0.1–0.9)
MONO%: 8.6 % (ref 0.0–14.0)
NEUT#: 8.7 10*3/uL — ABNORMAL HIGH (ref 1.5–6.5)
NEUT%: 78.1 % — AB (ref 39.0–75.0)
Platelets: 69 10*3/uL — ABNORMAL LOW (ref 140–400)
RBC: 3.45 10*6/uL — AB (ref 4.20–5.82)
RDW: 12.8 % (ref 11.0–14.6)
WBC: 11.1 10*3/uL — ABNORMAL HIGH (ref 4.0–10.3)
lymph#: 1.4 10*3/uL (ref 0.9–3.3)

## 2014-12-17 ENCOUNTER — Ambulatory Visit
Admission: RE | Admit: 2014-12-17 | Discharge: 2014-12-17 | Disposition: A | Discharge status: Home / Self Care | Payer: Medicare Other | Source: Ambulatory Visit | Attending: Radiation Oncology | Admitting: Radiation Oncology

## 2014-12-17 DIAGNOSIS — Z510 Encounter for antineoplastic radiation therapy: Secondary | ICD-10-CM | POA: Diagnosis not present

## 2014-12-18 ENCOUNTER — Ambulatory Visit
Admission: RE | Admit: 2014-12-18 | Discharge: 2014-12-18 | Disposition: A | Discharge status: Home / Self Care | Payer: Medicare Other | Source: Ambulatory Visit | Attending: Radiation Oncology | Admitting: Radiation Oncology

## 2014-12-18 DIAGNOSIS — Z510 Encounter for antineoplastic radiation therapy: Secondary | ICD-10-CM | POA: Diagnosis not present

## 2014-12-19 ENCOUNTER — Ambulatory Visit
Admission: RE | Admit: 2014-12-19 | Discharge: 2014-12-19 | Disposition: A | Discharge status: Home / Self Care | Payer: Medicare Other | Source: Ambulatory Visit | Attending: Radiation Oncology | Admitting: Radiation Oncology

## 2014-12-19 ENCOUNTER — Encounter: Payer: Self-pay | Admitting: Radiation Oncology

## 2014-12-19 VITALS — BP 120/56 | HR 70 | Resp 16 | Wt 202.0 lb

## 2014-12-19 DIAGNOSIS — Z510 Encounter for antineoplastic radiation therapy: Secondary | ICD-10-CM | POA: Diagnosis not present

## 2014-12-19 DIAGNOSIS — C3412 Malignant neoplasm of upper lobe, left bronchus or lung: Secondary | ICD-10-CM

## 2014-12-19 NOTE — Progress Notes (Signed)
   Department of Radiation Oncology  Phone:  [phone] Fax:        [phone]  Weekly Treatment Note    Name: Travis Medina Date: 12/19/2014 MRN: [id number] DOB: Oct 05, 1933   Current dose: 27 Gy  Current fraction: 15   MEDICATIONS: Current Outpatient Prescriptions  Medication Sig Dispense Refill  . acetaminophen (TYLENOL) 500 MG tablet Take 1 tablet (500 mg total) by mouth every 6 (six) hours as needed for mild pain. 30 tablet 0  . aspirin 325 MG tablet Take 325 mg by mouth daily.    [name] lisinopril (PRINIVIL,ZESTRIL) 5 MG tablet Take 5 mg by mouth daily.    . non-metallic deodorant Jethro Poling) MISC Apply 1 application topically daily as needed.    . prochlorperazine (COMPAZINE) 10 MG tablet Take 1 tablet (10 mg total) by mouth every 6 (six) hours as needed for nausea or vomiting. 60 tablet 0  . sucralfate (CARAFATE) 1 G tablet Take 1 tablet (1 g total) by mouth 4 (four) times daily -  with meals and at bedtime. 5 min before food 90 tablet 5  . Wound Cleansers (RADIAPLEX EX) Apply topically.     No current facility-administered medications for this encounter.     ALLERGIES: Morphine and related   LABORATORY DATA:  Lab Results  Component Value Date   WBC 11.1* 12/16/2014   HGB 10.3* 12/16/2014   HCT 31.7* 12/16/2014   MCV 92.0 12/16/2014   PLT 69* 12/16/2014   Lab Results  Component Value Date   NA 134* 12/16/2014   K 4.8 12/16/2014   CL 101 10/25/2014   CO2 23 12/16/2014   Lab Results  Component Value Date   ALT 16 12/16/2014   AST 15 12/16/2014   ALKPHOS 85 12/16/2014   BILITOT <0.20 12/16/2014     NARRATIVE: Travis Medina was seen today for weekly treatment management. The chart was checked and the patient's films were reviewed.  Weight and vitals stable. Denies pain. Reports an occasional dry cough. Denies SOB. Reports occasionally his food gets "hung up when he swallows." Hoarseness continues. Mild hyperpigmentation of left chest wall and upper back  noted. Reports using radiaplex bid as directed. Advised to use carafate for esophagitis.    PHYSICAL EXAMINATION: weight is 202 lb (91.627 kg). His blood pressure is 120/56 and his pulse is 70. His respiration is 16 and oxygen saturation is 100%.        ASSESSMENT: The patient is doing satisfactorily with treatment.   PLAN: We will continue with the patient's radiation treatment as planned.        This document serves as a record of services personally performed by Kyung Rudd, MD. It was created on his behalf by Derek Mound, a trained medical scribe. The creation of this record is based on the scribe's personal observations and the provider's statements to them. This document has been checked and approved by the attending provider.

## 2014-12-19 NOTE — Progress Notes (Signed)
Weight and vitals stable. Denies pain. Reports an occasional dry cough. Denies SOB. Reports occasionally his food gets "hung up when he swallows." Hoarseness continues. Mild hyperpigmentation of left chest wall and upper back noted. Reports using radiaplex bid as directed.

## 2014-12-22 ENCOUNTER — Ambulatory Visit
Admission: RE | Admit: 2014-12-22 | Discharge: 2014-12-22 | Disposition: A | Discharge status: Home / Self Care | Payer: Medicare Other | Source: Ambulatory Visit | Attending: Radiation Oncology | Admitting: Radiation Oncology

## 2014-12-22 ENCOUNTER — Other Ambulatory Visit: Payer: Self-pay | Admitting: Internal Medicine

## 2014-12-22 DIAGNOSIS — Z510 Encounter for antineoplastic radiation therapy: Secondary | ICD-10-CM | POA: Diagnosis not present

## 2014-12-23 ENCOUNTER — Ambulatory Visit
Admission: RE | Admit: 2014-12-23 | Discharge: 2014-12-23 | Disposition: A | Discharge status: Home / Self Care | Payer: Medicare Other | Source: Ambulatory Visit | Attending: Radiation Oncology | Admitting: Radiation Oncology

## 2014-12-23 ENCOUNTER — Ambulatory Visit (HOSPITAL_BASED_OUTPATIENT_CLINIC_OR_DEPARTMENT_OTHER): Payer: Medicare Other

## 2014-12-23 ENCOUNTER — Other Ambulatory Visit (HOSPITAL_BASED_OUTPATIENT_CLINIC_OR_DEPARTMENT_OTHER): Payer: Medicare Other

## 2014-12-23 VITALS — BP 138/69 | HR 69 | Temp 98.4°F | Resp 18

## 2014-12-23 DIAGNOSIS — Z510 Encounter for antineoplastic radiation therapy: Secondary | ICD-10-CM | POA: Diagnosis not present

## 2014-12-23 DIAGNOSIS — C3412 Malignant neoplasm of upper lobe, left bronchus or lung: Secondary | ICD-10-CM

## 2014-12-23 DIAGNOSIS — Z5111 Encounter for antineoplastic chemotherapy: Secondary | ICD-10-CM | POA: Diagnosis not present

## 2014-12-23 LAB — COMPREHENSIVE METABOLIC PANEL (CC13)
ALBUMIN: 3.3 g/dL — AB (ref 3.5–5.0)
ALK PHOS: 77 U/L (ref 40–150)
ALT: 13 U/L (ref 0–55)
ANION GAP: 11 meq/L (ref 3–11)
AST: 14 U/L (ref 5–34)
BILIRUBIN TOTAL: 0.22 mg/dL (ref 0.20–1.20)
BUN: 17.8 mg/dL (ref 7.0–26.0)
CO2: 23 meq/L (ref 22–29)
CREATININE: 1.2 mg/dL (ref 0.7–1.3)
Calcium: 9.3 mg/dL (ref 8.4–10.4)
Chloride: 103 mEq/L (ref 98–109)
EGFR: 57 mL/min/{1.73_m2} — AB (ref >90)
GLUCOSE: 114 mg/dL (ref 70–140)
Potassium: 4.6 mEq/L (ref 3.5–5.1)
Sodium: 137 mEq/L (ref 136–145)
Total Protein: 6.7 g/dL (ref 6.4–8.3)

## 2014-12-23 LAB — CBC WITH DIFFERENTIAL/PLATELET
BASO%: 0.2 % (ref 0.0–2.0)
Basophils Absolute: 0 10*3/uL (ref 0.0–0.1)
EOS ABS: 0 10*3/uL (ref 0.0–0.5)
EOS%: 0.1 % (ref 0.0–7.0)
HCT: 30.4 % — ABNORMAL LOW (ref 38.4–49.9)
HGB: 10.2 g/dL — ABNORMAL LOW (ref 13.0–17.1)
LYMPH%: 9.8 % — AB (ref 14.0–49.0)
MCH: 30.6 pg (ref 27.2–33.4)
MCHC: 33.6 g/dL (ref 32.0–36.0)
MCV: 91.3 fL (ref 79.3–98.0)
MONO#: 1.2 10*3/uL — ABNORMAL HIGH (ref 0.1–0.9)
MONO%: 11.8 % (ref 0.0–14.0)
NEUT#: 8.1 10*3/uL — ABNORMAL HIGH (ref 1.5–6.5)
NEUT%: 78.1 % — ABNORMAL HIGH (ref 39.0–75.0)
PLATELETS: 336 10*3/uL (ref 140–400)
RBC: 3.33 10*6/uL — AB (ref 4.20–5.82)
RDW: 12.6 % (ref 11.0–14.6)
WBC: 10.4 10*3/uL — AB (ref 4.0–10.3)
lymph#: 1 10*3/uL (ref 0.9–3.3)

## 2014-12-23 MED ORDER — SODIUM CHLORIDE 0.9 % IV SOLN
Freq: Once | INTRAVENOUS | Status: AC
  Administered 2014-12-23: 10:00:00 via INTRAVENOUS

## 2014-12-23 MED ORDER — SODIUM CHLORIDE 0.9 % IV SOLN
120.0000 mg/m2 | Freq: Once | INTRAVENOUS | Status: AC
  Administered 2014-12-23: 260 mg via INTRAVENOUS
  Filled 2014-12-23: qty 13

## 2014-12-23 MED ORDER — SODIUM CHLORIDE 0.9 % IV SOLN
Freq: Once | INTRAVENOUS | Status: AC
  Administered 2014-12-23: 11:00:00 via INTRAVENOUS
  Filled 2014-12-23: qty 8

## 2014-12-23 MED ORDER — SODIUM CHLORIDE 0.9 % IV SOLN
406.5000 mg | Freq: Once | INTRAVENOUS | Status: AC
  Administered 2014-12-23: 410 mg via INTRAVENOUS
  Filled 2014-12-23: qty 41

## 2014-12-23 NOTE — Patient Instructions (Signed)
[facility] Discharge Instructions for Patients Receiving Chemotherapy  Today you received the following chemotherapy agents Carboplatin, Vepesid   To help prevent nausea and vomiting after your treatment, we encourage you to take your nausea medication as directed.    If you develop nausea and vomiting that is not controlled by your nausea medication, call the clinic.   BELOW ARE SYMPTOMS THAT SHOULD BE REPORTED IMMEDIATELY:  *FEVER GREATER THAN 100.5 F  *CHILLS WITH OR WITHOUT FEVER  NAUSEA AND VOMITING THAT IS NOT CONTROLLED WITH YOUR NAUSEA MEDICATION  *UNUSUAL SHORTNESS OF BREATH  *UNUSUAL BRUISING OR BLEEDING  TENDERNESS IN MOUTH AND THROAT WITH OR WITHOUT PRESENCE OF ULCERS  *URINARY PROBLEMS  *BOWEL PROBLEMS  UNUSUAL RASH Items with * indicate a potential emergency and should be followed up as soon as possible.  Feel free to call the clinic you have any questions or concerns. The clinic phone number is [phone].  Please show the [facility] at check-in to the Emergency Department and triage nurse.

## 2014-12-24 ENCOUNTER — Ambulatory Visit
Admission: RE | Admit: 2014-12-24 | Discharge: 2014-12-24 | Disposition: A | Discharge status: Home / Self Care | Payer: Medicare Other | Source: Ambulatory Visit | Attending: Radiation Oncology | Admitting: Radiation Oncology

## 2014-12-24 ENCOUNTER — Ambulatory Visit (HOSPITAL_BASED_OUTPATIENT_CLINIC_OR_DEPARTMENT_OTHER): Payer: Medicare Other

## 2014-12-24 VITALS — BP 124/64 | HR 67 | Temp 98.1°F | Resp 24

## 2014-12-24 DIAGNOSIS — C3412 Malignant neoplasm of upper lobe, left bronchus or lung: Secondary | ICD-10-CM

## 2014-12-24 DIAGNOSIS — Z5111 Encounter for antineoplastic chemotherapy: Secondary | ICD-10-CM | POA: Diagnosis not present

## 2014-12-24 DIAGNOSIS — C7A1 Malignant poorly differentiated neuroendocrine tumors: Secondary | ICD-10-CM

## 2014-12-24 DIAGNOSIS — Z510 Encounter for antineoplastic radiation therapy: Secondary | ICD-10-CM | POA: Diagnosis not present

## 2014-12-24 MED ORDER — SODIUM CHLORIDE 0.9 % IV SOLN
Freq: Once | INTRAVENOUS | Status: AC
  Administered 2014-12-24: 10:00:00 via INTRAVENOUS
  Filled 2014-12-24: qty 4

## 2014-12-24 MED ORDER — SODIUM CHLORIDE 0.9 % IV SOLN
Freq: Once | INTRAVENOUS | Status: AC
  Administered 2014-12-24: 10:00:00 via INTRAVENOUS

## 2014-12-24 MED ORDER — SODIUM CHLORIDE 0.9 % IV SOLN
120.0000 mg/m2 | Freq: Once | INTRAVENOUS | Status: AC
  Administered 2014-12-24: 260 mg via INTRAVENOUS
  Filled 2014-12-24: qty 13

## 2014-12-24 NOTE — Patient Instructions (Signed)
[facility] Discharge Instructions for Patients Receiving Chemotherapy  Today you received the following chemotherapy agents Vepesid.  To help prevent nausea and vomiting after your treatment, we encourage you to take your nausea medication as directed.    If you develop nausea and vomiting that is not controlled by your nausea medication, call the clinic.   BELOW ARE SYMPTOMS THAT SHOULD BE REPORTED IMMEDIATELY:  *FEVER GREATER THAN 100.5 F  *CHILLS WITH OR WITHOUT FEVER  NAUSEA AND VOMITING THAT IS NOT CONTROLLED WITH YOUR NAUSEA MEDICATION  *UNUSUAL SHORTNESS OF BREATH  *UNUSUAL BRUISING OR BLEEDING  TENDERNESS IN MOUTH AND THROAT WITH OR WITHOUT PRESENCE OF ULCERS  *URINARY PROBLEMS  *BOWEL PROBLEMS  UNUSUAL RASH Items with * indicate a potential emergency and should be followed up as soon as possible.  Feel free to call the clinic you have any questions or concerns. The clinic phone number is [phone].  Please show the [facility] at check-in to the Emergency Department and triage nurse.

## 2014-12-25 ENCOUNTER — Ambulatory Visit
Admission: RE | Admit: 2014-12-25 | Discharge: 2014-12-25 | Disposition: A | Discharge status: Home / Self Care | Payer: Medicare Other | Source: Ambulatory Visit | Attending: Radiation Oncology | Admitting: Radiation Oncology

## 2014-12-25 ENCOUNTER — Encounter: Payer: Self-pay | Admitting: Radiation Oncology

## 2014-12-25 ENCOUNTER — Ambulatory Visit (HOSPITAL_BASED_OUTPATIENT_CLINIC_OR_DEPARTMENT_OTHER): Payer: Medicare Other

## 2014-12-25 VITALS — BP 129/65 | HR 60 | Temp 97.9°F | Resp 18 | Ht 70.0 in

## 2014-12-25 VITALS — BP 141/68 | HR 66 | Resp 16 | Wt 207.4 lb

## 2014-12-25 DIAGNOSIS — C3412 Malignant neoplasm of upper lobe, left bronchus or lung: Secondary | ICD-10-CM | POA: Diagnosis not present

## 2014-12-25 DIAGNOSIS — Z5111 Encounter for antineoplastic chemotherapy: Secondary | ICD-10-CM | POA: Diagnosis not present

## 2014-12-25 DIAGNOSIS — Z510 Encounter for antineoplastic radiation therapy: Secondary | ICD-10-CM | POA: Diagnosis not present

## 2014-12-25 MED ORDER — SODIUM CHLORIDE 0.9 % IV SOLN
Freq: Once | INTRAVENOUS | Status: AC
  Administered 2014-12-25: 10:00:00 via INTRAVENOUS

## 2014-12-25 MED ORDER — RADIAPLEXRX EX GEL
Freq: Once | CUTANEOUS | Status: AC
  Administered 2014-12-25: 12:00:00 via TOPICAL

## 2014-12-25 MED ORDER — SODIUM CHLORIDE 0.9 % IV SOLN
120.0000 mg/m2 | Freq: Once | INTRAVENOUS | Status: AC
  Administered 2014-12-25: 260 mg via INTRAVENOUS
  Filled 2014-12-25: qty 13

## 2014-12-25 MED ORDER — SODIUM CHLORIDE 0.9 % IV SOLN
Freq: Once | INTRAVENOUS | Status: AC
  Administered 2014-12-25: 10:00:00 via INTRAVENOUS
  Filled 2014-12-25: qty 4

## 2014-12-25 NOTE — Addendum Note (Signed)
Encounter addended by: Heywood Footman, RN on: 12/25/2014 11:48 AM<BR>     Documentation filed: [employer], Inpatient MAR, Orders

## 2014-12-25 NOTE — Progress Notes (Signed)
Weight and vitals stable. Hoarseness noted. Reports an occasional dry cough. Denies pain or difficulty swallowing. Reports he continues to use carafate as directed. Hyperpigmentation without desquamation of chest noted. Reports using radiaplex bid as directed. Additional tube of radiaplex given at the patient's request today.

## 2014-12-25 NOTE — Patient Instructions (Signed)
[facility] Discharge Instructions for Patients Receiving Chemotherapy  Today you received the following chemotherapy agents VP16  To help prevent nausea and vomiting after your treatment, we encourage you to take your nausea medication    If you develop nausea and vomiting that is not controlled by your nausea medication, call the clinic.   BELOW ARE SYMPTOMS THAT SHOULD BE REPORTED IMMEDIATELY:  *FEVER GREATER THAN 100.5 F  *CHILLS WITH OR WITHOUT FEVER  NAUSEA AND VOMITING THAT IS NOT CONTROLLED WITH YOUR NAUSEA MEDICATION  *UNUSUAL SHORTNESS OF BREATH  *UNUSUAL BRUISING OR BLEEDING  TENDERNESS IN MOUTH AND THROAT WITH OR WITHOUT PRESENCE OF ULCERS  *URINARY PROBLEMS  *BOWEL PROBLEMS  UNUSUAL RASH Items with * indicate a potential emergency and should be followed up as soon as possible.  Feel free to call the clinic you have any questions or concerns. The clinic phone number is [phone].  Please show the CHEMO ALERT CARD at check-in to the Emergency Department and triage nurse.   

## 2014-12-25 NOTE — Progress Notes (Signed)
  Radiation Oncology         [phone]) [phone] ________________________________  Name: Travis Medina MRN: [id number]  Date: 12/25/2014  DOB: 11-18-1933    Weekly Radiation Therapy Management    ICD-9-CM ICD-10-CM   1. Primary cancer of left upper lobe of lung 162.3 C34.12     Current Dose: 34.2 Gy     Planned Dose:  59.4 Gy  Narrative . . . . . . . . The patient presents for routine under treatment assessment.                                   Weight and vitals stable. Hoarseness noted. Reports an occasional dry cough. Denies pain or difficulty swallowing. Reports he continues to use carafate as directed. Hyperpigmentation without desquamation of chest noted. Reports using radiaplex bid as directed. Additional tube of radiaplex given at the patient's request today.  Takes carafate 4x daily in a suspension, it is helping.                                 Set-up films were reviewed.                                 The chart was checked. Physical Findings. . .  weight is 207 lb 6.4 oz (94.076 kg). His blood pressure is 141/68 and his pulse is 66. His respiration is 16 and oxygen saturation is 100%. . Weight essentially stable.  No significant changes.    Erythema on the chest and back within the radiation fields. Advised to use RadiaPlex 2x daily to help with the irritation. Impression . . . . . . . The patient is tolerating radiation. Plan . . . . . . . . . . . . Continue treatment as planned.  This document serves as a record of services personally performed by Tyler Pita, MD. It was created on his behalf by Arlyce Harman, a trained medical scribe. The creation of this record is based on the scribe's personal observations and the provider's statements to them. This document has been checked and approved by the attending provider.     ________________________________  Sheral Apley. Tammi Klippel, M.D.

## 2014-12-26 ENCOUNTER — Telehealth: Payer: Self-pay | Admitting: Internal Medicine

## 2014-12-26 ENCOUNTER — Ambulatory Visit
Admission: RE | Admit: 2014-12-26 | Discharge: 2014-12-26 | Disposition: A | Discharge status: Home / Self Care | Payer: Medicare Other | Source: Ambulatory Visit | Attending: Radiation Oncology | Admitting: Radiation Oncology

## 2014-12-26 ENCOUNTER — Other Ambulatory Visit: Payer: Self-pay | Admitting: Internal Medicine

## 2014-12-26 DIAGNOSIS — Z510 Encounter for antineoplastic radiation therapy: Secondary | ICD-10-CM | POA: Diagnosis not present

## 2014-12-26 NOTE — Telephone Encounter (Signed)
s.w. pt and advised on May and JUNE appt...pt is comming tomorrow and will get new sched

## 2014-12-27 ENCOUNTER — Ambulatory Visit (HOSPITAL_BASED_OUTPATIENT_CLINIC_OR_DEPARTMENT_OTHER): Payer: Medicare Other

## 2014-12-27 VITALS — BP 136/67 | HR 93 | Temp 97.0°F | Resp 16

## 2014-12-27 DIAGNOSIS — C3412 Malignant neoplasm of upper lobe, left bronchus or lung: Secondary | ICD-10-CM | POA: Diagnosis not present

## 2014-12-27 DIAGNOSIS — Z5189 Encounter for other specified aftercare: Secondary | ICD-10-CM | POA: Diagnosis not present

## 2014-12-27 MED ORDER — PEGFILGRASTIM INJECTION 6 MG/0.6ML ~~LOC~~
6.0000 mg | PREFILLED_SYRINGE | Freq: Once | SUBCUTANEOUS | Status: AC
  Administered 2014-12-27: 6 mg via SUBCUTANEOUS

## 2014-12-29 ENCOUNTER — Telehealth: Payer: Self-pay | Admitting: Internal Medicine

## 2014-12-29 ENCOUNTER — Ambulatory Visit
Admission: RE | Admit: 2014-12-29 | Discharge: 2014-12-29 | Disposition: A | Discharge status: Home / Self Care | Payer: Medicare Other | Source: Ambulatory Visit | Attending: Radiation Oncology | Admitting: Radiation Oncology

## 2014-12-29 DIAGNOSIS — Z510 Encounter for antineoplastic radiation therapy: Secondary | ICD-10-CM | POA: Diagnosis not present

## 2014-12-29 NOTE — Telephone Encounter (Signed)
pt came by to state that didnt understand sch-explained sch and gave pt updated sch trmts

## 2014-12-30 ENCOUNTER — Ambulatory Visit
Admission: RE | Admit: 2014-12-30 | Discharge: 2014-12-30 | Disposition: A | Discharge status: Home / Self Care | Payer: Medicare Other | Source: Ambulatory Visit | Attending: Radiation Oncology | Admitting: Radiation Oncology

## 2014-12-30 ENCOUNTER — Other Ambulatory Visit (HOSPITAL_BASED_OUTPATIENT_CLINIC_OR_DEPARTMENT_OTHER): Payer: Medicare Other

## 2014-12-30 DIAGNOSIS — Z510 Encounter for antineoplastic radiation therapy: Secondary | ICD-10-CM | POA: Diagnosis not present

## 2014-12-30 DIAGNOSIS — C3492 Malignant neoplasm of unspecified part of left bronchus or lung: Secondary | ICD-10-CM

## 2014-12-30 DIAGNOSIS — C3412 Malignant neoplasm of upper lobe, left bronchus or lung: Secondary | ICD-10-CM | POA: Diagnosis not present

## 2014-12-30 LAB — COMPREHENSIVE METABOLIC PANEL (CC13)
ALBUMIN: 3.6 g/dL (ref 3.5–5.0)
ALK PHOS: 99 U/L (ref 40–150)
ALT: 13 U/L (ref 0–55)
AST: 13 U/L (ref 5–34)
Anion Gap: 10 mEq/L (ref 3–11)
BUN: 32.1 mg/dL — ABNORMAL HIGH (ref 7.0–26.0)
CALCIUM: 9.1 mg/dL (ref 8.4–10.4)
CO2: 22 meq/L (ref 22–29)
Chloride: 104 mEq/L (ref 98–109)
Creatinine: 1.1 mg/dL (ref 0.7–1.3)
EGFR: 63 mL/min/{1.73_m2} — ABNORMAL LOW (ref >90)
GLUCOSE: 114 mg/dL (ref 70–140)
Potassium: 5 mEq/L (ref 3.5–5.1)
Sodium: 136 mEq/L (ref 136–145)
Total Bilirubin: 1.27 mg/dL — ABNORMAL HIGH (ref 0.20–1.20)
Total Protein: 6.7 g/dL (ref 6.4–8.3)

## 2014-12-30 LAB — CBC WITH DIFFERENTIAL/PLATELET
BASO%: 0.2 % (ref 0.0–2.0)
Basophils Absolute: 0 10*3/uL (ref 0.0–0.1)
EOS ABS: 0 10*3/uL (ref 0.0–0.5)
EOS%: 0.1 % (ref 0.0–7.0)
HEMATOCRIT: 30.1 % — AB (ref 38.4–49.9)
HGB: 10.1 g/dL — ABNORMAL LOW (ref 13.0–17.1)
LYMPH%: 5.4 % — AB (ref 14.0–49.0)
MCH: 30.7 pg (ref 27.2–33.4)
MCHC: 33.6 g/dL (ref 32.0–36.0)
MCV: 91.5 fL (ref 79.3–98.0)
MONO#: 0.1 10*3/uL (ref 0.1–0.9)
MONO%: 0.7 % (ref 0.0–14.0)
NEUT#: 8.3 10*3/uL — ABNORMAL HIGH (ref 1.5–6.5)
NEUT%: 93.6 % — ABNORMAL HIGH (ref 39.0–75.0)
Platelets: 271 10*3/uL (ref 140–400)
RBC: 3.29 10*6/uL — AB (ref 4.20–5.82)
RDW: 12.8 % (ref 11.0–14.6)
WBC: 8.9 10*3/uL (ref 4.0–10.3)
lymph#: 0.5 10*3/uL — ABNORMAL LOW (ref 0.9–3.3)

## 2014-12-31 ENCOUNTER — Ambulatory Visit
Admission: RE | Admit: 2014-12-31 | Discharge: 2014-12-31 | Disposition: A | Discharge status: Home / Self Care | Payer: Medicare Other | Source: Ambulatory Visit | Attending: Radiation Oncology | Admitting: Radiation Oncology

## 2014-12-31 DIAGNOSIS — Z510 Encounter for antineoplastic radiation therapy: Secondary | ICD-10-CM | POA: Diagnosis not present

## 2015-01-01 ENCOUNTER — Ambulatory Visit
Admission: RE | Admit: 2015-01-01 | Discharge: 2015-01-01 | Disposition: A | Discharge status: Home / Self Care | Payer: Medicare Other | Source: Ambulatory Visit | Attending: Radiation Oncology | Admitting: Radiation Oncology

## 2015-01-01 ENCOUNTER — Encounter: Payer: Self-pay | Admitting: Radiation Oncology

## 2015-01-01 VITALS — BP 113/66 | HR 77 | Resp 16 | Wt 195.6 lb

## 2015-01-01 DIAGNOSIS — L814 Other melanin hyperpigmentation: Secondary | ICD-10-CM | POA: Diagnosis not present

## 2015-01-01 DIAGNOSIS — L599 Disorder of the skin and subcutaneous tissue related to radiation, unspecified: Secondary | ICD-10-CM | POA: Diagnosis not present

## 2015-01-01 DIAGNOSIS — C3412 Malignant neoplasm of upper lobe, left bronchus or lung: Secondary | ICD-10-CM | POA: Diagnosis present

## 2015-01-01 DIAGNOSIS — Z510 Encounter for antineoplastic radiation therapy: Secondary | ICD-10-CM | POA: Diagnosis not present

## 2015-01-01 MED ORDER — RADIAPLEXRX EX GEL
Freq: Once | CUTANEOUS | Status: AC
  Administered 2015-01-01: 10:00:00 via TOPICAL

## 2015-01-01 NOTE — Progress Notes (Signed)
Weight and vitals stable. Hoarseness has improved. Reports cough has resolved. Reports Carafate has resolved any difficulty swallowing. Hyperpigmentation without desquamation of chest noted. Reports using radiaplex bid as directed. Additional tube of radiaplex given today upon patient's request.

## 2015-01-01 NOTE — Addendum Note (Signed)
Encounter addended by: Heywood Footman, RN on: 01/01/2015  3:37 PM<BR>     Documentation filed: [employer], Inpatient MAR, Orders

## 2015-01-01 NOTE — Addendum Note (Signed)
Encounter addended by: Heywood Footman, RN on: 01/01/2015  3:34 PM<BR>     Documentation filed: Medications

## 2015-01-01 NOTE — Progress Notes (Signed)
  Radiation Oncology         [phone]) [phone] ________________________________  Name: Darroll Bredeson MRN: [id number]  Date: 01/01/2015  DOB: June 30, 1934    Weekly Radiation Therapy Management    ICD-9-CM ICD-10-CM   1. Primary cancer of left upper lobe of lung 162.3 C34.12     Current Dose: 43.2 Gy     Planned Dose:  59.4 Gy  Narrative: Travis Medina is a 81 year old gentleman presenting for routine under treatment assessment. Weight and vitals stable. Hoarseness noted. Reports an occasional dry cough. Denies pain or difficulty swallowing. Reports he continues to use carafate as directed. Hyperpigmentation without desquamation of chest noted. Reports using radiaplex bid as directed. Additional tube of radiaplex given at the patient's request today.Set-up films were reviewed. The chart was checked.   Physical Findings:  weight is 195 lb 9.6 oz (88.724 kg). His blood pressure is 113/66 and his pulse is 77. His respiration is 16 and oxygen saturation is 99%. . Weight essentially stable.  No significant changes. Erythema on the chest and back within the radiation fields. Advised to use RadiaPlex 2x daily to help with the irritation.  Impression: The patient is tolerating radiation.  Plan: Continue treatment as planned.   This document serves as a record of services personally performed by Tyler Pita, MD. It was created on his behalf by Lenn Cal, a trained medical scribe. The creation of this record is based on the scribe's personal observations and the provider's statements to them. This document has been checked and approved by the attending provider.    ________________________________  Sheral Apley. Tammi Klippel, M.D.

## 2015-01-02 ENCOUNTER — Ambulatory Visit (HOSPITAL_BASED_OUTPATIENT_CLINIC_OR_DEPARTMENT_OTHER): Payer: Medicare Other | Admitting: Nurse Practitioner

## 2015-01-02 ENCOUNTER — Telehealth: Payer: Self-pay | Admitting: Nurse Practitioner

## 2015-01-02 ENCOUNTER — Ambulatory Visit
Admission: RE | Admit: 2015-01-02 | Discharge: 2015-01-02 | Disposition: A | Discharge status: Home / Self Care | Payer: Medicare Other | Source: Ambulatory Visit | Attending: Radiation Oncology | Admitting: Radiation Oncology

## 2015-01-02 ENCOUNTER — Encounter: Payer: Self-pay | Admitting: Nurse Practitioner

## 2015-01-02 VITALS — BP 140/61 | HR 80 | Temp 97.9°F | Resp 18 | Ht 70.0 in | Wt 195.7 lb

## 2015-01-02 DIAGNOSIS — C3412 Malignant neoplasm of upper lobe, left bronchus or lung: Secondary | ICD-10-CM | POA: Diagnosis not present

## 2015-01-02 DIAGNOSIS — D6481 Anemia due to antineoplastic chemotherapy: Secondary | ICD-10-CM

## 2015-01-02 DIAGNOSIS — T451X5A Adverse effect of antineoplastic and immunosuppressive drugs, initial encounter: Secondary | ICD-10-CM

## 2015-01-02 DIAGNOSIS — R17 Unspecified jaundice: Secondary | ICD-10-CM

## 2015-01-02 DIAGNOSIS — Z510 Encounter for antineoplastic radiation therapy: Secondary | ICD-10-CM | POA: Diagnosis not present

## 2015-01-02 NOTE — Progress Notes (Signed)
[facility] [phone]   [phone] Multidisciplinary thoracic oncology clinic  OFFICE PROGRESS NOTE  [name], MD [place] [address]  DIAGNOSIS: Limited stage (T3, N2, M0) small cell lung cancer diagnosed in February 2016 presenting with 2 pulmonary nodules in the left upper lobe as well as questionable mediastinal lymphadenopathy.  PRIOR THERAPY: None  CURRENT THERAPY: Systemic chemotherapy with carboplatin for AUC of 5 on day 1 and etoposide 120 MG/M2 on days 1, 2 and 3 with Neulasta support on day 4. First dose on 12/02/2014. This will be concurrent with radiotherapy under the care of Dr. Tammi Klippel through 01/15/15.  INTERVAL HISTORY: Isam Unrein 81 y.o. male returns to the clinic today for follow-up visit, accompanied by his wife. He is status post 2 cycles of carboplatin and etoposide and is doing well today. He denies fevers, chills, nausea, or vomiting. He has constipation 5 days after treatment, but manages this well with warm prune juice. His appetite is good and he is drinking well. He denies shortness of breath, chest pain, cough, or hemoptysis. He is somewhat fatigued from the concurrent radiation, but his skin is doing well with the radioplex gel that he applies diligently. His only pain is to the right hip which was possibly fractured before beginning treatment.   MEDICAL HISTORY: Past Medical History  Diagnosis Date  . Essential hypertension   . Lung cancer     ALLERGIES:  is allergic to morphine and related.  MEDICATIONS:  Current Outpatient Prescriptions  Medication Sig Dispense Refill  . acetaminophen (TYLENOL) 500 MG tablet Take 1 tablet (500 mg total) by mouth every 6 (six) hours as needed for mild pain. 30 tablet 0  . aspirin 325 MG tablet Take 325 mg by mouth daily.    [name] Dextromethorphan Polistirex (DELSYM PO) Take by mouth.    [name] lisinopril (PRINIVIL,ZESTRIL) 5 MG tablet Take 5 mg by mouth daily.     . non-metallic deodorant Jethro Poling) MISC Apply 1 application topically daily as needed.    . prochlorperazine (COMPAZINE) 10 MG tablet Take 1 tablet (10 mg total) by mouth every 6 (six) hours as needed for nausea or vomiting. 60 tablet 0  . sucralfate (CARAFATE) 1 G tablet Take 1 tablet (1 g total) by mouth 4 (four) times daily -  with meals and at bedtime. 5 min before food 90 tablet 5  . Wound Cleansers (RADIAPLEX EX) Apply topically.     No current facility-administered medications for this visit.    SURGICAL HISTORY:  Past Surgical History  Procedure Laterality Date  . Total knee arthroplasty      bilateral  . Lung biopsy      REVIEW OF SYSTEMS:  Constitutional: negative Eyes: negative Ears, nose, mouth, throat, and face: negative Respiratory: negative Cardiovascular: negative Gastrointestinal: negative Genitourinary:negative Integument/breast: negative Hematologic/lymphatic: negative Musculoskeletal:positive for bone pain Neurological: negative Behavioral/Psych: negative Endocrine: negative Allergic/Immunologic: negative   PHYSICAL EXAMINATION: General appearance: alert, cooperative and no distress Head: Normocephalic, without obvious abnormality, atraumatic Neck: no adenopathy, no JVD, supple, symmetrical, trachea midline and thyroid not enlarged, symmetric, no tenderness/mass/nodules Lymph nodes: Cervical, supraclavicular, and axillary nodes normal. Resp: clear to auscultation bilaterally Back: symmetric, no curvature. ROM normal. No CVA tenderness. Cardio: regular rate and rhythm, S1, S2 normal, no murmur, click, rub or gallop GI: soft, non-tender; bowel sounds normal; no masses,  no organomegaly Extremities: extremities normal, atraumatic, no cyanosis or edema Neurologic: Alert and oriented X 3, normal strength  and tone. Normal symmetric reflexes. Normal coordination and gait  ECOG PERFORMANCE STATUS: 1 - Symptomatic but completely ambulatory  Blood pressure  140/61, pulse 80, temperature 97.9 F (36.6 C), temperature source Oral, resp. rate 18, height '5\' 10"'$  (1.778 m), weight 195 lb 11.2 oz (88.769 kg), SpO2 100 %.  LABORATORY DATA: Lab Results  Component Value Date   WBC 8.9 12/30/2014   HGB 10.1* 12/30/2014   HCT 30.1* 12/30/2014   MCV 91.5 12/30/2014   PLT 271 12/30/2014      Chemistry      Component Value Date/Time   NA 136 12/30/2014 0849   NA 134* 10/25/2014 0551   K 5.0 12/30/2014 0849   K 4.9 10/25/2014 0551   CL 101 10/25/2014 0551   CO2 22 12/30/2014 0849   CO2 27 10/25/2014 0551   BUN 32.1* 12/30/2014 0849   BUN 27* 10/25/2014 0551   CREATININE 1.1 12/30/2014 0849   CREATININE 1.41* 10/25/2014 0551      Component Value Date/Time   CALCIUM 9.1 12/30/2014 0849   CALCIUM 10.0 10/25/2014 0551   ALKPHOS 99 12/30/2014 0849   ALKPHOS 80 10/23/2014 2230   AST 13 12/30/2014 0849   AST 24 10/23/2014 2230   ALT 13 12/30/2014 0849   ALT 32 10/23/2014 2230   BILITOT 1.27* 12/30/2014 0849   BILITOT 0.5 10/23/2014 2230       RADIOGRAPHIC STUDIES: No results found.  ASSESSMENT AND PLAN: This is a very pleasant 81 years old white male recently diagnosed with limited stage small cell lung cancer presented with a 2 left upper lobe pulmonary nodules as well as questionable mediastinal lymphadenopathy.  He is being treated with carboplatin for AUC of 5 on day 1 and etoposide at 120 MG/M2 on days 1, 2 and 3 with Neulasta support on day 4. This will be concurrent with radiotherapy during the first 2 cycles of his chemotherapy. He is status post 2 cycles with no major adverse events or side effects.  He is managing the constipation well on his own.  The labs were reviewed in detail and these demonstrated a spike in his bilirubin (from 0.22 to 1.27). The rest of his liver enzymes are stable and the patient is asymptomatic at this time. The patient is also demonstrating treatment related anemia with an hgb of 10.1. He denies shortness  of breath, dizziness, or excessive fatigue.  Mendy will continue with weekly labs and we will monitor these values.  He starts cycle #3 on 6/7, and will have a follow up visit on 6/14.  He understands and agrees with this plan. He has been encouraged to call with any issues that might arise before his next visit here.  Laurie Panda, NP  01/02/2015 10:59 AM

## 2015-01-02 NOTE — Telephone Encounter (Signed)
Gave avs & calendar for June. °

## 2015-01-06 ENCOUNTER — Other Ambulatory Visit: Payer: Self-pay | Admitting: Medical Oncology

## 2015-01-06 ENCOUNTER — Other Ambulatory Visit (HOSPITAL_BASED_OUTPATIENT_CLINIC_OR_DEPARTMENT_OTHER): Payer: Medicare Other

## 2015-01-06 ENCOUNTER — Ambulatory Visit
Admission: RE | Admit: 2015-01-06 | Discharge: 2015-01-06 | Disposition: A | Discharge status: Home / Self Care | Payer: Medicare Other | Source: Ambulatory Visit | Attending: Radiation Oncology | Admitting: Radiation Oncology

## 2015-01-06 DIAGNOSIS — C3412 Malignant neoplasm of upper lobe, left bronchus or lung: Secondary | ICD-10-CM | POA: Diagnosis not present

## 2015-01-06 DIAGNOSIS — Z510 Encounter for antineoplastic radiation therapy: Secondary | ICD-10-CM | POA: Diagnosis not present

## 2015-01-06 LAB — COMPREHENSIVE METABOLIC PANEL (CC13)
ALT: 10 U/L (ref 0–55)
ANION GAP: 7 meq/L (ref 3–11)
AST: 15 U/L (ref 5–34)
Albumin: 3.5 g/dL (ref 3.5–5.0)
Alkaline Phosphatase: 84 U/L (ref 40–150)
BUN: 17.9 mg/dL (ref 7.0–26.0)
CHLORIDE: 102 meq/L (ref 98–109)
CO2: 26 mEq/L (ref 22–29)
CREATININE: 1.1 mg/dL (ref 0.7–1.3)
Calcium: 8.9 mg/dL (ref 8.4–10.4)
EGFR: 65 mL/min/{1.73_m2} — AB (ref >90)
Glucose: 104 mg/dl (ref 70–140)
POTASSIUM: 4.8 meq/L (ref 3.5–5.1)
Sodium: 135 mEq/L — ABNORMAL LOW (ref 136–145)
Total Bilirubin: <0.2 mg/dL (ref 0.20–1.20)
Total Protein: 6.3 g/dL — ABNORMAL LOW (ref 6.4–8.3)

## 2015-01-06 LAB — CBC WITH DIFFERENTIAL/PLATELET
BASO%: 0.3 % (ref 0.0–2.0)
Basophils Absolute: 0 10*3/uL (ref 0.0–0.1)
EOS%: 1.3 % (ref 0.0–7.0)
Eosinophils Absolute: 0.2 10*3/uL (ref 0.0–0.5)
HCT: 25.1 % — ABNORMAL LOW (ref 38.4–49.9)
HGB: 8.4 g/dL — ABNORMAL LOW (ref 13.0–17.1)
LYMPH#: 1.4 10*3/uL (ref 0.9–3.3)
LYMPH%: 9.7 % — ABNORMAL LOW (ref 14.0–49.0)
MCH: 30.4 pg (ref 27.2–33.4)
MCHC: 33.5 g/dL (ref 32.0–36.0)
MCV: 90.9 fL (ref 79.3–98.0)
MONO#: 1.6 10*3/uL — ABNORMAL HIGH (ref 0.1–0.9)
MONO%: 11.3 % (ref 0.0–14.0)
NEUT#: 11.1 10*3/uL — ABNORMAL HIGH (ref 1.5–6.5)
NEUT%: 77.4 % — ABNORMAL HIGH (ref 39.0–75.0)
NRBC: 0 % (ref 0–0)
Platelets: 18 10*3/uL — ABNORMAL LOW (ref 140–400)
RBC: 2.76 10*6/uL — AB (ref 4.20–5.82)
RDW: 12.5 % (ref 11.0–14.6)
WBC: 14.4 10*3/uL — ABNORMAL HIGH (ref 4.0–10.3)

## 2015-01-06 NOTE — Progress Notes (Signed)
Quick Note:  Call patient with the result and advise bleeding precautions. ______

## 2015-01-07 ENCOUNTER — Telehealth: Payer: Self-pay | Admitting: Medical Oncology

## 2015-01-07 ENCOUNTER — Ambulatory Visit
Admission: RE | Admit: 2015-01-07 | Discharge: 2015-01-07 | Disposition: A | Discharge status: Home / Self Care | Payer: Medicare Other | Source: Ambulatory Visit | Attending: Radiation Oncology | Admitting: Radiation Oncology

## 2015-01-07 DIAGNOSIS — Z510 Encounter for antineoplastic radiation therapy: Secondary | ICD-10-CM | POA: Diagnosis not present

## 2015-01-07 NOTE — Telephone Encounter (Signed)
reviewed bleeding precautions with pt.

## 2015-01-07 NOTE — Telephone Encounter (Signed)
-----   Message from [name], MD sent at 01/06/2015  8:47 PM EDT ----- Call patient with the result and advise bleeding precautions.

## 2015-01-08 ENCOUNTER — Ambulatory Visit
Admission: RE | Admit: 2015-01-08 | Discharge: 2015-01-08 | Disposition: A | Discharge status: Home / Self Care | Payer: Medicare Other | Source: Ambulatory Visit | Attending: Radiation Oncology | Admitting: Radiation Oncology

## 2015-01-08 DIAGNOSIS — Z510 Encounter for antineoplastic radiation therapy: Secondary | ICD-10-CM | POA: Diagnosis not present

## 2015-01-09 ENCOUNTER — Ambulatory Visit
Admission: RE | Admit: 2015-01-09 | Discharge: 2015-01-09 | Disposition: A | Discharge status: Home / Self Care | Payer: Medicare Other | Source: Ambulatory Visit | Attending: Radiation Oncology | Admitting: Radiation Oncology

## 2015-01-09 ENCOUNTER — Encounter: Payer: Self-pay | Admitting: Radiation Oncology

## 2015-01-09 VITALS — BP 114/74 | HR 68 | Resp 16 | Wt 200.0 lb

## 2015-01-09 DIAGNOSIS — Z510 Encounter for antineoplastic radiation therapy: Secondary | ICD-10-CM | POA: Diagnosis not present

## 2015-01-09 DIAGNOSIS — C3412 Malignant neoplasm of upper lobe, left bronchus or lung: Secondary | ICD-10-CM

## 2015-01-09 MED ORDER — RADIAPLEXRX EX GEL
Freq: Once | CUTANEOUS | Status: AC
  Administered 2015-01-09: 12:00:00 via TOPICAL

## 2015-01-09 NOTE — Progress Notes (Signed)
  Radiation Oncology         [phone] ________________________________  Name: Jai Steil MRN: [id number]  Date: 01/09/2015  DOB: 08-Dec-1933    Weekly Radiation Therapy Management    ICD-9-CM ICD-10-CM   1. Primary cancer of left upper lobe of lung 162.3 C34.12     Current Dose: 52.2 Gy     Planned Dose:  59.4 Gy  Narrative: Travis Medina is a 81 year old gentleman presenting for routine under treatment assessment. Weight and vitals stable. Hoarseness improved. Denies cough. Reports Carafate has resolved any difficulty swallowing. Mild hyperpigmentation without desquamation of chest noted. Reports using radiaplex bid as directed. Reports he's doing well at this time.  Set-up films were reviewed. The chart was checked.   Physical Findings:  weight is 200 lb (90.719 kg). His blood pressure is 114/74 and his pulse is 68. His respiration is 16.  Weight essentially stable.  No significant changes.  Impression: The patient is tolerating radiation.  Plan: Continue treatment as planned.  This document serves as a record of services personally performed by Tyler Pita, MD. It was created on his behalf by Darcus Austin, a trained medical scribe. The creation of this record is based on the scribe's personal observations and the provider's statements to them. This document has been checked and approved by the attending provider.    ________________________________  Sheral Apley. Tammi Klippel, M.D.

## 2015-01-09 NOTE — Addendum Note (Signed)
Encounter addended by: Heywood Footman, RN on: 01/09/2015 11:36 AM<BR>     Documentation filed: Dx [employer], Inpatient MAR, Orders

## 2015-01-09 NOTE — Progress Notes (Signed)
Weight and vitals stable. Hoarseness improved. Denies cough. Reports Carafate has resolved any difficulty swallowing. Mild hyperpigmentation without desquamation of chest noted. Reports using radiaplex bid as directed.  BP 114/74 mmHg  Pulse 68  Resp 16  Wt 200 lb (90.719 kg) Wt Readings from Last 3 Encounters:  01/02/15 195 lb 11.2 oz (88.769 kg)  11/27/14 201 lb 11.2 oz (91.491 kg)  11/21/14 198 lb (89.812 kg)

## 2015-01-12 ENCOUNTER — Ambulatory Visit
Admission: RE | Admit: 2015-01-12 | Discharge: 2015-01-12 | Disposition: A | Discharge status: Home / Self Care | Payer: Medicare Other | Source: Ambulatory Visit | Attending: Radiation Oncology | Admitting: Radiation Oncology

## 2015-01-12 DIAGNOSIS — Z510 Encounter for antineoplastic radiation therapy: Secondary | ICD-10-CM | POA: Diagnosis not present

## 2015-01-13 ENCOUNTER — Ambulatory Visit (HOSPITAL_BASED_OUTPATIENT_CLINIC_OR_DEPARTMENT_OTHER): Payer: Medicare Other

## 2015-01-13 ENCOUNTER — Other Ambulatory Visit: Payer: Self-pay | Admitting: *Deleted

## 2015-01-13 ENCOUNTER — Other Ambulatory Visit (HOSPITAL_BASED_OUTPATIENT_CLINIC_OR_DEPARTMENT_OTHER): Payer: Medicare Other

## 2015-01-13 ENCOUNTER — Other Ambulatory Visit: Payer: Self-pay | Admitting: Internal Medicine

## 2015-01-13 ENCOUNTER — Ambulatory Visit
Admission: RE | Admit: 2015-01-13 | Discharge: 2015-01-13 | Disposition: A | Discharge status: Home / Self Care | Payer: Medicare Other | Source: Ambulatory Visit | Attending: Radiation Oncology | Admitting: Radiation Oncology

## 2015-01-13 ENCOUNTER — Ambulatory Visit (HOSPITAL_COMMUNITY)
Admission: RE | Admit: 2015-01-13 | Discharge: 2015-01-13 | Disposition: A | Discharge status: Home / Self Care | Payer: Medicare Other | Source: Ambulatory Visit | Attending: Internal Medicine | Admitting: Internal Medicine

## 2015-01-13 VITALS — BP 133/64 | HR 79 | Temp 98.1°F | Resp 18

## 2015-01-13 DIAGNOSIS — Z5111 Encounter for antineoplastic chemotherapy: Secondary | ICD-10-CM

## 2015-01-13 DIAGNOSIS — Z510 Encounter for antineoplastic radiation therapy: Secondary | ICD-10-CM | POA: Diagnosis not present

## 2015-01-13 DIAGNOSIS — D6489 Other specified anemias: Secondary | ICD-10-CM | POA: Insufficient documentation

## 2015-01-13 DIAGNOSIS — C3412 Malignant neoplasm of upper lobe, left bronchus or lung: Secondary | ICD-10-CM

## 2015-01-13 DIAGNOSIS — T451X5A Adverse effect of antineoplastic and immunosuppressive drugs, initial encounter: Secondary | ICD-10-CM

## 2015-01-13 DIAGNOSIS — C3492 Malignant neoplasm of unspecified part of left bronchus or lung: Secondary | ICD-10-CM

## 2015-01-13 DIAGNOSIS — D6481 Anemia due to antineoplastic chemotherapy: Secondary | ICD-10-CM

## 2015-01-13 LAB — COMPREHENSIVE METABOLIC PANEL (CC13)
ALK PHOS: 81 U/L (ref 40–150)
ALT: 15 U/L (ref 0–55)
ANION GAP: 8 meq/L (ref 3–11)
AST: 16 U/L (ref 5–34)
Albumin: 3.3 g/dL — ABNORMAL LOW (ref 3.5–5.0)
BILIRUBIN TOTAL: 0.25 mg/dL (ref 0.20–1.20)
BUN: 22.1 mg/dL (ref 7.0–26.0)
CALCIUM: 9 mg/dL (ref 8.4–10.4)
CO2: 23 mEq/L (ref 22–29)
Chloride: 108 mEq/L (ref 98–109)
Creatinine: 1.3 mg/dL (ref 0.7–1.3)
EGFR: 51 mL/min/{1.73_m2} — ABNORMAL LOW (ref >90)
GLUCOSE: 105 mg/dL (ref 70–140)
POTASSIUM: 4.7 meq/L (ref 3.5–5.1)
Sodium: 139 mEq/L (ref 136–145)
Total Protein: 6.5 g/dL (ref 6.4–8.3)

## 2015-01-13 LAB — CBC WITH DIFFERENTIAL/PLATELET
BASO%: 0.2 % (ref 0.0–2.0)
Basophils Absolute: 0 10*3/uL (ref 0.0–0.1)
EOS%: 0.8 % (ref 0.0–7.0)
Eosinophils Absolute: 0.1 10*3/uL (ref 0.0–0.5)
HCT: 24.3 % — ABNORMAL LOW (ref 38.4–49.9)
HGB: 8.1 g/dL — ABNORMAL LOW (ref 13.0–17.1)
LYMPH#: 1 10*3/uL (ref 0.9–3.3)
LYMPH%: 10.7 % — AB (ref 14.0–49.0)
MCH: 30.7 pg (ref 27.2–33.4)
MCHC: 33.3 g/dL (ref 32.0–36.0)
MCV: 92 fL (ref 79.3–98.0)
MONO#: 1.2 10*3/uL — ABNORMAL HIGH (ref 0.1–0.9)
MONO%: 12.3 % (ref 0.0–14.0)
NEUT#: 7.2 10*3/uL — ABNORMAL HIGH (ref 1.5–6.5)
NEUT%: 76 % — AB (ref 39.0–75.0)
Platelets: 154 10*3/uL (ref 140–400)
RBC: 2.64 10*6/uL — AB (ref 4.20–5.82)
RDW: 13.4 % (ref 11.0–14.6)
WBC: 9.5 10*3/uL (ref 4.0–10.3)

## 2015-01-13 MED ORDER — SODIUM CHLORIDE 0.9 % IV SOLN
120.0000 mg/m2 | Freq: Once | INTRAVENOUS | Status: AC
  Administered 2015-01-13: 260 mg via INTRAVENOUS
  Filled 2015-01-13: qty 13

## 2015-01-13 MED ORDER — CARBOPLATIN CHEMO INJECTION 600 MG/60ML
430.0000 mg | Freq: Once | INTRAVENOUS | Status: AC
  Administered 2015-01-13: 430 mg via INTRAVENOUS
  Filled 2015-01-13: qty 43

## 2015-01-13 MED ORDER — SODIUM CHLORIDE 0.9 % IV SOLN
Freq: Once | INTRAVENOUS | Status: AC
  Administered 2015-01-13: 11:00:00 via INTRAVENOUS
  Filled 2015-01-13: qty 8

## 2015-01-13 MED ORDER — SODIUM CHLORIDE 0.9 % IV SOLN
Freq: Once | INTRAVENOUS | Status: AC
  Administered 2015-01-13: 10:00:00 via INTRAVENOUS

## 2015-01-13 NOTE — Progress Notes (Signed)
Quick Note:  Call patient with the result and please arrange for 2 units of PRBCs this week. ______

## 2015-01-13 NOTE — Patient Instructions (Signed)
[facility] Discharge Instructions for Patients Receiving Chemotherapy  Today you received the following chemotherapy agents: Etoposide and Carboplatin   To help prevent nausea and vomiting after your treatment, we encourage you to take your nausea medication as directed.    If you develop nausea and vomiting that is not controlled by your nausea medication, call the clinic.   BELOW ARE SYMPTOMS THAT SHOULD BE REPORTED IMMEDIATELY:  *FEVER GREATER THAN 100.5 F  *CHILLS WITH OR WITHOUT FEVER  NAUSEA AND VOMITING THAT IS NOT CONTROLLED WITH YOUR NAUSEA MEDICATION  *UNUSUAL SHORTNESS OF BREATH  *UNUSUAL BRUISING OR BLEEDING  TENDERNESS IN MOUTH AND THROAT WITH OR WITHOUT PRESENCE OF ULCERS  *URINARY PROBLEMS  *BOWEL PROBLEMS  UNUSUAL RASH Items with * indicate a potential emergency and should be followed up as soon as possible.  Feel free to call the clinic you have any questions or concerns. The clinic phone number is [phone].  Please show the CHEMO ALERT CARD at check-in to the Emergency Department and triage nurse.  

## 2015-01-14 ENCOUNTER — Ambulatory Visit (HOSPITAL_COMMUNITY)
Admission: RE | Admit: 2015-01-14 | Discharge: 2015-01-14 | Disposition: A | Discharge status: Home / Self Care | Payer: Medicare Other | Source: Ambulatory Visit | Attending: Internal Medicine | Admitting: Internal Medicine

## 2015-01-14 ENCOUNTER — Ambulatory Visit
Admission: RE | Admit: 2015-01-14 | Discharge: 2015-01-14 | Disposition: A | Discharge status: Home / Self Care | Payer: Medicare Other | Source: Ambulatory Visit | Attending: Radiation Oncology | Admitting: Radiation Oncology

## 2015-01-14 ENCOUNTER — Encounter (HOSPITAL_COMMUNITY): Payer: Self-pay

## 2015-01-14 ENCOUNTER — Ambulatory Visit (HOSPITAL_BASED_OUTPATIENT_CLINIC_OR_DEPARTMENT_OTHER): Payer: Medicare Other

## 2015-01-14 ENCOUNTER — Other Ambulatory Visit: Payer: Self-pay | Admitting: Medical Oncology

## 2015-01-14 VITALS — BP 108/64 | HR 88 | Temp 98.1°F | Resp 17

## 2015-01-14 DIAGNOSIS — R918 Other nonspecific abnormal finding of lung field: Secondary | ICD-10-CM | POA: Insufficient documentation

## 2015-01-14 DIAGNOSIS — C3490 Malignant neoplasm of unspecified part of unspecified bronchus or lung: Secondary | ICD-10-CM | POA: Insufficient documentation

## 2015-01-14 DIAGNOSIS — N289 Disorder of kidney and ureter, unspecified: Secondary | ICD-10-CM | POA: Insufficient documentation

## 2015-01-14 DIAGNOSIS — C7A1 Malignant poorly differentiated neuroendocrine tumors: Secondary | ICD-10-CM | POA: Diagnosis not present

## 2015-01-14 DIAGNOSIS — D6489 Other specified anemias: Secondary | ICD-10-CM | POA: Diagnosis not present

## 2015-01-14 DIAGNOSIS — M899 Disorder of bone, unspecified: Secondary | ICD-10-CM | POA: Diagnosis not present

## 2015-01-14 DIAGNOSIS — Z5111 Encounter for antineoplastic chemotherapy: Secondary | ICD-10-CM

## 2015-01-14 DIAGNOSIS — C3412 Malignant neoplasm of upper lobe, left bronchus or lung: Secondary | ICD-10-CM

## 2015-01-14 DIAGNOSIS — K449 Diaphragmatic hernia without obstruction or gangrene: Secondary | ICD-10-CM | POA: Insufficient documentation

## 2015-01-14 DIAGNOSIS — I700 Atherosclerosis of aorta: Secondary | ICD-10-CM | POA: Diagnosis not present

## 2015-01-14 DIAGNOSIS — J439 Emphysema, unspecified: Secondary | ICD-10-CM | POA: Diagnosis not present

## 2015-01-14 DIAGNOSIS — Z510 Encounter for antineoplastic radiation therapy: Secondary | ICD-10-CM | POA: Diagnosis not present

## 2015-01-14 LAB — ABO/RH: ABO/RH(D): A POS

## 2015-01-14 MED ORDER — ETOPOSIDE CHEMO INJECTION 1 GM/50ML
120.0000 mg/m2 | Freq: Once | INTRAVENOUS | Status: AC
  Administered 2015-01-14: 260 mg via INTRAVENOUS
  Filled 2015-01-14: qty 13

## 2015-01-14 MED ORDER — SODIUM CHLORIDE 0.9 % IV SOLN
Freq: Once | INTRAVENOUS | Status: AC
  Administered 2015-01-14: 10:00:00 via INTRAVENOUS
  Filled 2015-01-14: qty 4

## 2015-01-14 MED ORDER — IOHEXOL 300 MG/ML  SOLN
100.0000 mL | Freq: Once | INTRAMUSCULAR | Status: AC | PRN
  Administered 2015-01-14: 100 mL via INTRAVENOUS

## 2015-01-14 MED ORDER — SODIUM CHLORIDE 0.9 % IV SOLN
Freq: Once | INTRAVENOUS | Status: AC
  Administered 2015-01-14: 10:00:00 via INTRAVENOUS

## 2015-01-14 NOTE — Progress Notes (Signed)
Type and cross drawn for 2 units PRBCs on 01/16/15. Pt instructed to keep blue blood bracelet for 01/16/15, voices understanding.

## 2015-01-14 NOTE — Patient Instructions (Signed)
[facility] Discharge Instructions for Patients Receiving Chemotherapy  Today you received the following chemotherapy agents: Etoposide  To help prevent nausea and vomiting after your treatment, we encourage you to take your nausea medication as prescribed by your physician.   If you develop nausea and vomiting that is not controlled by your nausea medication, call the clinic.   BELOW ARE SYMPTOMS THAT SHOULD BE REPORTED IMMEDIATELY:  *FEVER GREATER THAN 100.5 F  *CHILLS WITH OR WITHOUT FEVER  NAUSEA AND VOMITING THAT IS NOT CONTROLLED WITH YOUR NAUSEA MEDICATION  *UNUSUAL SHORTNESS OF BREATH  *UNUSUAL BRUISING OR BLEEDING  TENDERNESS IN MOUTH AND THROAT WITH OR WITHOUT PRESENCE OF ULCERS  *URINARY PROBLEMS  *BOWEL PROBLEMS  UNUSUAL RASH Items with * indicate a potential emergency and should be followed up as soon as possible.  Feel free to call the clinic you have any questions or concerns. The clinic phone number is [phone].  Please show the CHEMO ALERT CARD at check-in to the Emergency Department and triage nurse.   

## 2015-01-15 ENCOUNTER — Ambulatory Visit
Admission: RE | Admit: 2015-01-15 | Discharge: 2015-01-15 | Disposition: A | Discharge status: Home / Self Care | Payer: Medicare Other | Source: Ambulatory Visit | Attending: Radiation Oncology | Admitting: Radiation Oncology

## 2015-01-15 ENCOUNTER — Other Ambulatory Visit: Payer: Self-pay | Admitting: *Deleted

## 2015-01-15 ENCOUNTER — Encounter: Payer: Self-pay | Admitting: Radiation Oncology

## 2015-01-15 ENCOUNTER — Ambulatory Visit (HOSPITAL_BASED_OUTPATIENT_CLINIC_OR_DEPARTMENT_OTHER): Payer: Medicare Other

## 2015-01-15 VITALS — BP 148/59 | HR 66 | Temp 97.6°F | Resp 17

## 2015-01-15 DIAGNOSIS — Z5111 Encounter for antineoplastic chemotherapy: Secondary | ICD-10-CM | POA: Diagnosis not present

## 2015-01-15 DIAGNOSIS — Z510 Encounter for antineoplastic radiation therapy: Secondary | ICD-10-CM | POA: Diagnosis not present

## 2015-01-15 DIAGNOSIS — C3412 Malignant neoplasm of upper lobe, left bronchus or lung: Secondary | ICD-10-CM

## 2015-01-15 MED ORDER — SODIUM CHLORIDE 0.9 % IV SOLN
Freq: Once | INTRAVENOUS | Status: AC
  Administered 2015-01-15: 10:00:00 via INTRAVENOUS
  Filled 2015-01-15: qty 4

## 2015-01-15 MED ORDER — SODIUM CHLORIDE 0.9 % IV SOLN
Freq: Once | INTRAVENOUS | Status: AC
  Administered 2015-01-15: 10:00:00 via INTRAVENOUS

## 2015-01-15 MED ORDER — ETOPOSIDE CHEMO INJECTION 1 GM/50ML
120.0000 mg/m2 | Freq: Once | INTRAVENOUS | Status: AC
  Administered 2015-01-15: 260 mg via INTRAVENOUS
  Filled 2015-01-15: qty 13

## 2015-01-15 MED ORDER — RADIAPLEXRX EX GEL
Freq: Once | CUTANEOUS | Status: AC
  Administered 2015-01-15: 10:00:00 via TOPICAL

## 2015-01-15 NOTE — Patient Instructions (Signed)
[facility] Discharge Instructions for Patients Receiving Chemotherapy  Today you received the following chemotherapy agents: Vepesid   To help prevent nausea and vomiting after your treatment, we encourage you to take your nausea medication as directed.    If you develop nausea and vomiting that is not controlled by your nausea medication, call the clinic.   BELOW ARE SYMPTOMS THAT SHOULD BE REPORTED IMMEDIATELY:  *FEVER GREATER THAN 100.5 F  *CHILLS WITH OR WITHOUT FEVER  NAUSEA AND VOMITING THAT IS NOT CONTROLLED WITH YOUR NAUSEA MEDICATION  *UNUSUAL SHORTNESS OF BREATH  *UNUSUAL BRUISING OR BLEEDING  TENDERNESS IN MOUTH AND THROAT WITH OR WITHOUT PRESENCE OF ULCERS  *URINARY PROBLEMS  *BOWEL PROBLEMS  UNUSUAL RASH Items with * indicate a potential emergency and should be followed up as soon as possible.  Feel free to call the clinic you have any questions or concerns. The clinic phone number is [phone].  Please show the [facility] at check-in to the Emergency Department and triage nurse.

## 2015-01-15 NOTE — Progress Notes (Signed)
Patient reports no BM since 01/13/15. Pt states that prune juice usually helps with his constipation, but has not tried it at this time. Pt encouraged to drink the prune juice. Pt stated he has stool softeners at home, pt encouraged to try those along with prune juice.  Pt is to have an appointment with Dr. [name] 01/16/15. Pt informed to notify Dr. [name] if he has not had a BM by that time. Pt verbalizes understanding.

## 2015-01-15 NOTE — Addendum Note (Signed)
Encounter addended by: Norm Salt, RN on: 01/15/2015 10:14 AM<BR>     Documentation filed: Inpatient MAR

## 2015-01-15 NOTE — Progress Notes (Signed)
  Radiation Oncology         [phone] ________________________________  Name: Travis Medina MRN: [id number]  Date: 01/15/2015  DOB: [birth year]    Weekly Radiation Therapy Management    ICD-9-CM ICD-10-CM   1. Primary cancer of left upper lobe of lung 162.3 C34.12 hyaluronate sodium (RADIAPLEXRX) gel    Current Dose: 59.4 Gy     Planned Dose:  59.4 Gy  Narrative: Travis Medina is a 81 year old gentleman presenting for routine under treatment assessment. Patient has completed 33 of 33 treatments to left lung. Denies pain,shortness of breath or cough. Redness of chest greater anteriorly with no rash.Given another tube of radiaplex to continue. Twice daily.Given card to schedule one month follow up. Continue carafate. Knows to call if any concerns. Scheduled for last chemotherapy end of June. States he had ct of chest on 01/14/15 to review with Dr.Mohamed.  Set-up films were reviewed. The chart was checked.  Physical Findings:  Weight essentially stable.  No significant changes.  Impression: The patient is tolerating radiation.  Plan: Continue treatment as planned.  This document serves as a record of services personally performed by Tyler Pita, MD. It was created on his behalf by Jeralene Peters, a trained medical scribe. The creation of this record is based on the scribe's personal observations and the provider's statements to them. This document has been checked and approved by the attending provider.       ________________________________  Sheral Apley. Tammi Klippel, M.D.

## 2015-01-15 NOTE — Progress Notes (Signed)
Patient has completed 33 of 33 treatments to left lung.Denies pain,shortness of breath or cough.Redness of chest greater anteriorly with no rash.Given another tube of radiaplex to continue  Twice daily.Given card to schedule one month follow up.Continue carafate.Knows to call if any concerns.Scheduled for last chemotherapy end of June.States he had ct of chest on 01/14/15 to review with Dr.Mohamed.

## 2015-01-16 ENCOUNTER — Ambulatory Visit (HOSPITAL_BASED_OUTPATIENT_CLINIC_OR_DEPARTMENT_OTHER): Payer: Medicare Other

## 2015-01-16 ENCOUNTER — Telehealth: Payer: Self-pay | Admitting: Internal Medicine

## 2015-01-16 ENCOUNTER — Ambulatory Visit (HOSPITAL_BASED_OUTPATIENT_CLINIC_OR_DEPARTMENT_OTHER): Payer: Medicare Other | Admitting: Internal Medicine

## 2015-01-16 ENCOUNTER — Other Ambulatory Visit (HOSPITAL_BASED_OUTPATIENT_CLINIC_OR_DEPARTMENT_OTHER): Payer: Medicare Other

## 2015-01-16 ENCOUNTER — Encounter: Payer: Self-pay | Admitting: Internal Medicine

## 2015-01-16 ENCOUNTER — Encounter: Payer: Self-pay | Admitting: *Deleted

## 2015-01-16 VITALS — BP 145/74 | HR 64 | Temp 98.3°F | Resp 16

## 2015-01-16 VITALS — BP 152/63 | HR 76 | Temp 97.8°F | Resp 18 | Ht 70.0 in | Wt 204.4 lb

## 2015-01-16 DIAGNOSIS — Z5189 Encounter for other specified aftercare: Secondary | ICD-10-CM

## 2015-01-16 DIAGNOSIS — D6481 Anemia due to antineoplastic chemotherapy: Secondary | ICD-10-CM | POA: Diagnosis not present

## 2015-01-16 DIAGNOSIS — D6489 Other specified anemias: Secondary | ICD-10-CM

## 2015-01-16 DIAGNOSIS — C3412 Malignant neoplasm of upper lobe, left bronchus or lung: Secondary | ICD-10-CM

## 2015-01-16 LAB — PREPARE RBC (CROSSMATCH)

## 2015-01-16 MED ORDER — DIPHENHYDRAMINE HCL 25 MG PO CAPS
25.0000 mg | ORAL_CAPSULE | Freq: Once | ORAL | Status: AC
  Administered 2015-01-16: 25 mg via ORAL

## 2015-01-16 MED ORDER — SODIUM CHLORIDE 0.9 % IV SOLN: 250.0000 mL | Freq: Once | INTRAVENOUS | Status: DC

## 2015-01-16 MED ORDER — ACETAMINOPHEN 325 MG PO TABS
ORAL_TABLET | ORAL | Status: AC
  Filled 2015-01-16: qty 2

## 2015-01-16 MED ORDER — HEPARIN SOD (PORK) LOCK FLUSH 100 UNIT/ML IV SOLN
250.0000 [IU] | INTRAVENOUS | Status: DC | PRN
  Filled 2015-01-16: qty 5

## 2015-01-16 MED ORDER — ACETAMINOPHEN 325 MG PO TABS
650.0000 mg | ORAL_TABLET | Freq: Once | ORAL | Status: AC
  Administered 2015-01-16: 650 mg via ORAL

## 2015-01-16 MED ORDER — PROCHLORPERAZINE MALEATE 10 MG PO TABS
10.0000 mg | ORAL_TABLET | Freq: Once | ORAL | Status: AC
  Administered 2015-01-16: 10 mg via ORAL

## 2015-01-16 MED ORDER — PROCHLORPERAZINE MALEATE 10 MG PO TABS
ORAL_TABLET | ORAL | Status: AC
  Filled 2015-01-16: qty 1

## 2015-01-16 MED ORDER — HEPARIN SOD (PORK) LOCK FLUSH 100 UNIT/ML IV SOLN
500.0000 [IU] | Freq: Every day | INTRAVENOUS | Status: DC | PRN
  Filled 2015-01-16: qty 5

## 2015-01-16 MED ORDER — PEGFILGRASTIM INJECTION 6 MG/0.6ML ~~LOC~~
6.0000 mg | PREFILLED_SYRINGE | Freq: Once | SUBCUTANEOUS | Status: AC
  Administered 2015-01-16: 6 mg via SUBCUTANEOUS
  Filled 2015-01-16: qty 0.6

## 2015-01-16 NOTE — Progress Notes (Signed)
Oncology Nurse Navigator Documentation  Oncology Nurse Navigator Flowsheets 01/16/2015  Navigator Encounter Type 3 month  Patient Visit Type Follow-up  Treatment Phase Treatment.  Spoke with patient today with Dr. [name].  He had follow up scan results given by Dr. [name] today.  [name] states he is doing well. He and his wife were very pleased with results and complementary of the patients care.    Barriers/Navigation Needs No barriers at this time  Support Groups/Services Friends and Family  Time Spent with Patient 15

## 2015-01-16 NOTE — Progress Notes (Signed)
[facility] [phone]   [phone]  OFFICE PROGRESS NOTE  [name], MD [place] [address]  DIAGNOSIS: Limited stage (T3, N2, M0) small cell lung cancer diagnosed in February 2016 presenting with 2 pulmonary nodules in the left upper lobe as well as questionable mediastinal lymphadenopathy.  PRIOR THERAPY: None  CURRENT THERAPY: Systemic chemotherapy with carboplatin for AUC of 5 on day 1 and etoposide 120 MG/M2 on days 1, 2 and 3 with Neulasta support on day 4. First dose on 12/02/2014. He is status post 3 cycles. This is concurrent with radiotherapy under the care of Dr. Tammi Klippel completed on 01/15/2015.  INTERVAL HISTORY: Travis Medina 81 y.o. male returns to the clinic today for follow-up visit accompanied by his wife. The patient is feeling fine today with no specific complaints. He is tolerating his current systemic chemotherapy with carboplatin and etoposide fairly well with no significant adverse effects. He completed a course of concurrent radiotherapy under the care of Dr. Tammi Klippel yesterday. He denied having any significant chest pain, shortness of breath, cough or hemoptysis. The patient denied having any significant nausea or vomiting, no fever or chills. He has no significant weight loss or night sweats. He had repeat CT scan of the chest, abdomen and pelvis performed 2 days ago and he is here for evaluation and discussion of his scan results.  MEDICAL HISTORY: Past Medical History  Diagnosis Date  . Essential hypertension   . Lung cancer     ALLERGIES:  is allergic to morphine and related.  MEDICATIONS:  Current Outpatient Prescriptions  Medication Sig Dispense Refill  . Dextromethorphan Polistirex (DELSYM PO) Take by mouth.    [name] lisinopril (PRINIVIL,ZESTRIL) 5 MG tablet Take 5 mg by mouth daily.    . sucralfate (CARAFATE) 1 G tablet Take 1 tablet (1 g total) by mouth 4 (four) times daily -  with meals and  at bedtime. 5 min before food 90 tablet 5  . Wound Cleansers (RADIAPLEX EX) Apply topically.    [name] acetaminophen (TYLENOL) 500 MG tablet Take 1 tablet (500 mg total) by mouth every 6 (six) hours as needed for mild pain. (Patient not taking: Reported on 01/16/2015) 30 tablet 0  . aspirin 325 MG tablet Take 325 mg by mouth daily.    . prochlorperazine (COMPAZINE) 10 MG tablet Take 1 tablet (10 mg total) by mouth every 6 (six) hours as needed for nausea or vomiting. (Patient not taking: Reported on 01/16/2015) 60 tablet 0   No current facility-administered medications for this visit.    SURGICAL HISTORY:  Past Surgical History  Procedure Laterality Date  . Total knee arthroplasty      bilateral  . Lung biopsy      REVIEW OF SYSTEMS:  Constitutional: negative Eyes: negative Ears, nose, mouth, throat, and face: negative Respiratory: negative Cardiovascular: negative Gastrointestinal: negative Genitourinary:negative Integument/breast: negative Hematologic/lymphatic: negative Musculoskeletal:positive for bone pain Neurological: negative Behavioral/Psych: negative Endocrine: negative Allergic/Immunologic: negative   PHYSICAL EXAMINATION: General appearance: alert, cooperative and no distress Head: Normocephalic, without obvious abnormality, atraumatic Neck: no adenopathy, no JVD, supple, symmetrical, trachea midline and thyroid not enlarged, symmetric, no tenderness/mass/nodules Lymph nodes: Cervical, supraclavicular, and axillary nodes normal. Resp: clear to auscultation bilaterally Back: symmetric, no curvature. ROM normal. No CVA tenderness. Cardio: regular rate and rhythm, S1, S2 normal, no murmur, click, rub or gallop GI: soft, non-tender; bowel sounds normal; no masses,  no organomegaly Extremities: extremities normal, atraumatic, no cyanosis or  edema Neurologic: Alert and oriented X 3, normal strength and tone. Normal symmetric reflexes. Normal coordination and gait  ECOG  PERFORMANCE STATUS: 1 - Symptomatic but completely ambulatory  Blood pressure 152/63, pulse 76, temperature 97.8 F (36.6 C), temperature source Oral, resp. rate 18, height '5\' 10"'$  (1.778 m), weight 204 lb 6.4 oz (92.715 kg), SpO2 100 %.  LABORATORY DATA: Lab Results  Component Value Date   WBC 9.5 01/13/2015   HGB 8.1* 01/13/2015   HCT 24.3* 01/13/2015   MCV 92.0 01/13/2015   PLT 154 01/13/2015      Chemistry      Component Value Date/Time   NA 139 01/13/2015 0908   NA 134* 10/25/2014 0551   K 4.7 01/13/2015 0908   K 4.9 10/25/2014 0551   CL 101 10/25/2014 0551   CO2 23 01/13/2015 0908   CO2 27 10/25/2014 0551   BUN 22.1 01/13/2015 0908   BUN 27* 10/25/2014 0551   CREATININE 1.3 01/13/2015 0908   CREATININE 1.41* 10/25/2014 0551      Component Value Date/Time   CALCIUM 9.0 01/13/2015 0908   CALCIUM 10.0 10/25/2014 0551   ALKPHOS 81 01/13/2015 0908   ALKPHOS 80 10/23/2014 2230   AST 16 01/13/2015 0908   AST 24 10/23/2014 2230   ALT 15 01/13/2015 0908   ALT 32 10/23/2014 2230   BILITOT 0.25 01/13/2015 0908   BILITOT 0.5 10/23/2014 2230       RADIOGRAPHIC STUDIES: Ct Chest W Contrast  01/14/2015   CLINICAL DATA:  Subsequent encounter for lung cancer.  EXAM: CT CHEST, ABDOMEN, AND PELVIS WITH CONTRAST  TECHNIQUE: Multidetector CT imaging of the chest, abdomen and pelvis was performed following the standard protocol during bolus administration of intravenous contrast.  CONTRAST:  141m OMNIPAQUE IOHEXOL 300 MG/ML  SOLN  COMPARISON:  PET-CT from 11/06/2014. Chest abdomen and pelvis CT from 10/24/2014.  FINDINGS: CT CHEST FINDINGS  Mediastinum/Nodes: There is no axillary lymphadenopathy. No mediastinal or hilar lymphadenopathy. The nonenlarged AP window and right paratracheal lymph node seen on the recent PET-CT are stable. Thoracic esophagus is unremarkable. Thyroid gland has normal CT imaging features. Heart size is normal. Coronary artery calcification is noted. No  pericardial effusion.  Lungs/Pleura: Anterior left upper lobe pulmonary nodule seen to be hypermetabolic on recent PET-CT has a more elongated but narrow configuration on today's study, measuring 18 x 6 mm today compared to 13 x 9 mm previously.  A second left upper lobe hypermetabolic pulmonary nodules seen previously is seen more posteriorly in the left upper lobe (image 21 series 4. This has decreased substantially in size in the interval, now measuring 5 mm compared to 13 mm previously.  There are numerous other very tiny scattered bilateral pulmonary nodules, most of which are subpleural. Changes of emphysema noted. No focal airspace consolidation. No pulmonary edema. No evidence for pleural effusion.  Musculoskeletal: Bone windows reveal no worrisome lytic or sclerotic osseous lesions.  CT ABDOMEN AND PELVIS FINDINGS  Hepatobiliary: No focal abnormality within the liver parenchyma. There is no evidence for gallstones, gallbladder wall thickening, or pericholecystic fluid. No intrahepatic or extrahepatic biliary dilation.  Pancreas: No focal mass lesion. No dilatation of the main duct. No intraparenchymal cyst. No peripancreatic edema.  Spleen: No splenomegaly. No focal mass lesion.  Adrenals/Urinary Tract: No adrenal nodule or mass. 9 mm well-defined low-density lesion in the interpolar left kidney is compatible with a cyst. No enhancing lesion or hydronephrosis in either kidney.  Stomach/Bowel: Tiny hiatal hernia noted. Stomach  otherwise unremarkable. Duodenum is normally positioned as is the ligament of Treitz. No small bowel wall thickening. No small bowel dilatation. Terminal ileum normal. The appendix is not visualized, but there is no edema or inflammation in the region of the cecum. No gross colonic mass. No colonic wall thickening. No substantial diverticular change.  Vascular/Lymphatic: There is abdominal aortic atherosclerosis without aneurysm. No lymphadenopathy in the abdomen or pelvis.   Reproductive: Mild prostatomegaly and seminal vesicles are unremarkable.  Other: No intraperitoneal free fluid.  Musculoskeletal: Sclerosis in the right femoral neck is stable. No worrisome focal lytic or sclerotic osseous abnormality. Stable compression deformity at T12 and L1.  IMPRESSION: 1. One of the 2 hypermetabolic left upper lobe pulmonary nodules has almost nearly resolved in the interval. The other has undergone some evolution in shape, now appearing more elongated than round and now measuring 18 x 6 mm compared to 13 x 9 mm previously. 2. No other new or progressive findings in the chest. 3. Emphysema. 4. Small stable probable cyst in the left kidney.   Electronically Signed   By: Misty Stanley M.D.   On: 01/14/2015 08:47   Ct Abdomen Pelvis W Contrast  01/14/2015   CLINICAL DATA:  Subsequent encounter for lung cancer.  EXAM: CT CHEST, ABDOMEN, AND PELVIS WITH CONTRAST  TECHNIQUE: Multidetector CT imaging of the chest, abdomen and pelvis was performed following the standard protocol during bolus administration of intravenous contrast.  CONTRAST:  130m OMNIPAQUE IOHEXOL 300 MG/ML  SOLN  COMPARISON:  PET-CT from 11/06/2014. Chest abdomen and pelvis CT from 10/24/2014.  FINDINGS: CT CHEST FINDINGS  Mediastinum/Nodes: There is no axillary lymphadenopathy. No mediastinal or hilar lymphadenopathy. The nonenlarged AP window and right paratracheal lymph node seen on the recent PET-CT are stable. Thoracic esophagus is unremarkable. Thyroid gland has normal CT imaging features. Heart size is normal. Coronary artery calcification is noted. No pericardial effusion.  Lungs/Pleura: Anterior left upper lobe pulmonary nodule seen to be hypermetabolic on recent PET-CT has a more elongated but narrow configuration on today's study, measuring 18 x 6 mm today compared to 13 x 9 mm previously.  A second left upper lobe hypermetabolic pulmonary nodules seen previously is seen more posteriorly in the left upper lobe (image  21 series 4. This has decreased substantially in size in the interval, now measuring 5 mm compared to 13 mm previously.  There are numerous other very tiny scattered bilateral pulmonary nodules, most of which are subpleural. Changes of emphysema noted. No focal airspace consolidation. No pulmonary edema. No evidence for pleural effusion.  Musculoskeletal: Bone windows reveal no worrisome lytic or sclerotic osseous lesions.  CT ABDOMEN AND PELVIS FINDINGS  Hepatobiliary: No focal abnormality within the liver parenchyma. There is no evidence for gallstones, gallbladder wall thickening, or pericholecystic fluid. No intrahepatic or extrahepatic biliary dilation.  Pancreas: No focal mass lesion. No dilatation of the main duct. No intraparenchymal cyst. No peripancreatic edema.  Spleen: No splenomegaly. No focal mass lesion.  Adrenals/Urinary Tract: No adrenal nodule or mass. 9 mm well-defined low-density lesion in the interpolar left kidney is compatible with a cyst. No enhancing lesion or hydronephrosis in either kidney.  Stomach/Bowel: Tiny hiatal hernia noted. Stomach otherwise unremarkable. Duodenum is normally positioned as is the ligament of Treitz. No small bowel wall thickening. No small bowel dilatation. Terminal ileum normal. The appendix is not visualized, but there is no edema or inflammation in the region of the cecum. No gross colonic mass. No colonic wall thickening. No substantial  diverticular change.  Vascular/Lymphatic: There is abdominal aortic atherosclerosis without aneurysm. No lymphadenopathy in the abdomen or pelvis.  Reproductive: Mild prostatomegaly and seminal vesicles are unremarkable.  Other: No intraperitoneal free fluid.  Musculoskeletal: Sclerosis in the right femoral neck is stable. No worrisome focal lytic or sclerotic osseous abnormality. Stable compression deformity at T12 and L1.  IMPRESSION: 1. One of the 2 hypermetabolic left upper lobe pulmonary nodules has almost nearly resolved  in the interval. The other has undergone some evolution in shape, now appearing more elongated than round and now measuring 18 x 6 mm compared to 13 x 9 mm previously. 2. No other new or progressive findings in the chest. 3. Emphysema. 4. Small stable probable cyst in the left kidney.   Electronically Signed   By: Misty Stanley M.D.   On: 01/14/2015 08:47    ASSESSMENT AND PLAN: This is a very pleasant 81 years old white male recently diagnosed with limited stage small cell lung cancer presented with a 2 left upper lobe pulmonary nodules as well as questionable mediastinal lymphadenopathy.  He is currently undergoing systemic chemotherapy with carboplatin and etoposide status post 3 cycles concurrent with radiation which was completed yesterday. The patient is tolerating his treatment fairly well with no significant adverse effects. The recent CT scan of the chest showed 1 of the 2 hypermetabolic left upper lobe pulmonary nodules has almost nearly resolved and the other nodule has a change in shape and looks slightly larger. I discussed the scan results with the patient and his wife. I recommended for him to proceed with cycle #4 less than 3 weeks as scheduled. He would have repeat imaging studies 4-6 weeks after completion of the last cycle of his treatment. The patient would come back for follow-up visit in 3 weeks with the start of cycle #4. He was advised to call immediately if he has any concerning symptoms in the interval. The patient voices understanding of current disease status and treatment options and is in agreement with the current care plan.  All questions were answered. The patient knows to call the clinic with any problems, questions or concerns. We can certainly see the patient much sooner if necessary.  Disclaimer: This note was dictated with voice recognition software. Similar sounding words can inadvertently be transcribed and may not be corrected upon review.

## 2015-01-16 NOTE — Telephone Encounter (Signed)
Gave and printed appt sched and avs for pt for jUNE

## 2015-01-17 ENCOUNTER — Ambulatory Visit: Payer: Medicare Other

## 2015-01-18 NOTE — Progress Notes (Signed)
  Radiation Oncology         [phone] ________________________________  Name: Travis Medina MRN: [id number]  Date: 01/15/2015  DOB: 03-Jun-1934  End of Treatment Note  ICD-9-CM ICD-10-CM    1. Primary cancer of left upper lobe of lung 162.3 C34.12    DIAGNOSIS: 81 year old man with Stage III multifocal left upper lung small cell cancer.     Indication for treatment:  Curative       Radiation treatment dates:   12/01/2014-01/15/2015  Site/dose:   The parenchymal lung masses in the left upper lobe and the adenopathy were treated to 59.4 Gy in 33 fractions of 1.8 Gy  Beams/energy:   Five fields were used to deliver 6 and 10 MV X-rays conformally to the target using CT IGRT.  Narrative: The patient tolerated radiation treatment relatively well.   Denies pain,shortness of breath or cough. Redness of chest greater anteriorly with no rash.  Plan: The patient has completed radiation treatment. The patient will return to radiation oncology clinic for routine followup in one month. I advised him to call or return sooner if he has any questions or concerns related to his recovery or treatment. ________________________________  Sheral Apley. Tammi Klippel, M.D.

## 2015-01-19 LAB — TYPE AND SCREEN
ABO/RH(D): A POS
Antibody Screen: NEGATIVE
Unit division: 0
Unit division: 0

## 2015-01-20 ENCOUNTER — Other Ambulatory Visit (HOSPITAL_BASED_OUTPATIENT_CLINIC_OR_DEPARTMENT_OTHER): Payer: Medicare Other

## 2015-01-20 ENCOUNTER — Ambulatory Visit: Payer: Medicare Other | Admitting: Physician Assistant

## 2015-01-20 DIAGNOSIS — C3412 Malignant neoplasm of upper lobe, left bronchus or lung: Secondary | ICD-10-CM

## 2015-01-20 DIAGNOSIS — C3492 Malignant neoplasm of unspecified part of left bronchus or lung: Secondary | ICD-10-CM

## 2015-01-20 LAB — CBC WITH DIFFERENTIAL/PLATELET
BASO%: 0.8 % (ref 0.0–2.0)
Basophils Absolute: 0 10*3/uL (ref 0.0–0.1)
EOS%: 0.4 % (ref 0.0–7.0)
Eosinophils Absolute: 0 10*3/uL (ref 0.0–0.5)
HEMATOCRIT: 28 % — AB (ref 38.4–49.9)
HGB: 9.4 g/dL — ABNORMAL LOW (ref 13.0–17.1)
LYMPH%: 18.8 % (ref 14.0–49.0)
MCH: 30.7 pg (ref 27.2–33.4)
MCHC: 33.6 g/dL (ref 32.0–36.0)
MCV: 91.5 fL (ref 79.3–98.0)
MONO#: 0.1 10*3/uL (ref 0.1–0.9)
MONO%: 3.3 % (ref 0.0–14.0)
NEUT#: 1.9 10*3/uL (ref 1.5–6.5)
NEUT%: 76.7 % — AB (ref 39.0–75.0)
PLATELETS: 106 10*3/uL — AB (ref 140–400)
RBC: 3.06 10*6/uL — ABNORMAL LOW (ref 4.20–5.82)
RDW: 14.1 % (ref 11.0–14.6)
WBC: 2.5 10*3/uL — ABNORMAL LOW (ref 4.0–10.3)
lymph#: 0.5 10*3/uL — ABNORMAL LOW (ref 0.9–3.3)

## 2015-01-20 LAB — COMPREHENSIVE METABOLIC PANEL (CC13)
ALK PHOS: 92 U/L (ref 40–150)
ALT: 11 U/L (ref 0–55)
AST: 10 U/L (ref 5–34)
Albumin: 3.4 g/dL — ABNORMAL LOW (ref 3.5–5.0)
Anion Gap: 10 mEq/L (ref 3–11)
BILIRUBIN TOTAL: 1.13 mg/dL (ref 0.20–1.20)
BUN: 28.5 mg/dL — ABNORMAL HIGH (ref 7.0–26.0)
CO2: 21 mEq/L — ABNORMAL LOW (ref 22–29)
Calcium: 8.8 mg/dL (ref 8.4–10.4)
Chloride: 104 mEq/L (ref 98–109)
Creatinine: 1.1 mg/dL (ref 0.7–1.3)
EGFR: 61 mL/min/{1.73_m2} — ABNORMAL LOW (ref >90)
Glucose: 141 mg/dl — ABNORMAL HIGH (ref 70–140)
Potassium: 4.8 mEq/L (ref 3.5–5.1)
SODIUM: 135 meq/L — AB (ref 136–145)
Total Protein: 6.2 g/dL — ABNORMAL LOW (ref 6.4–8.3)

## 2015-01-27 ENCOUNTER — Other Ambulatory Visit (HOSPITAL_BASED_OUTPATIENT_CLINIC_OR_DEPARTMENT_OTHER): Payer: Medicare Other

## 2015-01-27 ENCOUNTER — Telehealth: Payer: Self-pay | Admitting: *Deleted

## 2015-01-27 DIAGNOSIS — C3412 Malignant neoplasm of upper lobe, left bronchus or lung: Secondary | ICD-10-CM | POA: Diagnosis not present

## 2015-01-27 DIAGNOSIS — C3492 Malignant neoplasm of unspecified part of left bronchus or lung: Secondary | ICD-10-CM

## 2015-01-27 LAB — CBC WITH DIFFERENTIAL/PLATELET
BASO%: 0.1 % (ref 0.0–2.0)
Basophils Absolute: 0 10*3/uL (ref 0.0–0.1)
EOS%: 1.2 % (ref 0.0–7.0)
Eosinophils Absolute: 0.1 10*3/uL (ref 0.0–0.5)
HCT: 25 % — ABNORMAL LOW (ref 38.4–49.9)
HGB: 8.3 g/dL — ABNORMAL LOW (ref 13.0–17.1)
LYMPH%: 11.6 % — ABNORMAL LOW (ref 14.0–49.0)
MCH: 30.4 pg (ref 27.2–33.4)
MCHC: 33.2 g/dL (ref 32.0–36.0)
MCV: 91.6 fL (ref 79.3–98.0)
MONO#: 1.2 10*3/uL — ABNORMAL HIGH (ref 0.1–0.9)
MONO%: 15.2 % — ABNORMAL HIGH (ref 0.0–14.0)
NEUT#: 5.5 10*3/uL (ref 1.5–6.5)
NEUT%: 71.9 % (ref 39.0–75.0)
Platelets: 13 10*3/uL — ABNORMAL LOW (ref 140–400)
RBC: 2.73 10*6/uL — ABNORMAL LOW (ref 4.20–5.82)
RDW: 13.9 % (ref 11.0–14.6)
WBC: 7.7 10*3/uL (ref 4.0–10.3)
lymph#: 0.9 10*3/uL (ref 0.9–3.3)
nRBC: 0 % (ref 0–0)

## 2015-01-27 LAB — COMPREHENSIVE METABOLIC PANEL (CC13)
ALBUMIN: 3.5 g/dL (ref 3.5–5.0)
ALK PHOS: 86 U/L (ref 40–150)
ALT: 11 U/L (ref 0–55)
ANION GAP: 7 meq/L (ref 3–11)
AST: 11 U/L (ref 5–34)
BUN: 13.1 mg/dL (ref 7.0–26.0)
CHLORIDE: 109 meq/L (ref 98–109)
CO2: 26 meq/L (ref 22–29)
Calcium: 9.3 mg/dL (ref 8.4–10.4)
Creatinine: 1.3 mg/dL (ref 0.7–1.3)
EGFR: 54 mL/min/{1.73_m2} — ABNORMAL LOW (ref >90)
GLUCOSE: 105 mg/dL (ref 70–140)
Potassium: 4.9 mEq/L (ref 3.5–5.1)
SODIUM: 142 meq/L (ref 136–145)
TOTAL PROTEIN: 6.3 g/dL — AB (ref 6.4–8.3)
Total Bilirubin: 0.22 mg/dL (ref 0.20–1.20)

## 2015-01-27 NOTE — Progress Notes (Signed)
Quick Note:  Call patient with the result and provide Bleeding precautions ______

## 2015-01-27 NOTE — Telephone Encounter (Signed)
-----   Message from [name], MD sent at 01/27/2015  2:25 PM EDT ----- Call patient with the result and provide Bleeding precautions

## 2015-01-27 NOTE — Telephone Encounter (Signed)
Notified pt of lab results, discussed bleeding precautions.  Pt confirmed upcoming appt 6/28.  No further concerns at this time.

## 2015-02-03 ENCOUNTER — Other Ambulatory Visit (HOSPITAL_BASED_OUTPATIENT_CLINIC_OR_DEPARTMENT_OTHER): Payer: Medicare Other

## 2015-02-03 ENCOUNTER — Ambulatory Visit (HOSPITAL_BASED_OUTPATIENT_CLINIC_OR_DEPARTMENT_OTHER): Payer: Medicare Other | Admitting: Physician Assistant

## 2015-02-03 ENCOUNTER — Ambulatory Visit (HOSPITAL_BASED_OUTPATIENT_CLINIC_OR_DEPARTMENT_OTHER): Payer: Medicare Other

## 2015-02-03 ENCOUNTER — Encounter: Payer: Self-pay | Admitting: Physician Assistant

## 2015-02-03 ENCOUNTER — Telehealth: Payer: Self-pay | Admitting: *Deleted

## 2015-02-03 ENCOUNTER — Telehealth: Payer: Self-pay | Admitting: Physician Assistant

## 2015-02-03 VITALS — BP 134/59 | HR 104 | Temp 98.1°F | Resp 20 | Ht 70.0 in | Wt 200.5 lb

## 2015-02-03 DIAGNOSIS — Z5111 Encounter for antineoplastic chemotherapy: Secondary | ICD-10-CM

## 2015-02-03 DIAGNOSIS — C3412 Malignant neoplasm of upper lobe, left bronchus or lung: Secondary | ICD-10-CM

## 2015-02-03 DIAGNOSIS — C3492 Malignant neoplasm of unspecified part of left bronchus or lung: Secondary | ICD-10-CM

## 2015-02-03 DIAGNOSIS — D6481 Anemia due to antineoplastic chemotherapy: Secondary | ICD-10-CM

## 2015-02-03 DIAGNOSIS — R5383 Other fatigue: Secondary | ICD-10-CM | POA: Diagnosis not present

## 2015-02-03 DIAGNOSIS — T451X5A Adverse effect of antineoplastic and immunosuppressive drugs, initial encounter: Secondary | ICD-10-CM

## 2015-02-03 DIAGNOSIS — D6489 Other specified anemias: Secondary | ICD-10-CM | POA: Diagnosis not present

## 2015-02-03 LAB — COMPREHENSIVE METABOLIC PANEL (CC13)
ALBUMIN: 3.4 g/dL — AB (ref 3.5–5.0)
ALT: 12 U/L (ref 0–55)
ANION GAP: 8 meq/L (ref 3–11)
AST: 15 U/L (ref 5–34)
Alkaline Phosphatase: 80 U/L (ref 40–150)
BILIRUBIN TOTAL: 0.3 mg/dL (ref 0.20–1.20)
BUN: 19.9 mg/dL (ref 7.0–26.0)
CO2: 24 mEq/L (ref 22–29)
Calcium: 8.8 mg/dL (ref 8.4–10.4)
Chloride: 105 mEq/L (ref 98–109)
Creatinine: 1.2 mg/dL (ref 0.7–1.3)
EGFR: 56 mL/min/{1.73_m2} — ABNORMAL LOW (ref >90)
GLUCOSE: 161 mg/dL — AB (ref 70–140)
Potassium: 4.3 mEq/L (ref 3.5–5.1)
Sodium: 137 mEq/L (ref 136–145)
TOTAL PROTEIN: 6.3 g/dL — AB (ref 6.4–8.3)

## 2015-02-03 LAB — CBC WITH DIFFERENTIAL/PLATELET
BASO%: 0.3 % (ref 0.0–2.0)
Basophils Absolute: 0 10*3/uL (ref 0.0–0.1)
EOS ABS: 0 10*3/uL (ref 0.0–0.5)
EOS%: 0.4 % (ref 0.0–7.0)
HEMATOCRIT: 24.1 % — AB (ref 38.4–49.9)
HGB: 8.1 g/dL — ABNORMAL LOW (ref 13.0–17.1)
LYMPH%: 16.8 % (ref 14.0–49.0)
MCH: 31.3 pg (ref 27.2–33.4)
MCHC: 33.6 g/dL (ref 32.0–36.0)
MCV: 93.1 fL (ref 79.3–98.0)
MONO#: 0.7 10*3/uL (ref 0.1–0.9)
MONO%: 8.9 % (ref 0.0–14.0)
NEUT%: 73.6 % (ref 39.0–75.0)
NEUTROS ABS: 5.7 10*3/uL (ref 1.5–6.5)
PLATELETS: 92 10*3/uL — AB (ref 140–400)
RBC: 2.59 10*6/uL — ABNORMAL LOW (ref 4.20–5.82)
RDW: 14.9 % — ABNORMAL HIGH (ref 11.0–14.6)
WBC: 7.7 10*3/uL (ref 4.0–10.3)
lymph#: 1.3 10*3/uL (ref 0.9–3.3)

## 2015-02-03 MED ORDER — SODIUM CHLORIDE 0.9 % IV SOLN
Freq: Once | INTRAVENOUS | Status: AC
  Administered 2015-02-03: 11:00:00 via INTRAVENOUS

## 2015-02-03 MED ORDER — SODIUM CHLORIDE 0.9 % IV SOLN
453.5000 mg | Freq: Once | INTRAVENOUS | Status: AC
  Administered 2015-02-03: 450 mg via INTRAVENOUS
  Filled 2015-02-03: qty 45

## 2015-02-03 MED ORDER — ETOPOSIDE CHEMO INJECTION 1 GM/50ML
120.0000 mg/m2 | Freq: Once | INTRAVENOUS | Status: AC
  Administered 2015-02-03: 260 mg via INTRAVENOUS
  Filled 2015-02-03: qty 13

## 2015-02-03 MED ORDER — SODIUM CHLORIDE 0.9 % IV SOLN
Freq: Once | INTRAVENOUS | Status: AC
  Administered 2015-02-03: 11:00:00 via INTRAVENOUS
  Filled 2015-02-03: qty 8

## 2015-02-03 NOTE — Telephone Encounter (Signed)
Per staff message and POF I have scheduled appts. Advised scheduler of appts. JMW  

## 2015-02-03 NOTE — Patient Instructions (Signed)
We will arrange to transfuse you a total of 2 units of blood to address your chemotherapy induced anemia Continue weekly labs as scheduled Follow-up in 3 weeks with a restaging CT scan of your chest to reevaluate your disease, prior to your next scheduled cycle of chemotherapy

## 2015-02-03 NOTE — Progress Notes (Addendum)
[place] [phone]   [phone]  OFFICE PROGRESS NOTE  Travis Reel, MD Travis Medina [address]  DIAGNOSIS: Limited stage (T3, N2, M0) small cell lung cancer diagnosed in February 2016 presenting with 2 pulmonary nodules in the left upper lobe as well as questionable mediastinal lymphadenopathy.  PRIOR THERAPY: concurrent with radiotherapy under the care of Dr. Tammi Klippel completed 01/15/2015  CURRENT THERAPY: Systemic chemotherapy with carboplatin for AUC of 5 on day 1 and etoposide 120 MG/M2 on days 1, 2 and 3 with Neulasta support on day 4. First dose on 12/02/2014. He is status post 3 cycles.   INTERVAL HISTORY: Travis Medina 81 y.o. male returns to the clinic today for follow-up visit accompanied by his wife. The patient is feeling fine today with no specific complaints except for fatigue and some weakness particular felt in his legs. He notes some increase shortness of breath recently.Travis Medina He is tolerating his current systemic chemotherapy with carboplatin and etoposide fairly well with no significant adverse effects. He completed a course of concurrent radiotherapy under the care of Dr. Tammi Klippel on 01/15/2015. He denied having any significant chest pain,  cough or hemoptysis. The patient denied having any significant nausea or vomiting, no fever or chills. He has no significant weight loss or night sweats. He presents to proceed with cycle #4 of his systemic chemotherapy with carboplatin and etoposide with Neulasta support.  MEDICAL HISTORY: Past Medical History  Diagnosis Date  . Essential hypertension   . Lung cancer     ALLERGIES:  is allergic to morphine and related.  MEDICATIONS:  Current Outpatient Prescriptions  Medication Sig Dispense Refill  . acetaminophen (TYLENOL) 500 MG tablet Take 1 tablet (500 mg total) by mouth every 6 (six) hours as needed for mild pain. 30 tablet 0  . aspirin 325 MG tablet Take 325 mg by  mouth daily.    Travis Medina Dextromethorphan Polistirex (DELSYM PO) Take by mouth.    Travis Medina lisinopril (PRINIVIL,ZESTRIL) 5 MG tablet Take 5 mg by mouth daily.    . prochlorperazine (COMPAZINE) 10 MG tablet Take 1 tablet (10 mg total) by mouth every 6 (six) hours as needed for nausea or vomiting. 60 tablet 0  . sucralfate (CARAFATE) 1 G tablet Take 1 tablet (1 g total) by mouth 4 (four) times daily -  with meals and at bedtime. 5 min before food 90 tablet 5  . Wound Cleansers (RADIAPLEX EX) Apply topically.     No current facility-administered medications for this visit.   Facility-Administered Medications Ordered in Other Visits  Medication Dose Route Frequency Provider Last Rate Last Dose  . etoposide (VEPESID) 260 mg in sodium chloride 0.9 % 650 mL chemo infusion  120 mg/m2 (Treatment Plan Actual) Intravenous Once [name], MD 663 mL/hr at 02/03/15 1233 260 mg at 02/03/15 1233    SURGICAL HISTORY:  Past Surgical History  Procedure Laterality Date  . Total knee arthroplasty      bilateral  . Lung biopsy      REVIEW OF SYSTEMS:  Constitutional: positive for fatigue Eyes: negative Ears, nose, mouth, throat, and face: negative Respiratory: positive for dyspnea on exertion Cardiovascular: negative Gastrointestinal: negative Genitourinary:negative Integument/breast: negative Hematologic/lymphatic: negative Musculoskeletal:positive for bone pain Neurological: positive for weakness Behavioral/Psych: negative Endocrine: negative Allergic/Immunologic: negative   PHYSICAL EXAMINATION: General appearance: alert, cooperative and no distress Head: Normocephalic, without obvious abnormality, atraumatic Neck: no adenopathy, no JVD, supple, symmetrical, trachea midline and thyroid not enlarged, symmetric,  no tenderness/mass/nodules Lymph nodes: Cervical, supraclavicular, and axillary nodes normal. Resp: clear to auscultation bilaterally Back: symmetric, no curvature. ROM normal. No CVA  tenderness. Cardio: regular rate and rhythm, S1, S2 normal, no murmur, click, rub or gallop GI: soft, non-tender; bowel sounds normal; no masses,  no organomegaly Extremities: extremities normal, atraumatic, no cyanosis or edema Neurologic: Alert and oriented X 3, normal strength and tone. Normal symmetric reflexes. Normal coordination and gait  ECOG PERFORMANCE STATUS: 1 - Symptomatic but completely ambulatory  Blood pressure 134/59, pulse 104, temperature 98.1 F (36.7 C), temperature source Oral, resp. rate 20, height '5\' 10"'$  (1.778 m), weight 200 lb 8 oz (90.946 kg), SpO2 99 %.  LABORATORY DATA: Lab Results  Component Value Date   WBC 7.7 02/03/2015   HGB 8.1* 02/03/2015   HCT 24.1* 02/03/2015   MCV 93.1 02/03/2015   PLT 92* 02/03/2015      Chemistry      Component Value Date/Time   NA 137 02/03/2015 0921   NA 134* 10/25/2014 0551   K 4.3 02/03/2015 0921   K 4.9 10/25/2014 0551   CL 101 10/25/2014 0551   CO2 24 02/03/2015 0921   CO2 27 10/25/2014 0551   BUN 19.9 02/03/2015 0921   BUN 27* 10/25/2014 0551   CREATININE 1.2 02/03/2015 0921   CREATININE 1.41* 10/25/2014 0551      Component Value Date/Time   CALCIUM 8.8 02/03/2015 0921   CALCIUM 10.0 10/25/2014 0551   ALKPHOS 80 02/03/2015 0921   ALKPHOS 80 10/23/2014 2230   AST 15 02/03/2015 0921   AST 24 10/23/2014 2230   ALT 12 02/03/2015 0921   ALT 32 10/23/2014 2230   BILITOT 0.30 02/03/2015 0921   BILITOT 0.5 10/23/2014 2230       RADIOGRAPHIC STUDIES: Ct Chest W Contrast  01/14/2015   CLINICAL DATA:  Subsequent encounter for lung cancer.  EXAM: CT CHEST, ABDOMEN, AND PELVIS WITH CONTRAST  TECHNIQUE: Multidetector CT imaging of the chest, abdomen and pelvis was performed following the standard protocol during bolus administration of intravenous contrast.  CONTRAST:  144m OMNIPAQUE IOHEXOL 300 MG/ML  SOLN  COMPARISON:  PET-CT from 11/06/2014. Chest abdomen and pelvis CT from 10/24/2014.  FINDINGS: CT CHEST  FINDINGS  Mediastinum/Nodes: There is no axillary lymphadenopathy. No mediastinal or hilar lymphadenopathy. The nonenlarged AP window and right paratracheal lymph node seen on the recent PET-CT are stable. Thoracic esophagus is unremarkable. Thyroid gland has normal CT imaging features. Heart size is normal. Coronary artery calcification is noted. No pericardial effusion.  Lungs/Pleura: Anterior left upper lobe pulmonary nodule seen to be hypermetabolic on recent PET-CT has a more elongated but narrow configuration on today's study, measuring 18 x 6 mm today compared to 13 x 9 mm previously.  A second left upper lobe hypermetabolic pulmonary nodules seen previously is seen more posteriorly in the left upper lobe (image 21 series 4. This has decreased substantially in size in the interval, now measuring 5 mm compared to 13 mm previously.  There are numerous other very tiny scattered bilateral pulmonary nodules, most of which are subpleural. Changes of emphysema noted. No focal airspace consolidation. No pulmonary edema. No evidence for pleural effusion.  Musculoskeletal: Bone windows reveal no worrisome lytic or sclerotic osseous lesions.  CT ABDOMEN AND PELVIS FINDINGS  Hepatobiliary: No focal abnormality within the liver parenchyma. There is no evidence for gallstones, gallbladder wall thickening, or pericholecystic fluid. No intrahepatic or extrahepatic biliary dilation.  Pancreas: No focal mass lesion. No dilatation  of the main duct. No intraparenchymal cyst. No peripancreatic edema.  Spleen: No splenomegaly. No focal mass lesion.  Adrenals/Urinary Tract: No adrenal nodule or mass. 9 mm well-defined low-density lesion in the interpolar left kidney is compatible with a cyst. No enhancing lesion or hydronephrosis in either kidney.  Stomach/Bowel: Tiny hiatal hernia noted. Stomach otherwise unremarkable. Duodenum is normally positioned as is the ligament of Treitz. No small bowel wall thickening. No small bowel  dilatation. Terminal ileum normal. The appendix is not visualized, but there is no edema or inflammation in the region of the cecum. No gross colonic mass. No colonic wall thickening. No substantial diverticular change.  Vascular/Lymphatic: There is abdominal aortic atherosclerosis without aneurysm. No lymphadenopathy in the abdomen or pelvis.  Reproductive: Mild prostatomegaly and seminal vesicles are unremarkable.  Other: No intraperitoneal free fluid.  Musculoskeletal: Sclerosis in the right femoral neck is stable. No worrisome focal lytic or sclerotic osseous abnormality. Stable compression deformity at T12 and L1.  IMPRESSION: 1. One of the 2 hypermetabolic left upper lobe pulmonary nodules has almost nearly resolved in the interval. The other has undergone some evolution in shape, now appearing more elongated than round and now measuring 18 x 6 mm compared to 13 x 9 mm previously. 2. No other new or progressive findings in the chest. 3. Emphysema. 4. Small stable probable cyst in the left kidney.   Electronically Signed   By: Misty Stanley M.D.   On: 01/14/2015 08:47   Ct Abdomen Pelvis W Contrast  01/14/2015   CLINICAL DATA:  Subsequent encounter for lung cancer.  EXAM: CT CHEST, ABDOMEN, AND PELVIS WITH CONTRAST  TECHNIQUE: Multidetector CT imaging of the chest, abdomen and pelvis was performed following the standard protocol during bolus administration of intravenous contrast.  CONTRAST:  139m OMNIPAQUE IOHEXOL 300 MG/ML  SOLN  COMPARISON:  PET-CT from 11/06/2014. Chest abdomen and pelvis CT from 10/24/2014.  FINDINGS: CT CHEST FINDINGS  Mediastinum/Nodes: There is no axillary lymphadenopathy. No mediastinal or hilar lymphadenopathy. The nonenlarged AP window and right paratracheal lymph node seen on the recent PET-CT are stable. Thoracic esophagus is unremarkable. Thyroid gland has normal CT imaging features. Heart size is normal. Coronary artery calcification is noted. No pericardial effusion.   Lungs/Pleura: Anterior left upper lobe pulmonary nodule seen to be hypermetabolic on recent PET-CT has a more elongated but narrow configuration on today's study, measuring 18 x 6 mm today compared to 13 x 9 mm previously.  A second left upper lobe hypermetabolic pulmonary nodules seen previously is seen more posteriorly in the left upper lobe (image 21 series 4. This has decreased substantially in size in the interval, now measuring 5 mm compared to 13 mm previously.  There are numerous other very tiny scattered bilateral pulmonary nodules, most of which are subpleural. Changes of emphysema noted. No focal airspace consolidation. No pulmonary edema. No evidence for pleural effusion.  Musculoskeletal: Bone windows reveal no worrisome lytic or sclerotic osseous lesions.  CT ABDOMEN AND PELVIS FINDINGS  Hepatobiliary: No focal abnormality within the liver parenchyma. There is no evidence for gallstones, gallbladder wall thickening, or pericholecystic fluid. No intrahepatic or extrahepatic biliary dilation.  Pancreas: No focal mass lesion. No dilatation of the main duct. No intraparenchymal cyst. No peripancreatic edema.  Spleen: No splenomegaly. No focal mass lesion.  Adrenals/Urinary Tract: No adrenal nodule or mass. 9 mm well-defined low-density lesion in the interpolar left kidney is compatible with a cyst. No enhancing lesion or hydronephrosis in either kidney.  Stomach/Bowel: Tiny  hiatal hernia noted. Stomach otherwise unremarkable. Duodenum is normally positioned as is the ligament of Treitz. No small bowel wall thickening. No small bowel dilatation. Terminal ileum normal. The appendix is not visualized, but there is no edema or inflammation in the region of the cecum. No gross colonic mass. No colonic wall thickening. No substantial diverticular change.  Vascular/Lymphatic: There is abdominal aortic atherosclerosis without aneurysm. No lymphadenopathy in the abdomen or pelvis.  Reproductive: Mild prostatomegaly  and seminal vesicles are unremarkable.  Other: No intraperitoneal free fluid.  Musculoskeletal: Sclerosis in the right femoral neck is stable. No worrisome focal lytic or sclerotic osseous abnormality. Stable compression deformity at T12 and L1.  IMPRESSION: 1. One of the 2 hypermetabolic left upper lobe pulmonary nodules has almost nearly resolved in the interval. The other has undergone some evolution in shape, now appearing more elongated than round and now measuring 18 x 6 mm compared to 13 x 9 mm previously. 2. No other new or progressive findings in the chest. 3. Emphysema. 4. Small stable probable cyst in the left kidney.   Electronically Signed   By: Misty Stanley M.D.   On: 01/14/2015 08:47    ASSESSMENT AND PLAN: This is a very pleasant 81 years old white male recently diagnosed with limited stage small cell lung cancer presented with a 2 left upper lobe pulmonary nodules as well as questionable mediastinal lymphadenopathy.  He is currently undergoing systemic chemotherapy with carboplatin and etoposide status post 3 cycles concurrent with radiation which was completed 01/15/2015. The patient is tolerating his treatment fairly well with no significant adverse effects. The last CT scan of the chest showed 1 of the 2 hypermetabolic left upper lobe pulmonary nodules has almost nearly resolved and the other nodule has a change in shape and looks slightly larger. The patient was discussed with and also seen by Dr. [name]. His hemoglobin today is 8.1 g/dL with a platelet count of 92,000. He does not have any active bleeding or bruising. We will proceed with cycle #4 of his systemic chemotherapy with carboplatin and etoposide with Neulasta support as scheduled today however, we will arrange to transfuse him a total of 2 units of packed red blood cells. The patient is in agreement with this plan. He will continue with weekly labs as scheduled. He will follow-up in 3 weeks prior to the start of cycle #5 with  a restaging CT scan of the chest with contrast to reevaluate his disease. He was advised to call immediately if he has any concerning symptoms in the interval. The patient voices understanding of current disease status and treatment options and is in agreement with the current care plan.  All questions were answered. The patient knows to call the clinic with any problems, questions or concerns. We can certainly see the patient much sooner if necessary.  Carlton Adam, PA-C 02/03/2015  ADDENDUM: Hematology/Oncology Attending:  I had a face to face encounter with the patient. I recommended his care plan. This is a very pleasant 81 years old white male with limited stage small cell lung cancer, currently undergoing systemic chemotherapy with carboplatin and etoposide status post 3 cycles. The patient is tolerating his systemic chemotherapy fairly well except for the fatigue from the chemotherapy-induced anemia.  I recommended for the patient to proceed with cycle #4 today as a scheduled.  For the chemotherapy-induced anemia, I will arrange for the patient to receive 2 units of PRBCs transfusion this week.  The patient would come back for  follow-up visit in 3 weeks for reevaluation after repeating CT scan of the chest for restaging of his disease.  He was advised to call immediately if he has any concerning symptoms in the interval.  Disclaimer: This note was dictated with voice recognition software. Similar sounding words can inadvertently be transcribed and may be missed upon review. Eilleen Kempf., MD 02/04/2015

## 2015-02-03 NOTE — Telephone Encounter (Signed)
per pof to sch pt appt-gave pt copy of avs °

## 2015-02-03 NOTE — Progress Notes (Signed)
Okay to treat today despite Hgb of 8.1 an Platelets at 92, per Adrena, PA.

## 2015-02-03 NOTE — Patient Instructions (Signed)
[facility] Discharge Instructions for Patients Receiving Chemotherapy  Today you received the following chemotherapy agents Etoposide/Carboplatin  To help prevent nausea and vomiting after your treatment, we encourage you to take your nausea medication     If you develop nausea and vomiting that is not controlled by your nausea medication, call the clinic.   BELOW ARE SYMPTOMS THAT SHOULD BE REPORTED IMMEDIATELY:  *FEVER GREATER THAN 100.5 F  *CHILLS WITH OR WITHOUT FEVER  NAUSEA AND VOMITING THAT IS NOT CONTROLLED WITH YOUR NAUSEA MEDICATION  *UNUSUAL SHORTNESS OF BREATH  *UNUSUAL BRUISING OR BLEEDING  TENDERNESS IN MOUTH AND THROAT WITH OR WITHOUT PRESENCE OF ULCERS  *URINARY PROBLEMS  *BOWEL PROBLEMS  UNUSUAL RASH Items with * indicate a potential emergency and should be followed up as soon as possible.  Feel free to call the clinic you have any questions or concerns. The clinic phone number is [phone].  Please show the [facility] at check-in to the Emergency Department and triage nurse.

## 2015-02-04 ENCOUNTER — Other Ambulatory Visit: Payer: Self-pay | Admitting: *Deleted

## 2015-02-04 ENCOUNTER — Ambulatory Visit (HOSPITAL_BASED_OUTPATIENT_CLINIC_OR_DEPARTMENT_OTHER): Payer: Medicare Other

## 2015-02-04 VITALS — BP 124/57 | HR 78 | Temp 97.8°F | Resp 18

## 2015-02-04 DIAGNOSIS — C3412 Malignant neoplasm of upper lobe, left bronchus or lung: Secondary | ICD-10-CM

## 2015-02-04 DIAGNOSIS — Z5111 Encounter for antineoplastic chemotherapy: Secondary | ICD-10-CM

## 2015-02-04 DIAGNOSIS — D6481 Anemia due to antineoplastic chemotherapy: Secondary | ICD-10-CM

## 2015-02-04 DIAGNOSIS — D6489 Other specified anemias: Secondary | ICD-10-CM | POA: Diagnosis not present

## 2015-02-04 DIAGNOSIS — T451X5A Adverse effect of antineoplastic and immunosuppressive drugs, initial encounter: Principal | ICD-10-CM

## 2015-02-04 LAB — PREPARE RBC (CROSSMATCH)

## 2015-02-04 MED ORDER — DIPHENHYDRAMINE HCL 25 MG PO CAPS
25.0000 mg | ORAL_CAPSULE | Freq: Once | ORAL | Status: AC
  Administered 2015-02-04: 25 mg via ORAL

## 2015-02-04 MED ORDER — ACETAMINOPHEN 325 MG PO TABS
650.0000 mg | ORAL_TABLET | Freq: Once | ORAL | Status: AC
  Administered 2015-02-04: 650 mg via ORAL

## 2015-02-04 MED ORDER — DIPHENHYDRAMINE HCL 25 MG PO CAPS
ORAL_CAPSULE | ORAL | Status: AC
  Filled 2015-02-04: qty 1

## 2015-02-04 MED ORDER — SODIUM CHLORIDE 0.9 % IV SOLN
120.0000 mg/m2 | Freq: Once | INTRAVENOUS | Status: AC
  Administered 2015-02-04: 260 mg via INTRAVENOUS
  Filled 2015-02-04: qty 13

## 2015-02-04 MED ORDER — SODIUM CHLORIDE 0.9 % IV SOLN: Freq: Once | INTRAVENOUS | Status: DC

## 2015-02-04 MED ORDER — SODIUM CHLORIDE 0.9 % IV SOLN
Freq: Once | INTRAVENOUS | Status: AC
  Administered 2015-02-04: 12:00:00 via INTRAVENOUS
  Filled 2015-02-04: qty 4

## 2015-02-04 MED ORDER — ACETAMINOPHEN 325 MG PO TABS
ORAL_TABLET | ORAL | Status: AC
  Filled 2015-02-04: qty 2

## 2015-02-04 MED ORDER — SODIUM CHLORIDE 0.9 % IV SOLN
250.0000 mL | Freq: Once | INTRAVENOUS | Status: AC
  Administered 2015-02-04: 250 mL via INTRAVENOUS

## 2015-02-04 NOTE — Patient Instructions (Signed)
[facility] Discharge Instructions for Patients Receiving Chemotherapy  Today you received the following chemotherapy agents Etoposide.   To help prevent nausea and vomiting after your treatment, we encourage you to take your nausea medication as directed.    If you develop nausea and vomiting that is not controlled by your nausea medication, call the clinic.   BELOW ARE SYMPTOMS THAT SHOULD BE REPORTED IMMEDIATELY:  *FEVER GREATER THAN 100.5 F  *CHILLS WITH OR WITHOUT FEVER  NAUSEA AND VOMITING THAT IS NOT CONTROLLED WITH YOUR NAUSEA MEDICATION  *UNUSUAL SHORTNESS OF BREATH  *UNUSUAL BRUISING OR BLEEDING  TENDERNESS IN MOUTH AND THROAT WITH OR WITHOUT PRESENCE OF ULCERS  *URINARY PROBLEMS  *BOWEL PROBLEMS  UNUSUAL RASH Items with * indicate a potential emergency and should be followed up as soon as possible.  Feel free to call the clinic you have any questions or concerns. The clinic phone number is [phone].  Please show the [facility] at check-in to the Emergency Department and triage nurse.  Blood Transfusion Information WHAT IS A BLOOD TRANSFUSION? A transfusion is the replacement of blood or some of its parts. Blood is made up of multiple cells which provide different functions.  Red blood cells carry oxygen and are used for blood loss replacement.  White blood cells fight against infection.  Platelets control bleeding.  Plasma helps clot blood.  Other blood products are available for specialized needs, such as hemophilia or other clotting disorders. BEFORE THE TRANSFUSION  Who gives blood for transfusions?   You may be able to donate blood to be used at a later date on yourself (autologous donation).  Relatives can be asked to donate blood. This is generally not any safer than if you have received blood from a stranger. The same precautions are taken to ensure safety when a relative's blood is donated.  Healthy volunteers who are  fully evaluated to make sure their blood is safe. This is blood bank blood. Transfusion therapy is the safest it has ever been in the practice of medicine. Before blood is taken from a donor, a complete history is taken to make sure that person has no history of diseases nor engages in risky social behavior (examples are intravenous drug use or sexual activity with multiple partners). The donor's travel history is screened to minimize risk of transmitting infections, such as malaria. The donated blood is tested for signs of infectious diseases, such as HIV and hepatitis. The blood is then tested to be sure it is compatible with you in order to minimize the chance of a transfusion reaction. If you or a relative donates blood, this is often done in anticipation of surgery and is not appropriate for emergency situations. It takes many days to process the donated blood. RISKS AND COMPLICATIONS Although transfusion therapy is very safe and saves many lives, the main dangers of transfusion include:   Getting an infectious disease.  Developing a transfusion reaction. This is an allergic reaction to something in the blood you were given. Every precaution is taken to prevent this. The decision to have a blood transfusion has been considered carefully by your caregiver before blood is given. Blood is not given unless the benefits outweigh the risks. AFTER THE TRANSFUSION  Right after receiving a blood transfusion, you will usually feel much better and more energetic. This is especially true if your red blood cells have gotten low (anemic). The transfusion raises the level of the red blood cells which carry oxygen,  and this usually causes an energy increase.  The nurse administering the transfusion will monitor you carefully for complications. HOME CARE INSTRUCTIONS  No special instructions are needed after a transfusion. You may find your energy is better. Speak with your caregiver about any limitations on  activity for underlying diseases you may have. SEEK MEDICAL CARE IF:   Your condition is not improving after your transfusion.  You develop redness or irritation at the intravenous (IV) site. SEEK IMMEDIATE MEDICAL CARE IF:  Any of the following symptoms occur over the next 12 hours:  Shaking chills.  You have a temperature by mouth above 102 F (38.9 C), not controlled by medicine.  Chest, back, or muscle pain.  People around you feel you are not acting correctly or are confused.  Shortness of breath or difficulty breathing.  Dizziness and fainting.  You get a rash or develop hives.  You have a decrease in urine output.  Your urine turns a dark color or changes to pink, red, or brown. Any of the following symptoms occur over the next 10 days:  You have a temperature by mouth above 102 F (38.9 C), not controlled by medicine.  Shortness of breath.  Weakness after normal activity.  The white part of the eye turns yellow (jaundice).  You have a decrease in the amount of urine or are urinating less often.  Your urine turns a dark color or changes to pink, red, or brown. Document Released: 07/22/2000 Document Revised: 10/17/2011 Document Reviewed: 03/10/2008 [facility] Patient Information 2015 [place], [place]. This information is not intended to replace advice given to you by your health care provider. Make sure you discuss any questions you have with your health care provider.

## 2015-02-05 ENCOUNTER — Ambulatory Visit (HOSPITAL_BASED_OUTPATIENT_CLINIC_OR_DEPARTMENT_OTHER): Payer: Medicare Other

## 2015-02-05 VITALS — BP 142/76 | HR 58 | Temp 97.8°F | Resp 17

## 2015-02-05 DIAGNOSIS — D6481 Anemia due to antineoplastic chemotherapy: Secondary | ICD-10-CM

## 2015-02-05 DIAGNOSIS — T451X5A Adverse effect of antineoplastic and immunosuppressive drugs, initial encounter: Secondary | ICD-10-CM

## 2015-02-05 DIAGNOSIS — Z5111 Encounter for antineoplastic chemotherapy: Secondary | ICD-10-CM

## 2015-02-05 DIAGNOSIS — D6489 Other specified anemias: Secondary | ICD-10-CM | POA: Diagnosis not present

## 2015-02-05 DIAGNOSIS — C3412 Malignant neoplasm of upper lobe, left bronchus or lung: Secondary | ICD-10-CM

## 2015-02-05 MED ORDER — SODIUM CHLORIDE 0.9 % IV SOLN
Freq: Once | INTRAVENOUS | Status: AC
  Administered 2015-02-05: 11:00:00 via INTRAVENOUS

## 2015-02-05 MED ORDER — SODIUM CHLORIDE 0.9 % IV SOLN
Freq: Once | INTRAVENOUS | Status: AC
  Administered 2015-02-05: 12:00:00 via INTRAVENOUS
  Filled 2015-02-05: qty 4

## 2015-02-05 MED ORDER — ACETAMINOPHEN 325 MG PO TABS
650.0000 mg | ORAL_TABLET | Freq: Once | ORAL | Status: AC
  Administered 2015-02-05: 650 mg via ORAL

## 2015-02-05 MED ORDER — DIPHENHYDRAMINE HCL 25 MG PO CAPS
ORAL_CAPSULE | ORAL | Status: AC
  Filled 2015-02-05: qty 1

## 2015-02-05 MED ORDER — SODIUM CHLORIDE 0.9 % IV SOLN
120.0000 mg/m2 | Freq: Once | INTRAVENOUS | Status: AC
  Administered 2015-02-05: 260 mg via INTRAVENOUS
  Filled 2015-02-05: qty 13

## 2015-02-05 MED ORDER — ACETAMINOPHEN 325 MG PO TABS
ORAL_TABLET | ORAL | Status: AC
  Filled 2015-02-05: qty 2

## 2015-02-05 MED ORDER — DIPHENHYDRAMINE HCL 25 MG PO CAPS
25.0000 mg | ORAL_CAPSULE | Freq: Once | ORAL | Status: AC
  Administered 2015-02-05: 25 mg via ORAL

## 2015-02-05 NOTE — Patient Instructions (Signed)
[facility] Discharge Instructions for Patients Receiving Chemotherapy  Today you received the following chemotherapy agents: Etoposide  To help prevent nausea and vomiting after your treatment, we encourage you to take your nausea medication as prescribed by your physician.   If you develop nausea and vomiting that is not controlled by your nausea medication, call the clinic.   BELOW ARE SYMPTOMS THAT SHOULD BE REPORTED IMMEDIATELY:  *FEVER GREATER THAN 100.5 F  *CHILLS WITH OR WITHOUT FEVER  NAUSEA AND VOMITING THAT IS NOT CONTROLLED WITH YOUR NAUSEA MEDICATION  *UNUSUAL SHORTNESS OF BREATH  *UNUSUAL BRUISING OR BLEEDING  TENDERNESS IN MOUTH AND THROAT WITH OR WITHOUT PRESENCE OF ULCERS  *URINARY PROBLEMS  *BOWEL PROBLEMS  UNUSUAL RASH Items with * indicate a potential emergency and should be followed up as soon as possible.  Feel free to call the clinic you have any questions or concerns. The clinic phone number is [phone].  Please show the [facility] at check-in to the Emergency Department and triage nurse.  Blood Transfusion Information WHAT IS A BLOOD TRANSFUSION? A transfusion is the replacement of blood or some of its parts. Blood is made up of multiple cells which provide different functions.  Red blood cells carry oxygen and are used for blood loss replacement.  White blood cells fight against infection.  Platelets control bleeding.  Plasma helps clot blood.  Other blood products are available for specialized needs, such as hemophilia or other clotting disorders. BEFORE THE TRANSFUSION  Who gives blood for transfusions?   You may be able to donate blood to be used at a later date on yourself (autologous donation).  Relatives can be asked to donate blood. This is generally not any safer than if you have received blood from a stranger. The same precautions are taken to ensure safety when a relative's blood is donated.  Healthy  volunteers who are fully evaluated to make sure their blood is safe. This is blood bank blood. Transfusion therapy is the safest it has ever been in the practice of medicine. Before blood is taken from a donor, a complete history is taken to make sure that person has no history of diseases nor engages in risky social behavior (examples are intravenous drug use or sexual activity with multiple partners). The donor's travel history is screened to minimize risk of transmitting infections, such as malaria. The donated blood is tested for signs of infectious diseases, such as HIV and hepatitis. The blood is then tested to be sure it is compatible with you in order to minimize the chance of a transfusion reaction. If you or a relative donates blood, this is often done in anticipation of surgery and is not appropriate for emergency situations. It takes many days to process the donated blood. RISKS AND COMPLICATIONS Although transfusion therapy is very safe and saves many lives, the main dangers of transfusion include:   Getting an infectious disease.  Developing a transfusion reaction. This is an allergic reaction to something in the blood you were given. Every precaution is taken to prevent this. The decision to have a blood transfusion has been considered carefully by your caregiver before blood is given. Blood is not given unless the benefits outweigh the risks. AFTER THE TRANSFUSION  Right after receiving a blood transfusion, you will usually feel much better and more energetic. This is especially true if your red blood cells have gotten low (anemic). The transfusion raises the level of the red blood cells which carry  oxygen, and this usually causes an energy increase.  The nurse administering the transfusion will monitor you carefully for complications. HOME CARE INSTRUCTIONS  No special instructions are needed after a transfusion. You may find your energy is better. Speak with your caregiver about any  limitations on activity for underlying diseases you may have. SEEK MEDICAL CARE IF:   Your condition is not improving after your transfusion.  You develop redness or irritation at the intravenous (IV) site. SEEK IMMEDIATE MEDICAL CARE IF:  Any of the following symptoms occur over the next 12 hours:  Shaking chills.  You have a temperature by mouth above 102 F (38.9 C), not controlled by medicine.  Chest, back, or muscle pain.  People around you feel you are not acting correctly or are confused.  Shortness of breath or difficulty breathing.  Dizziness and fainting.  You get a rash or develop hives.  You have a decrease in urine output.  Your urine turns a dark color or changes to pink, red, or brown. Any of the following symptoms occur over the next 10 days:  You have a temperature by mouth above 102 F (38.9 C), not controlled by medicine.  Shortness of breath.  Weakness after normal activity.  The white part of the eye turns yellow (jaundice).  You have a decrease in the amount of urine or are urinating less often.  Your urine turns a dark color or changes to pink, red, or brown. Document Released: 07/22/2000 Document Revised: 10/17/2011 Document Reviewed: 03/10/2008 [facility] Patient Information 2015 [place], [place]. This information is not intended to replace advice given to you by your health care provider. Make sure you discuss any questions you have with your health care provider.

## 2015-02-06 LAB — TYPE AND SCREEN
ABO/RH(D): A POS
ANTIBODY SCREEN: NEGATIVE
UNIT DIVISION: 0
Unit division: 0

## 2015-02-07 ENCOUNTER — Ambulatory Visit (HOSPITAL_BASED_OUTPATIENT_CLINIC_OR_DEPARTMENT_OTHER): Payer: Medicare Other

## 2015-02-07 VITALS — BP 135/72 | HR 76 | Temp 98.5°F

## 2015-02-07 DIAGNOSIS — Z5189 Encounter for other specified aftercare: Secondary | ICD-10-CM | POA: Diagnosis not present

## 2015-02-07 DIAGNOSIS — C3412 Malignant neoplasm of upper lobe, left bronchus or lung: Secondary | ICD-10-CM | POA: Diagnosis not present

## 2015-02-07 MED ORDER — PEGFILGRASTIM INJECTION 6 MG/0.6ML ~~LOC~~
6.0000 mg | PREFILLED_SYRINGE | Freq: Once | SUBCUTANEOUS | Status: AC
  Administered 2015-02-07: 6 mg via SUBCUTANEOUS

## 2015-02-23 ENCOUNTER — Encounter (HOSPITAL_COMMUNITY): Payer: Self-pay

## 2015-02-23 ENCOUNTER — Ambulatory Visit (HOSPITAL_COMMUNITY)
Admission: RE | Admit: 2015-02-23 | Discharge: 2015-02-23 | Disposition: A | Discharge status: Home / Self Care | Payer: Medicare Other | Source: Ambulatory Visit | Attending: Physician Assistant | Admitting: Physician Assistant

## 2015-02-23 DIAGNOSIS — C3492 Malignant neoplasm of unspecified part of left bronchus or lung: Secondary | ICD-10-CM | POA: Insufficient documentation

## 2015-02-23 DIAGNOSIS — C3412 Malignant neoplasm of upper lobe, left bronchus or lung: Secondary | ICD-10-CM

## 2015-02-23 DIAGNOSIS — Z08 Encounter for follow-up examination after completed treatment for malignant neoplasm: Secondary | ICD-10-CM | POA: Insufficient documentation

## 2015-02-23 MED ORDER — IOHEXOL 300 MG/ML  SOLN
100.0000 mL | Freq: Once | INTRAMUSCULAR | Status: AC | PRN
  Administered 2015-02-23: 80 mL via INTRAVENOUS

## 2015-02-24 ENCOUNTER — Ambulatory Visit (HOSPITAL_COMMUNITY)
Admission: RE | Admit: 2015-02-24 | Discharge: 2015-02-24 | Disposition: A | Discharge status: Home / Self Care | Payer: Medicare Other | Source: Ambulatory Visit | Attending: Internal Medicine | Admitting: Internal Medicine

## 2015-02-24 ENCOUNTER — Ambulatory Visit: Payer: Medicare Other

## 2015-02-24 ENCOUNTER — Other Ambulatory Visit (HOSPITAL_BASED_OUTPATIENT_CLINIC_OR_DEPARTMENT_OTHER): Payer: Medicare Other

## 2015-02-24 ENCOUNTER — Telehealth: Payer: Self-pay | Admitting: Internal Medicine

## 2015-02-24 ENCOUNTER — Ambulatory Visit (HOSPITAL_BASED_OUTPATIENT_CLINIC_OR_DEPARTMENT_OTHER): Payer: Medicare Other

## 2015-02-24 ENCOUNTER — Encounter: Payer: Self-pay | Admitting: Physician Assistant

## 2015-02-24 ENCOUNTER — Ambulatory Visit (HOSPITAL_BASED_OUTPATIENT_CLINIC_OR_DEPARTMENT_OTHER): Payer: Medicare Other | Admitting: Physician Assistant

## 2015-02-24 VITALS — BP 146/60 | HR 83 | Temp 97.9°F | Resp 18 | Ht 70.0 in | Wt 202.2 lb

## 2015-02-24 VITALS — BP 131/77 | HR 62 | Temp 97.1°F | Resp 18

## 2015-02-24 DIAGNOSIS — C3412 Malignant neoplasm of upper lobe, left bronchus or lung: Secondary | ICD-10-CM

## 2015-02-24 DIAGNOSIS — D6481 Anemia due to antineoplastic chemotherapy: Secondary | ICD-10-CM | POA: Insufficient documentation

## 2015-02-24 DIAGNOSIS — T451X5A Adverse effect of antineoplastic and immunosuppressive drugs, initial encounter: Secondary | ICD-10-CM | POA: Insufficient documentation

## 2015-02-24 DIAGNOSIS — C3492 Malignant neoplasm of unspecified part of left bronchus or lung: Secondary | ICD-10-CM

## 2015-02-24 LAB — CBC WITH DIFFERENTIAL/PLATELET
BASO%: 0.3 % (ref 0.0–2.0)
Basophils Absolute: 0 10*3/uL (ref 0.0–0.1)
EOS%: 0.4 % (ref 0.0–7.0)
Eosinophils Absolute: 0 10*3/uL (ref 0.0–0.5)
HEMATOCRIT: 23.3 % — AB (ref 38.4–49.9)
HGB: 7.8 g/dL — ABNORMAL LOW (ref 13.0–17.1)
LYMPH%: 10.8 % — AB (ref 14.0–49.0)
MCH: 31.2 pg (ref 27.2–33.4)
MCHC: 33.4 g/dL (ref 32.0–36.0)
MCV: 93.4 fL (ref 79.3–98.0)
MONO#: 0.8 10*3/uL (ref 0.1–0.9)
MONO%: 10.4 % (ref 0.0–14.0)
NEUT#: 6 10*3/uL (ref 1.5–6.5)
NEUT%: 78.1 % — AB (ref 39.0–75.0)
PLATELETS: 102 10*3/uL — AB (ref 140–400)
RBC: 2.49 10*6/uL — AB (ref 4.20–5.82)
RDW: 15.2 % — ABNORMAL HIGH (ref 11.0–14.6)
WBC: 7.7 10*3/uL (ref 4.0–10.3)
lymph#: 0.8 10*3/uL — ABNORMAL LOW (ref 0.9–3.3)

## 2015-02-24 LAB — COMPREHENSIVE METABOLIC PANEL (CC13)
ALT: 10 U/L (ref 0–55)
AST: 13 U/L (ref 5–34)
Albumin: 3.6 g/dL (ref 3.5–5.0)
Alkaline Phosphatase: 76 U/L (ref 40–150)
Anion Gap: 10 mEq/L (ref 3–11)
BUN: 15.8 mg/dL (ref 7.0–26.0)
CHLORIDE: 108 meq/L (ref 98–109)
CO2: 21 meq/L — AB (ref 22–29)
Calcium: 9.8 mg/dL (ref 8.4–10.4)
Creatinine: 1.2 mg/dL (ref 0.7–1.3)
EGFR: 55 mL/min/{1.73_m2} — ABNORMAL LOW (ref >90)
Glucose: 175 mg/dl — ABNORMAL HIGH (ref 70–140)
POTASSIUM: 4.4 meq/L (ref 3.5–5.1)
SODIUM: 139 meq/L (ref 136–145)
TOTAL PROTEIN: 6.6 g/dL (ref 6.4–8.3)
Total Bilirubin: 0.26 mg/dL (ref 0.20–1.20)

## 2015-02-24 LAB — HOLD TUBE, BLOOD BANK

## 2015-02-24 MED ORDER — DIPHENHYDRAMINE HCL 25 MG PO CAPS
ORAL_CAPSULE | ORAL | Status: AC
  Filled 2015-02-24: qty 1

## 2015-02-24 MED ORDER — ACETAMINOPHEN 325 MG PO TABS
650.0000 mg | ORAL_TABLET | Freq: Once | ORAL | Status: AC
  Administered 2015-02-24: 650 mg via ORAL

## 2015-02-24 MED ORDER — ACETAMINOPHEN 325 MG PO TABS
ORAL_TABLET | ORAL | Status: AC
  Filled 2015-02-24: qty 2

## 2015-02-24 MED ORDER — SODIUM CHLORIDE 0.9 % IV SOLN
250.0000 mL | Freq: Once | INTRAVENOUS | Status: AC
  Administered 2015-02-24: 250 mL via INTRAVENOUS

## 2015-02-24 MED ORDER — DIPHENHYDRAMINE HCL 25 MG PO CAPS
25.0000 mg | ORAL_CAPSULE | Freq: Once | ORAL | Status: AC
  Administered 2015-02-24: 25 mg via ORAL

## 2015-02-24 NOTE — Progress Notes (Addendum)
[facility] [phone]   [phone]  OFFICE PROGRESS NOTE  [name], MD [place] [address]  DIAGNOSIS: Limited stage (T3, N2, M0) small cell lung cancer diagnosed in February 2016 presenting with 2 pulmonary nodules in the left upper lobe as well as questionable mediastinal lymphadenopathy.  PRIOR THERAPY: concurrent with radiotherapy under the care of Dr. Tammi Klippel completed 01/15/2015  CURRENT THERAPY: Systemic chemotherapy with carboplatin for AUC of 5 on day 1 and etoposide 120 MG/M2 on days 1, 2 and 3 with Neulasta support on day 4. First dose on 12/02/2014. He is status post 4 cycles.   INTERVAL HISTORY: Travis Medina 81 y.o. male returns to the clinic today for follow-up visit accompanied by his wife. The patient is feeling fine today with no specific complaints except for increased fatigue.  He notes some increased shortness of breath recently. He is tolerating his current systemic chemotherapy with carboplatin and etoposide fairly well with no significant adverse effects other than the fatigue. He completed a course of concurrent radiotherapy under the care of Dr. Tammi Klippel on 01/15/2015. He denied having any significant chest pain,  cough or hemoptysis. The patient denied having any significant nausea or vomiting, no fever or chills. He has no significant weight loss or night sweats. He presents to proceed with cycle #5 of his systemic chemotherapy with carboplatin and etoposide with Neulasta support.  MEDICAL HISTORY: Past Medical History  Diagnosis Date  . Essential hypertension   . Lung cancer     ALLERGIES:  is allergic to morphine and related.  MEDICATIONS:  Current Outpatient Prescriptions  Medication Sig Dispense Refill  . acetaminophen (TYLENOL) 500 MG tablet Take 1 tablet (500 mg total) by mouth every 6 (six) hours as needed for mild pain. 30 tablet 0  . aspirin 325 MG tablet Take 325 mg by mouth daily.      [name] Dextromethorphan Polistirex (DELSYM PO) Take by mouth.    [name] lisinopril (PRINIVIL,ZESTRIL) 5 MG tablet Take 5 mg by mouth daily.    . prochlorperazine (COMPAZINE) 10 MG tablet Take 1 tablet (10 mg total) by mouth every 6 (six) hours as needed for nausea or vomiting. 60 tablet 0  . sucralfate (CARAFATE) 1 G tablet Take 1 tablet (1 g total) by mouth 4 (four) times daily -  with meals and at bedtime. 5 min before food 90 tablet 5  . Wound Cleansers (RADIAPLEX EX) Apply topically.     No current facility-administered medications for this visit.    SURGICAL HISTORY:  Past Surgical History  Procedure Laterality Date  . Total knee arthroplasty      bilateral  . Lung biopsy      REVIEW OF SYSTEMS:  Constitutional: positive for fatigue Eyes: negative Ears, nose, mouth, throat, and face: negative Respiratory: positive for dyspnea on exertion Cardiovascular: negative Gastrointestinal: negative Genitourinary:negative Integument/breast: negative Hematologic/lymphatic: negative Musculoskeletal:positive for bone pain Neurological: positive for weakness Behavioral/Psych: negative Endocrine: negative Allergic/Immunologic: negative   PHYSICAL EXAMINATION: General appearance: alert, cooperative and no distress Head: Normocephalic, without obvious abnormality, atraumatic Neck: no adenopathy, no JVD, supple, symmetrical, trachea midline and thyroid not enlarged, symmetric, no tenderness/mass/nodules Lymph nodes: Cervical, supraclavicular, and axillary nodes normal. Resp: clear to auscultation bilaterally Back: symmetric, no curvature. ROM normal. No CVA tenderness. Cardio: regular rate and rhythm, S1, S2 normal, no murmur, click, rub or gallop GI: soft, non-tender; bowel sounds normal; no masses,  no organomegaly Extremities: extremities normal, atraumatic, no cyanosis  or edema Neurologic: Alert and oriented X 3, normal strength and tone. Normal symmetric reflexes. Normal coordination and  gait  ECOG PERFORMANCE STATUS: 1 - Symptomatic but completely ambulatory  Blood pressure 146/60, pulse 83, temperature 97.9 F (36.6 C), temperature source Oral, resp. rate 18, height '5\' 10"'$  (1.778 m), weight 202 lb 3.2 oz (91.717 kg), SpO2 100 %.  LABORATORY DATA: Lab Results  Component Value Date   WBC 7.7 02/24/2015   HGB 7.8* 02/24/2015   HCT 23.3* 02/24/2015   MCV 93.4 02/24/2015   PLT 102* 02/24/2015      Chemistry      Component Value Date/Time   NA 139 02/24/2015 0821   NA 134* 10/25/2014 0551   K 4.4 02/24/2015 0821   K 4.9 10/25/2014 0551   CL 101 10/25/2014 0551   CO2 21* 02/24/2015 0821   CO2 27 10/25/2014 0551   BUN 15.8 02/24/2015 0821   BUN 27* 10/25/2014 0551   CREATININE 1.2 02/24/2015 0821   CREATININE 1.41* 10/25/2014 0551      Component Value Date/Time   CALCIUM 9.8 02/24/2015 0821   CALCIUM 10.0 10/25/2014 0551   ALKPHOS 76 02/24/2015 0821   ALKPHOS 80 10/23/2014 2230   AST 13 02/24/2015 0821   AST 24 10/23/2014 2230   ALT 10 02/24/2015 0821   ALT 32 10/23/2014 2230   BILITOT 0.26 02/24/2015 0821   BILITOT 0.5 10/23/2014 2230       RADIOGRAPHIC STUDIES: Ct Chest W Contrast  02/23/2015   CLINICAL DATA:  Left lung cancer, chemotherapy and radiation therapy complete.  EXAM: CT CHEST WITH CONTRAST  TECHNIQUE: Multidetector CT imaging of the chest was performed during intravenous contrast administration.  CONTRAST:  27m OMNIPAQUE IOHEXOL 300 MG/ML  SOLN  COMPARISON:  60 16.  FINDINGS: Mediastinum/Nodes: No pathologically enlarged mediastinal, hilar or axillary lymph nodes. Atherosclerotic calcification of the arterial vasculature, including coronary arteries. Heart size normal. No pericardial effusion.  Lungs/Pleura: A few scattered subpleural nodular densities are millimetric and unchanged. Lingular nodule measures 7 mm (6 x 8 mm), new from prior exams. Nodular density in the subpleural aspect of the anterior left upper lobe cannot be accurately  measured due to its somewhat linear and amorphous configuration. It is stable to minimally decreased in prominence from 10/24/2014. Mild upper lobe predominant subpleural reticulation and ground-glass, unchanged. No pleural fluid. Airway is unremarkable.  Upper abdomen: Visualized portions of the liver, gallbladder, adrenal glands, kidneys, spleen, pancreas, stomach and bowel are grossly unremarkable. No upper abdominal adenopathy.  Musculoskeletal: No worrisome lytic or sclerotic lesions. Advanced degenerative changes in the spine. L1 compression fracture is unchanged.  IMPRESSION: 1. Subpleural left upper lobe nodule is difficult to measure given its somewhat amorphous configuration but subjectively, appears stable to minimally decreased in prominence when compared with 01/14/2015. 2. Lingular nodule appears new. Continued attention on followup exams is warranted. 3. Coronary artery calcification.   Electronically Signed   By: MLorin PicketM.D.   On: 02/23/2015 14:15    ASSESSMENT AND PLAN: This is a very pleasant 81years old white male recently diagnosed with limited stage small cell lung cancer presented with a 2 left upper lobe pulmonary nodules as well as questionable mediastinal lymphadenopathy.  He is currently undergoing systemic chemotherapy with carboplatin and etoposide status post 4 cycles concurrent with radiation which was completed 01/15/2015. The patient is tolerating his treatment fairly well with no significant adverse effects. The last CT scan of the chest showed 1 of the 2  hypermetabolic left upper lobe pulmonary nodules has almost nearly resolved and the other nodule has a change in shape and looks slightly larger. The patient was discussed with and also seen by Dr. [name]. His hemoglobin today is 7.8 g/dL. CT scan is relatively stable. We will stop now after he is completed 4 cycles of systemic chemotherapy with carboplatin and etoposide with Neulasta support. We will however arrange  to transfuse him 2 units of packed red blood cells to address his symptomatic chemotherapy-induced anemia. We'll plan to have him return in 3 months with a restaging CT scan of the chest with contrast to reevaluate his disease.The patient is in agreement with this plan.  He was advised to call immediately if he has any concerning symptoms in the interval. The patient voices understanding of current disease status and treatment options and is in agreement with the current care plan.  All questions were answered. The patient knows to call the clinic with any problems, questions or concerns. We can certainly see the patient much sooner if necessary.  Carlton Adam, PA-C 02/24/2015  ADDENDUM: Hematology/Oncology Attending: I had a face to face encounter with the patient. I recommended his care plan. This is a very pleasant 81 years old white male with limited stage small cell lung cancer status post 4 cycles of systemic chemotherapy with carboplatin and etoposide concurrent with radiation under the care of Dr. Tammi Klippel. The patient tolerated his treatment fairly well except for fatigue secondary to chemotherapy-induced anemia. His recent CT scan of the chest showed stable disease and no further regression or progression of his disease. I discussed the scan results with the patient and his wife. I recommended for him to continue on observation for now. I will see him back for follow-up visit in 3 months for reevaluation with repeat CT scan of the chest. The patient also benefit from prophylactic cranial irradiation but this needed to be discussed with Dr. Tammi Klippel because of his age. For the chemotherapy-induced anemia, will arrange for the patient to receive 2 units of PRBCs transfusion. The patient was advised to call immediately if he has any concerning symptoms in the interval.  Disclaimer: This note was dictated with voice recognition software. Similar sounding words can inadvertently be  transcribed and may be missed upon review. Eilleen Kempf., MD 03/01/2015

## 2015-02-24 NOTE — Patient Instructions (Signed)

## 2015-02-24 NOTE — Telephone Encounter (Signed)
Gave and printed appt sched adn avs for pt for OCT

## 2015-02-25 ENCOUNTER — Ambulatory Visit: Payer: Medicare Other

## 2015-02-25 LAB — TYPE AND SCREEN
ABO/RH(D): A POS
ANTIBODY SCREEN: NEGATIVE
UNIT DIVISION: 0
UNIT DIVISION: 0

## 2015-02-25 LAB — PREPARE RBC (CROSSMATCH)

## 2015-02-26 ENCOUNTER — Ambulatory Visit: Payer: Self-pay | Admitting: Radiation Oncology

## 2015-02-26 ENCOUNTER — Ambulatory Visit: Payer: Medicare Other

## 2015-02-26 NOTE — Patient Instructions (Signed)
Your restaging CT scan showed relatively stable disease Your hemoglobin is low and you are feeling more tired more short of breath. We will arrange to give you total of 2 units of blood to address this level of anemia Follow-up in 3 months with a restaging CT scan of your chest to reevaluate your disease

## 2015-02-28 ENCOUNTER — Ambulatory Visit: Payer: Medicare Other

## 2015-05-19 ENCOUNTER — Other Ambulatory Visit: Payer: Medicare Other

## 2015-05-25 ENCOUNTER — Other Ambulatory Visit (HOSPITAL_BASED_OUTPATIENT_CLINIC_OR_DEPARTMENT_OTHER): Payer: Medicare Other

## 2015-05-25 ENCOUNTER — Ambulatory Visit (HOSPITAL_COMMUNITY)
Admission: RE | Admit: 2015-05-25 | Discharge: 2015-05-25 | Disposition: A | Discharge status: Home / Self Care | Payer: Medicare Other | Source: Ambulatory Visit | Attending: Physician Assistant | Admitting: Physician Assistant

## 2015-05-25 ENCOUNTER — Ambulatory Visit (HOSPITAL_COMMUNITY): Payer: Medicare Other

## 2015-05-25 DIAGNOSIS — R5383 Other fatigue: Secondary | ICD-10-CM | POA: Insufficient documentation

## 2015-05-25 DIAGNOSIS — R918 Other nonspecific abnormal finding of lung field: Secondary | ICD-10-CM | POA: Insufficient documentation

## 2015-05-25 DIAGNOSIS — C3412 Malignant neoplasm of upper lobe, left bronchus or lung: Secondary | ICD-10-CM | POA: Diagnosis not present

## 2015-05-25 LAB — CBC WITH DIFFERENTIAL/PLATELET
BASO%: 0.5 % (ref 0.0–2.0)
Basophils Absolute: 0 10*3/uL (ref 0.0–0.1)
EOS ABS: 0.3 10*3/uL (ref 0.0–0.5)
EOS%: 4.7 % (ref 0.0–7.0)
HEMATOCRIT: 35.8 % — AB (ref 38.4–49.9)
HGB: 12 g/dL — ABNORMAL LOW (ref 13.0–17.1)
LYMPH%: 18.7 % (ref 14.0–49.0)
MCH: 32.8 pg (ref 27.2–33.4)
MCHC: 33.5 g/dL (ref 32.0–36.0)
MCV: 97.8 fL (ref 79.3–98.0)
MONO#: 0.7 10*3/uL (ref 0.1–0.9)
MONO%: 10.4 % (ref 0.0–14.0)
NEUT#: 4.2 10*3/uL (ref 1.5–6.5)
NEUT%: 65.7 % (ref 39.0–75.0)
PLATELETS: 143 10*3/uL (ref 140–400)
RBC: 3.66 10*6/uL — ABNORMAL LOW (ref 4.20–5.82)
RDW: 12.5 % (ref 11.0–14.6)
WBC: 6.3 10*3/uL (ref 4.0–10.3)
lymph#: 1.2 10*3/uL (ref 0.9–3.3)

## 2015-05-25 LAB — COMPREHENSIVE METABOLIC PANEL (CC13)
ALK PHOS: 68 U/L (ref 40–150)
ALT: 17 U/L (ref 0–55)
ANION GAP: 8 meq/L (ref 3–11)
AST: 19 U/L (ref 5–34)
Albumin: 3.8 g/dL (ref 3.5–5.0)
BILIRUBIN TOTAL: 0.34 mg/dL (ref 0.20–1.20)
BUN: 23.7 mg/dL (ref 7.0–26.0)
CALCIUM: 9.9 mg/dL (ref 8.4–10.4)
CO2: 24 mEq/L (ref 22–29)
CREATININE: 1.3 mg/dL (ref 0.7–1.3)
Chloride: 107 mEq/L (ref 98–109)
EGFR: 50 mL/min/{1.73_m2} — AB (ref >90)
Glucose: 106 mg/dl (ref 70–140)
Potassium: 4.9 mEq/L (ref 3.5–5.1)
Sodium: 139 mEq/L (ref 136–145)
TOTAL PROTEIN: 7.1 g/dL (ref 6.4–8.3)

## 2015-05-25 MED ORDER — IOHEXOL 300 MG/ML  SOLN
75.0000 mL | Freq: Once | INTRAMUSCULAR | Status: AC | PRN
  Administered 2015-05-25: 75 mL via INTRAVENOUS

## 2015-05-26 ENCOUNTER — Telehealth: Payer: Self-pay | Admitting: Internal Medicine

## 2015-05-26 ENCOUNTER — Encounter: Payer: Self-pay | Admitting: Internal Medicine

## 2015-05-26 ENCOUNTER — Ambulatory Visit (HOSPITAL_BASED_OUTPATIENT_CLINIC_OR_DEPARTMENT_OTHER): Payer: Medicare Other | Admitting: Internal Medicine

## 2015-05-26 VITALS — BP 150/69 | HR 73 | Temp 98.7°F | Resp 18 | Ht 70.0 in | Wt 201.0 lb

## 2015-05-26 DIAGNOSIS — C3412 Malignant neoplasm of upper lobe, left bronchus or lung: Secondary | ICD-10-CM

## 2015-05-26 NOTE — Telephone Encounter (Signed)
per pof to sch pt appt-gave pt copy of avs-adv [facility] will call to sch scan

## 2015-05-26 NOTE — Progress Notes (Signed)
[facility] [phone]   Fax:(336) [phone]  OFFICE PROGRESS NOTE  [name], MD [place] [address]  DIAGNOSIS: Limited stage (T3, N2, M0) small cell lung cancer diagnosed in February 2016 presenting with 2 pulmonary nodules in the left upper lobe as well as questionable mediastinal lymphadenopathy.  PRIOR THERAPY: Systemic chemotherapy with carboplatin for AUC of 5 on day 1 and etoposide 120 MG/M2 on days 1, 2 and 3 with Neulasta support on day 4. First dose on 12/02/2014. He is status post 4 cycles. This is concurrent with radiotherapy under the care of Dr. Tammi Klippel completed on 01/15/2015 and last dose of chemotherapy was on 02/03/2015.  CURRENT THERAPY: Observation.  INTERVAL HISTORY: Travis Medina 81 y.o. male returns to the clinic today for follow-up visit accompanied by his wife. The patient is feeling fine today with no specific complaints. He has been observation for the last few months. He denied having any significant chest pain, shortness of breath, cough or hemoptysis. The patient denied having any significant nausea or vomiting, no fever or chills. He has no significant weight loss or night sweats. He had repeat CT scan of the chest performed recently and he is here for evaluation and discussion of his scan results.  MEDICAL HISTORY: Past Medical History  Diagnosis Date  . Essential hypertension   . Lung cancer ([place])     ALLERGIES:  is allergic to morphine and related.  MEDICATIONS:  Current Outpatient Prescriptions  Medication Sig Dispense Refill  . acetaminophen (TYLENOL) 500 MG tablet Take 1 tablet (500 mg total) by mouth every 6 (six) hours as needed for mild pain. 30 tablet 0  . aspirin 325 MG tablet Take 325 mg by mouth daily.    [name] Dextromethorphan Polistirex (DELSYM PO) Take by mouth.    [name] lisinopril (PRINIVIL,ZESTRIL) 5 MG tablet Take 5 mg by mouth daily.     No current facility-administered medications  for this visit.    SURGICAL HISTORY:  Past Surgical History  Procedure Laterality Date  . Total knee arthroplasty      bilateral  . Lung biopsy      REVIEW OF SYSTEMS:  Constitutional: negative Eyes: negative Ears, nose, mouth, throat, and face: negative Respiratory: negative Cardiovascular: negative Gastrointestinal: negative Genitourinary:negative Integument/breast: negative Hematologic/lymphatic: negative Musculoskeletal:positive for bone pain Neurological: negative Behavioral/Psych: negative Endocrine: negative Allergic/Immunologic: negative   PHYSICAL EXAMINATION: General appearance: alert, cooperative and no distress Head: Normocephalic, without obvious abnormality, atraumatic Neck: no adenopathy, no JVD, supple, symmetrical, trachea midline and thyroid not enlarged, symmetric, no tenderness/mass/nodules Lymph nodes: Cervical, supraclavicular, and axillary nodes normal. Resp: clear to auscultation bilaterally Back: symmetric, no curvature. ROM normal. No CVA tenderness. Cardio: regular rate and rhythm, S1, S2 normal, no murmur, click, rub or gallop GI: soft, non-tender; bowel sounds normal; no masses,  no organomegaly Extremities: extremities normal, atraumatic, no cyanosis or edema Neurologic: Alert and oriented X 3, normal strength and tone. Normal symmetric reflexes. Normal coordination and gait  ECOG PERFORMANCE STATUS: 1 - Symptomatic but completely ambulatory  Blood pressure 150/69, pulse 73, temperature 98.7 F (37.1 C), temperature source Oral, resp. rate 18, height '5\' 10"'$  (1.778 m), weight 201 lb (91.173 kg), SpO2 99 %.  LABORATORY DATA: Lab Results  Component Value Date   WBC 6.3 05/25/2015   HGB 12.0* 05/25/2015   HCT 35.8* 05/25/2015   MCV 97.8 05/25/2015   PLT 143 05/25/2015      Chemistry  Component Value Date/Time   NA 139 05/25/2015 0947   NA 134* 10/25/2014 0551   K 4.9 05/25/2015 0947   K 4.9 10/25/2014 0551   CL 101 10/25/2014  0551   CO2 24 05/25/2015 0947   CO2 27 10/25/2014 0551   BUN 23.7 05/25/2015 0947   BUN 27* 10/25/2014 0551   CREATININE 1.3 05/25/2015 0947   CREATININE 1.41* 10/25/2014 0551      Component Value Date/Time   CALCIUM 9.9 05/25/2015 0947   CALCIUM 10.0 10/25/2014 0551   ALKPHOS 68 05/25/2015 0947   ALKPHOS 80 10/23/2014 2230   AST 19 05/25/2015 0947   AST 24 10/23/2014 2230   ALT 17 05/25/2015 0947   ALT 32 10/23/2014 2230   BILITOT 0.34 05/25/2015 0947   BILITOT 0.5 10/23/2014 2230       RADIOGRAPHIC STUDIES: Ct Chest W Contrast  05/25/2015  CLINICAL DATA:  Left lung cancer restaging. Chemotherapy last June 2016. Intermittent fatigue. EXAM: CT CHEST WITH CONTRAST TECHNIQUE: Multidetector CT imaging of the chest was performed during intravenous contrast administration. CONTRAST:  64m OMNIPAQUE IOHEXOL 300 MG/ML  SOLN COMPARISON:  02/23/2015 FINDINGS: Mediastinum/Nodes: Coronary, aortic arch, and branch vessel atherosclerotic vascular disease. No pathologic thoracic adenopathy. Lungs/Pleura: Airspace opacity in the left lower lobe and superior segment left lower lobe likely from radiation port, correlate with history of radiation therapy. The original left upper lobe nodule is indistinct and difficult to distinguish from the surrounding radiation pneumonitis. Scattered ground-glass opacities in the vicinity of the presume radiation pneumonitis. Mild volume loss medially in the right upper lobe with some faint ill-defined ground-glass foci along the minor fissure which are likely inflammatory. Minimal subpleural nodularity in the right lower lobe. Mild paraseptal emphysema. Stable subpleural nodularity in the left lower lobe. Significantly reduced conspicuity and size of the lingular nodule, currently 4 mm in diameter on image 33 series 5, previously 0.7 by 0.5 cm. Upper abdomen: Elevated left hemidiaphragm. Musculoskeletal: Lower thoracic fusion, stable. Chronic T12 and L1 compressions.  IMPRESSION: 1. Reduced conspicuity of the anterior left upper lobe nodule, although partially this is due to ex new surrounding radiation pneumonitis. There is some scattered faint subpleural nodularity in both lungs with the, likely inflammatory but meriting observation. 2. The lingular nodule shown on the prior exam is smaller, favoring a benign etiology, but warranting observation. Electronically Signed   By: WVan ClinesM.D.   On: 05/25/2015 11:58    ASSESSMENT AND PLAN: This is a very pleasant 81years old white male recently diagnosed with limited stage small cell lung cancer presented with a 2 left upper lobe pulmonary nodules as well as questionable mediastinal lymphadenopathy.  He underwent systemic chemotherapy with carboplatin and etoposide status post 4 cycles concurrent with radiation was almost complete response.  The recent CT scan of the chest showed no evidence for disease progression. I discussed the scan results with the patient and his wife.  I recommended for him to continue on observation with repeat CT scan of the chest in 3 months. I also referred the patient back to Dr. MTammi Klippelfor consideration and discussion of prophylactic cranial irradiation. He was advised to call immediately if he has any concerning symptoms in the interval. The patient voices understanding of current disease status and treatment options and is in agreement with the current care plan.  All questions were answered. The patient knows to call the clinic with any problems, questions or concerns. We can certainly see the patient much sooner if necessary.  Disclaimer: This note was dictated with voice recognition software. Similar sounding words can inadvertently be transcribed and may not be corrected upon review.

## 2015-05-27 ENCOUNTER — Encounter: Payer: Self-pay | Admitting: Radiation Oncology

## 2015-05-27 NOTE — Progress Notes (Signed)
DIAGNOSIS: Limited stage (T3, N2, M0) small cell lung cancer diagnosed in February 2016 presenting with 2 pulmonary nodules in the left upper lobe as well as questionable mediastinal lymphadenopathy.  PRIOR THERAPY: Systemic chemotherapy with carboplatin for AUC of 5 on day 1 and etoposide 120 MG/M2 on days 1, 2 and 3 with Neulasta support on day 4. First dose on 12/02/2014. He is status post 4 cycles. This is concurrent with radiotherapy under the care of Dr. Tammi Klippel completed on 01/15/2015 and last dose of chemotherapy was on 02/03/2015.  CURRENT THERAPY: Observation. Recent CT scan of the chest showed no evidence for disease progression.  Dr. [name] referred patient back to Dr. Tammi Klippel for consideration and discussion of prophylactic cranial irradiation.  AX: morphine  Radiation treatment dates: 12/01/2014-01/15/2015  Site/dose: The parenchymal lung masses in the left upper lobe and the adenopathy were treated to 59.4 Gy in 33 fractions of 1.8 Gy  Pacemaker: No  Denies any significant chest pain, shortness of breath, cough or hemoptysis. Denies nausea, vomiting, fever or chills. Denies significant weight loss or night sweats. Denies headache, dizziness, ringing in the ears or diplopia.

## 2015-06-01 ENCOUNTER — Ambulatory Visit
Admission: RE | Admit: 2015-06-01 | Discharge: 2015-06-01 | Disposition: A | Discharge status: Home / Self Care | Payer: Medicare Other | Source: Ambulatory Visit | Attending: Radiation Oncology | Admitting: Radiation Oncology

## 2015-06-01 ENCOUNTER — Encounter: Payer: Self-pay | Admitting: Radiation Oncology

## 2015-06-01 ENCOUNTER — Telehealth: Payer: Self-pay | Admitting: *Deleted

## 2015-06-01 VITALS — BP 155/77 | HR 66 | Temp 98.0°F | Resp 16 | Ht 70.0 in | Wt 201.8 lb

## 2015-06-01 DIAGNOSIS — Z510 Encounter for antineoplastic radiation therapy: Secondary | ICD-10-CM | POA: Diagnosis present

## 2015-06-01 DIAGNOSIS — C3412 Malignant neoplasm of upper lobe, left bronchus or lung: Secondary | ICD-10-CM | POA: Insufficient documentation

## 2015-06-01 NOTE — Progress Notes (Signed)
Radiation Oncology         (336) [phone] ________________________________  Name: Abdulmalik Darco MRN: [id number]  Date: 06/01/2015  DOB: [birth year]  Re-Consulation Visit Note  CC: [name], MD  [name], MD  Diagnosis: 81 yo gentleman at risk for brain metastases from small-cell lung cancer     ICD-9-CM ICD-10-CM   1. 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 Primary cancer of left upper lobe of lung (HCC) 162.3 C34.12     Interval Since Last Radiation:  4 months.   12/01/2014-01/15/2015 - The parenchymal lung masses in the left upper lobe and the adenopathy were treated to 59.4 Gy in 33 fractions of 1.8 Gy  Narrative:  The patient returns today for re-consulation. He has completed thoracic radiation and systemic chemotherapy for limited state small-cell lung cancer. Restaging CT on 05/25/15 showed continued improvement with no active intrathoracic disease. He returns today to discuss PCI. The patient presents to the clinic with his wife.  ALLERGIES:  is allergic to morphine and related.  Meds: Current Outpatient Prescriptions  Medication Sig Dispense Refill  . aspirin 325 MG tablet Take 325 mg by mouth daily.    [name] lisinopril (PRINIVIL,ZESTRIL) 5 MG tablet Take 5 mg by mouth daily.    [name] acetaminophen (TYLENOL) 500 MG tablet Take 1 tablet (500 mg total) by mouth every 6 (six) hours as needed for mild pain. (Patient not taking: Reported on 06/01/2015) 30 tablet 0  . Dextromethorphan Polistirex (DELSYM PO) Take by mouth.     No current facility-administered medications for this encounter.    Physical Findings: The patient is in no acute distress. Patient is alert and oriented.  height is '5\' 10"'$  (1.778 m) and weight is 201 lb 12.8 oz (91.536 kg). His oral temperature is 98 F (36.7 C). His blood pressure is 155/77 and his pulse is 66. His respiration is 16 and oxygen saturation is 100%. .  No significant  changes.  Lab Findings: Lab Results  Component Value Date   WBC 6.3 05/25/2015   WBC 8.1 11/19/2014   HGB 12.0* 05/25/2015   HGB 13.5 11/19/2014   HCT 35.8* 05/25/2015   HCT 41.4 11/19/2014   PLT 143 05/25/2015   PLT 231 11/19/2014    Lab Results  Component Value Date   NA 139 05/25/2015   NA 134* 10/25/2014   K 4.9 05/25/2015   K 4.9 10/25/2014   CHLORIDE 107 05/25/2015   CO2 24 05/25/2015   CO2 27 10/25/2014   GLUCOSE 106 05/25/2015   GLUCOSE 110* 10/25/2014   BUN 23.7 05/25/2015   BUN 27* 10/25/2014   CREATININE 1.3 05/25/2015   CREATININE 1.41* 10/25/2014   BILITOT 0.34 05/25/2015   BILITOT 0.5 10/23/2014   ALKPHOS 68 05/25/2015   ALKPHOS 80 10/23/2014   AST 19 05/25/2015   AST 24 10/23/2014   ALT 17 05/25/2015   ALT 32 10/23/2014   PROT 7.1 05/25/2015   PROT 6.7 10/23/2014   ALBUMIN 3.8 05/25/2015   ALBUMIN 3.6 10/23/2014   CALCIUM 9.9 05/25/2015   CALCIUM 10.0 10/25/2014   ANIONGAP 8 05/25/2015   ANIONGAP 6 10/25/2014    Radiographic Findings: Ct Chest W Contrast  05/25/2015  CLINICAL DATA:  Left lung cancer restaging. Chemotherapy last June 2016. Intermittent fatigue. EXAM: CT CHEST WITH CONTRAST TECHNIQUE: Multidetector CT imaging of the chest was performed during intravenous contrast administration. CONTRAST:  34m OMNIPAQUE IOHEXOL 300 MG/ML  SOLN COMPARISON:  02/23/2015 FINDINGS: Mediastinum/Nodes: Coronary, aortic arch, and branch vessel atherosclerotic vascular disease. No pathologic thoracic adenopathy. Lungs/Pleura: Airspace opacity in the left lower lobe and superior segment left lower lobe likely from radiation port, correlate with history of radiation therapy. The original left upper lobe nodule is indistinct and difficult to distinguish from the surrounding radiation pneumonitis. Scattered ground-glass opacities in the vicinity of the presume radiation pneumonitis. Mild volume loss medially in the right upper lobe with some faint ill-defined  ground-glass foci along the minor fissure which are likely inflammatory. Minimal subpleural nodularity in the right lower lobe. Mild paraseptal emphysema. Stable subpleural nodularity in the left lower lobe. Significantly reduced conspicuity and size of the lingular nodule, currently 4 mm in diameter on image 33 series 5, previously 0.7 by 0.5 cm. Upper abdomen: Elevated left hemidiaphragm. Musculoskeletal: Lower thoracic fusion, stable. Chronic T12 and L1 compressions. IMPRESSION: 1. Reduced conspicuity of the anterior left upper lobe nodule, although partially this is due to ex new surrounding radiation pneumonitis. There is some scattered faint subpleural nodularity in both lungs with the, likely inflammatory but meriting observation. 2. The lingular nodule shown on the prior exam is smaller, favoring a benign etiology, but warranting observation. Electronically Signed   By: Van Clines M.D.   On: 05/25/2015 11:58    Impression: 81 yo gentleman at risk for brain metastases from small-cell lung cancer  Plan: Today, I talked to the patient and family about the findings and work-up thus far.  We discussed the natural history of small cell carcinoma and likelihood of brain metastases and general treatment, highlighting the role of radiotherapy in the management.  We discussed how PCI can reduce the incidence of brain mets, improve overall survival, and also help preserve neurologic function.  We discussed the available radiation techniques, and focused on the details of logistics and delivery.  We reviewed the anticipated acute and late sequelae associated with radiation in this setting.  The patient was encouraged to ask questions that I answered to the best of my ability.  I filled out a patient counseling form during our discussion including treatment diagrams.    I will schedule a brain MRI for re-staging purposes. I will also schedule CT simulation and discuss results at that time, with the option  of proceed with whole brain irradiation if the patient is agreeable.  This document serves as a record of services personally performed by Tyler Pita, MD. It was created on his behalf by Darcus Austin, a trained medical scribe. The creation of this record is based on the scribe's personal observations and the provider's statements to them. This document has been checked and approved by the attending provider.     _____________________________________  Sheral Apley. Tammi Klippel, M.D.

## 2015-06-01 NOTE — Addendum Note (Signed)
Encounter addended by: Heywood Footman, RN on: 06/01/2015  2:29 PM<BR>     Documentation filed: [employer], Inpatient Patient Education, Orders

## 2015-06-01 NOTE — Telephone Encounter (Signed)
CALLED PATIENT TO INFORM OF MRI AND SIM, SPOKE WITH PATIENT'S WIFE- Travis Medina AND SHE IS AWARE OF THESE APPTS.

## 2015-06-01 NOTE — Progress Notes (Signed)
See progress note under physician encounter. 

## 2015-06-08 ENCOUNTER — Ambulatory Visit (HOSPITAL_COMMUNITY)
Admission: RE | Admit: 2015-06-08 | Discharge: 2015-06-08 | Disposition: A | Discharge status: Home / Self Care | Payer: Medicare Other | Source: Ambulatory Visit | Attending: Radiation Oncology | Admitting: Radiation Oncology

## 2015-06-08 ENCOUNTER — Telehealth: Payer: Self-pay | Admitting: Radiation Oncology

## 2015-06-08 DIAGNOSIS — C3412 Malignant neoplasm of upper lobe, left bronchus or lung: Secondary | ICD-10-CM | POA: Diagnosis not present

## 2015-06-08 DIAGNOSIS — I739 Peripheral vascular disease, unspecified: Secondary | ICD-10-CM | POA: Insufficient documentation

## 2015-06-08 MED ORDER — GADOBENATE DIMEGLUMINE 529 MG/ML IV SOLN
20.0000 mL | Freq: Once | INTRAVENOUS | Status: AC | PRN
  Administered 2015-06-08: 19 mL via INTRAVENOUS

## 2015-06-08 NOTE — Telephone Encounter (Signed)
-----   Message from Jacqulyn Liner, RN sent at 06/08/2015  4:43 PM EDT -----   ----- Message -----    From: Tyler Pita, MD    Sent: 06/08/2015   4:39 PM      To: [facility] Radiation Nurse  Please call patient with normal result.  MRI shows no metastases which is good, so we can proceed with low dose prophylactic cranial irradiation (PCI) as planned.  Thanks. MM

## 2015-06-08 NOTE — Progress Notes (Signed)
Quick Note:  Please call patient with normal result. MRI shows no metastases which is good, so we can proceed with low dose prophylactic cranial irradiation (PCI) as planned.  Thanks. MM ______

## 2015-06-08 NOTE — Telephone Encounter (Signed)
Phoned patient at home. Spoke with wife. Explained MRI was normal and did not show metastases so we can proceed with low dose cranial irradiation as planned. She verbalized understanding and expressed appreciation for the call.

## 2015-06-12 ENCOUNTER — Ambulatory Visit: Payer: Medicare Other | Admitting: Radiation Oncology

## 2015-06-18 ENCOUNTER — Ambulatory Visit
Admission: RE | Admit: 2015-06-18 | Discharge: 2015-06-18 | Disposition: A | Discharge status: Home / Self Care | Payer: Medicare Other | Source: Ambulatory Visit | Attending: Radiation Oncology | Admitting: Radiation Oncology

## 2015-06-18 DIAGNOSIS — Z510 Encounter for antineoplastic radiation therapy: Secondary | ICD-10-CM | POA: Diagnosis not present

## 2015-06-18 DIAGNOSIS — C3412 Malignant neoplasm of upper lobe, left bronchus or lung: Secondary | ICD-10-CM

## 2015-06-18 NOTE — Progress Notes (Signed)
  Radiation Oncology         [phone] ________________________________  Name: Travis Medina MRN: [id number]  Date: 06/18/2015  DOB: [birth year]  SIMULATION AND TREATMENT PLANNING NOTE    ICD-9-CM ICD-10-CM   1. Primary cancer of left upper lobe of lung (HCC) 162.3 C34.12     DIAGNOSIS:  81 yo gentleman at risk for brain metastases from small-cell lung cancer   NARRATIVE:  The patient was brought to the [place].  Identity was confirmed.  All relevant records and images related to the planned course of therapy were reviewed.  The patient freely provided informed written consent to proceed with treatment after reviewing the details related to the planned course of therapy. The consent form was witnessed and verified by the simulation staff.  Then, the patient was set-up in a stable reproducible  supine position for radiation therapy.  CT images were obtained.  Surface markings were placed.  The CT images were loaded into the planning software.  Then the target and avoidance structures were contoured.  Treatment planning then occurred.  The radiation prescription was entered and confirmed.  Then, I designed and supervised the construction of a total of 3 medically necessary complex treatment devices, including a custom made thermoplastic mask used for immobilization and two complex multileaf collimators to cover the entire intracranial contents, while shielding the eyes and face.  Each [facility] is independently created to account for beam divergence.  The right and left lateral fields will be treated with 6 MV X-rays.  I have requested : Isodose Plan.    PLAN:  The whole brain will be treated to 25 Gy in 10 fractions.  ________________________________  Sheral Apley Tammi Klippel, M.D.  This document serves as a record of services personally performed by Tyler Pita, MD. It was created on his behalf by Arlyce Harman, a trained medical scribe. The creation of this record is based  on the scribe's personal observations and the provider's statements to them. This document has been checked and approved by the attending provider.

## 2015-06-24 DIAGNOSIS — Z510 Encounter for antineoplastic radiation therapy: Secondary | ICD-10-CM | POA: Diagnosis not present

## 2015-06-25 ENCOUNTER — Ambulatory Visit
Admission: RE | Admit: 2015-06-25 | Discharge: 2015-06-25 | Disposition: A | Discharge status: Home / Self Care | Payer: Medicare Other | Source: Ambulatory Visit | Attending: Radiation Oncology | Admitting: Radiation Oncology

## 2015-06-25 DIAGNOSIS — Z510 Encounter for antineoplastic radiation therapy: Secondary | ICD-10-CM | POA: Diagnosis not present

## 2015-06-29 ENCOUNTER — Ambulatory Visit
Admission: RE | Admit: 2015-06-29 | Discharge: 2015-06-29 | Disposition: A | Discharge status: Home / Self Care | Payer: Medicare Other | Source: Ambulatory Visit | Attending: Radiation Oncology | Admitting: Radiation Oncology

## 2015-06-29 DIAGNOSIS — Z510 Encounter for antineoplastic radiation therapy: Secondary | ICD-10-CM | POA: Diagnosis not present

## 2015-06-30 ENCOUNTER — Ambulatory Visit
Admission: RE | Admit: 2015-06-30 | Discharge: 2015-06-30 | Disposition: A | Discharge status: Home / Self Care | Payer: Medicare Other | Source: Ambulatory Visit | Attending: Radiation Oncology | Admitting: Radiation Oncology

## 2015-06-30 ENCOUNTER — Encounter: Payer: Self-pay | Admitting: Radiation Oncology

## 2015-06-30 VITALS — BP 145/73 | HR 86 | Resp 16 | Wt 198.4 lb

## 2015-06-30 DIAGNOSIS — Z510 Encounter for antineoplastic radiation therapy: Secondary | ICD-10-CM | POA: Insufficient documentation

## 2015-06-30 DIAGNOSIS — C3412 Malignant neoplasm of upper lobe, left bronchus or lung: Secondary | ICD-10-CM | POA: Diagnosis not present

## 2015-06-30 MED ORDER — SONAFINE EX EMUL
1.0000 "application " | Freq: Once | CUTANEOUS | Status: AC
  Administered 2015-06-30: 1 via TOPICAL
  Filled 2015-06-30: qty 45

## 2015-06-30 NOTE — Addendum Note (Signed)
Encounter addended by: Neysa Hotter, [facility] on: 06/30/2015 10:41 AM<BR>     Documentation filed: Rx Order Verification

## 2015-06-30 NOTE — Progress Notes (Signed)
   Department of Radiation Oncology  Phone:  [phone] Fax:        [phone]  Weekly Treatment Note    Name: Travis Medina Date: 06/30/2015 MRN: [id number] DOB: [birth year]   Current dose: 5 Gy  Current fraction: 2   MEDICATIONS: Current Outpatient Prescriptions  Medication Sig Dispense Refill  . acetaminophen (TYLENOL) 500 MG tablet Take 1 tablet (500 mg total) by mouth every 6 (six) hours as needed for mild pain. 30 tablet 0  . alendronate (FOSAMAX) 70 MG tablet     . aspirin 325 MG tablet Take 325 mg by mouth daily.    [name] Dextromethorphan Polistirex (DELSYM PO) Take by mouth.    [name] lisinopril (PRINIVIL,ZESTRIL) 5 MG tablet Take 5 mg by mouth daily.    . Wound Dressings (SONAFINE EX) Apply topically.     No current facility-administered medications for this encounter.     ALLERGIES: Morphine and related   LABORATORY DATA:  Lab Results  Component Value Date   WBC 6.3 05/25/2015   HGB 12.0* 05/25/2015   HCT 35.8* 05/25/2015   MCV 97.8 05/25/2015   PLT 143 05/25/2015   Lab Results  Component Value Date   NA 139 05/25/2015   K 4.9 05/25/2015   CL 101 10/25/2014   CO2 24 05/25/2015   Lab Results  Component Value Date   ALT 17 05/25/2015   AST 19 05/25/2015   ALKPHOS 68 05/25/2015   BILITOT 0.34 05/25/2015     NARRATIVE: Travis Medina was seen today for weekly treatment management. The chart was checked and the patient's films were reviewed.  Weight and vitals stable. Denies pain. Denies headache, dizziness, nausea, vomiting, diplopia or ringing in the ears. Provided patient with Sonafine and directed upon use. Reoriented patient to staff and routine of the clinic. Encouraged patient to contact this RN with future needs. Educated patient reference potential side effects such as headache, fatigue, hair loss, and skin changes. Patient verbalized understanding of all reviewed.   BP 145/73 mmHg  Pulse 86  Resp 16  Wt 198 lb 6.4 oz (89.994 kg) Wt  Readings from Last 3 Encounters:  06/30/15 198 lb 6.4 oz (89.994 kg)  06/01/15 201 lb 12.8 oz (91.536 kg)  05/26/15 201 lb (91.173 kg)     PHYSICAL EXAMINATION: weight is 198 lb 6.4 oz (89.994 kg). His blood pressure is 145/73 and his pulse is 86. His respiration is 16.        ASSESSMENT: The patient is doing satisfactorily with treatment.  PLAN: We will continue with the patient's radiation treatment as planned. The patient is doing excellent in his first week of treatment.

## 2015-06-30 NOTE — Addendum Note (Signed)
Encounter addended by: Kyung Rudd, MD on: 06/30/2015  5:53 PM<BR>     Documentation filed: Notes Section

## 2015-06-30 NOTE — Progress Notes (Addendum)
Weight and vitals stable. Denies pain. Denies headache, dizziness, nausea, vomiting, diplopia or ringing in the ears. Provided patient with Sonafine and directed upon use. Reoriented patient to staff and routine of the clinic. Encouraged patient to contact this RN with future needs. Educated patient reference potential side effects such as headache, fatigue, hair loss, and skin changes. Patient verbalized understanding of all reviewed.   BP 145/73 mmHg  Pulse 86  Resp 16  Wt 198 lb 6.4 oz (89.994 kg) Wt Readings from Last 3 Encounters:  06/30/15 198 lb 6.4 oz (89.994 kg)  06/01/15 201 lb 12.8 oz (91.536 kg)  05/26/15 201 lb (91.173 kg)

## 2015-07-01 ENCOUNTER — Ambulatory Visit
Admission: RE | Admit: 2015-07-01 | Discharge: 2015-07-01 | Disposition: A | Discharge status: Home / Self Care | Payer: Medicare Other | Source: Ambulatory Visit | Attending: Radiation Oncology | Admitting: Radiation Oncology

## 2015-07-01 DIAGNOSIS — Z510 Encounter for antineoplastic radiation therapy: Secondary | ICD-10-CM | POA: Diagnosis not present

## 2015-07-06 ENCOUNTER — Ambulatory Visit
Admission: RE | Admit: 2015-07-06 | Discharge: 2015-07-06 | Disposition: A | Discharge status: Home / Self Care | Payer: Medicare Other | Source: Ambulatory Visit | Attending: Radiation Oncology | Admitting: Radiation Oncology

## 2015-07-06 DIAGNOSIS — Z510 Encounter for antineoplastic radiation therapy: Secondary | ICD-10-CM | POA: Diagnosis not present

## 2015-07-07 ENCOUNTER — Ambulatory Visit
Admission: RE | Admit: 2015-07-07 | Discharge: 2015-07-07 | Disposition: A | Discharge status: Home / Self Care | Payer: Medicare Other | Source: Ambulatory Visit | Attending: Radiation Oncology | Admitting: Radiation Oncology

## 2015-07-07 DIAGNOSIS — Z510 Encounter for antineoplastic radiation therapy: Secondary | ICD-10-CM | POA: Diagnosis not present

## 2015-07-08 ENCOUNTER — Ambulatory Visit
Admission: RE | Admit: 2015-07-08 | Discharge: 2015-07-08 | Disposition: A | Discharge status: Home / Self Care | Payer: Medicare Other | Source: Ambulatory Visit | Attending: Radiation Oncology | Admitting: Radiation Oncology

## 2015-07-08 DIAGNOSIS — Z510 Encounter for antineoplastic radiation therapy: Secondary | ICD-10-CM | POA: Diagnosis not present

## 2015-07-09 ENCOUNTER — Ambulatory Visit
Admission: RE | Admit: 2015-07-09 | Discharge: 2015-07-09 | Disposition: A | Discharge status: Home / Self Care | Payer: Medicare Other | Source: Ambulatory Visit | Attending: Radiation Oncology | Admitting: Radiation Oncology

## 2015-07-09 DIAGNOSIS — Z510 Encounter for antineoplastic radiation therapy: Secondary | ICD-10-CM | POA: Diagnosis not present

## 2015-07-10 ENCOUNTER — Ambulatory Visit
Admission: RE | Admit: 2015-07-10 | Discharge: 2015-07-10 | Disposition: A | Discharge status: Home / Self Care | Payer: Medicare Other | Source: Ambulatory Visit | Attending: Radiation Oncology | Admitting: Radiation Oncology

## 2015-07-10 ENCOUNTER — Encounter: Payer: Self-pay | Admitting: Radiation Oncology

## 2015-07-10 VITALS — BP 134/67 | HR 70 | Resp 16 | Wt 201.3 lb

## 2015-07-10 DIAGNOSIS — Z510 Encounter for antineoplastic radiation therapy: Secondary | ICD-10-CM | POA: Diagnosis not present

## 2015-07-10 DIAGNOSIS — C3412 Malignant neoplasm of upper lobe, left bronchus or lung: Secondary | ICD-10-CM

## 2015-07-10 DIAGNOSIS — R918 Other nonspecific abnormal finding of lung field: Secondary | ICD-10-CM

## 2015-07-10 MED ORDER — SONAFINE EX EMUL
1.0000 "application " | Freq: Once | CUTANEOUS | Status: AC
  Administered 2015-07-10: 1 via TOPICAL
  Filled 2015-07-10: qty 45

## 2015-07-10 NOTE — Progress Notes (Signed)
Weight and vitals stable. Denies pain. Reports most mornings he awakes with a mild headache that quickly resolves without intervention. Reports on most days in the even a sudden feeling of sleepiness will overcome him despite getting 8-9 hours of sleep per night. Denies dizziness, nausea, vomiting, diplopia or ringing in the ears. No hyperpigmentation or desquamation of scalp noted. Reports using Sonafine as directed. Encouraged to continue Sonafine bid for two weeks s/p completion. One month follow up appointment card given. Patient understands to contact this RN with future needs.   BP 134/67 mmHg  Pulse 70  Resp 16  Wt 201 lb 4.8 oz (91.309 kg)  SpO2 100% Wt Readings from Last 3 Encounters:  07/10/15 201 lb 4.8 oz (91.309 kg)  06/30/15 198 lb 6.4 oz (89.994 kg)  06/01/15 201 lb 12.8 oz (91.536 kg)

## 2015-07-10 NOTE — Progress Notes (Signed)
  Radiation Oncology         [phone]   Name: Travis Medina MRN: [id number]   Date: 07/10/2015  DOB: 06-13-1934     Weekly Radiation Therapy Management    ICD-9-CM ICD-10-CM   1. Lung mass 786.6 R91.8 SONAFINE emulsion 1 application  2. Primary cancer of left upper lobe of lung (HCC) 162.3 C34.12     Current Dose: 20 Gy  Planned Dose:  25 Gy  Narrative The patient presents for routine under treatment assessment.  Weight and vitals stable. Denies pain. Reports most mornings he awakes with a mild headache that quickly resolves without intervention. Reports on most days in the even a sudden feeling of sleepiness will overcome him despite getting 8-9 hours of sleep per night. Denies dizziness, nausea, vomiting, diplopia or ringing in the ears. No hyperpigmentation or desquamation of scalp noted. Denies hair loss. Reports using Sonafine as directed. Encouraged to continue Sonafine bid for two weeks s/p completion. One month follow up appointment card given.    The patient is without complaint. Set-up films were reviewed. The chart was checked.  Physical Findings  weight is 201 lb 4.8 oz (91.309 kg). His blood pressure is 134/67 and his pulse is 70. His respiration is 16 and oxygen saturation is 100%. . Weight essentially stable.  No significant changes.  Impression The patient is tolerating radiation.  Plan Continue treatment as planned. Patient is scheduled to finish treatment next Tuesday and follow up in one month.         Sheral Apley Tammi Klippel, M.D.  This document serves as a record of services personally performed by Tyler Pita, MD. It was created on his behalf by Arlyce Harman, a trained medical scribe. The creation of this record is based on the scribe's personal observations and the provider's statements to them. This document has been checked and approved by the attending provider.

## 2015-07-13 ENCOUNTER — Ambulatory Visit
Admission: RE | Admit: 2015-07-13 | Discharge: 2015-07-13 | Disposition: A | Discharge status: Home / Self Care | Payer: Medicare Other | Source: Ambulatory Visit | Attending: Radiation Oncology | Admitting: Radiation Oncology

## 2015-07-13 DIAGNOSIS — Z510 Encounter for antineoplastic radiation therapy: Secondary | ICD-10-CM | POA: Diagnosis not present

## 2015-07-14 ENCOUNTER — Ambulatory Visit
Admission: RE | Admit: 2015-07-14 | Discharge: 2015-07-14 | Disposition: A | Discharge status: Home / Self Care | Payer: Medicare Other | Source: Ambulatory Visit | Attending: Radiation Oncology | Admitting: Radiation Oncology

## 2015-07-14 ENCOUNTER — Encounter: Payer: Self-pay | Admitting: Radiation Oncology

## 2015-07-14 DIAGNOSIS — Z510 Encounter for antineoplastic radiation therapy: Secondary | ICD-10-CM | POA: Diagnosis not present

## 2015-07-21 ENCOUNTER — Encounter: Payer: Self-pay | Admitting: Internal Medicine

## 2015-07-23 ENCOUNTER — Ambulatory Visit (HOSPITAL_COMMUNITY): Payer: Medicare Other

## 2015-07-29 NOTE — Progress Notes (Signed)
  Radiation Oncology         [phone] ________________________________  Name: Micahel Omlor MRN: [id number]  Date: 07/14/2015  DOB: 1933-08-28  End of Treatment Note  Diagnosis:   81 yo gentleman at risk for brain metastases from small-cell lung cancer      Indication for treatment:  Palliation       Radiation treatment dates:   06/25/15-07/14/15  Site/dose:   The whole brain was treated to 25 Gy in 10 fractions of 2.5 Gy  Beams/energy:   Right and Left radiation fields were treated using 6 MV X-rays with custom MLC collimation to shield the eyes and face.  The patient was immobilized with a thermoplastic mask and isocenter was verified with weekly port films.  Narrative: The patient tolerated radiation treatment relatively well.    Reported most mornings he awoke with a mild headache that quickly resolved without intervention. Reported on most days in the evening a sudden feeling of sleepiness would overcome him despite getting 8-9 hours of sleep per night. Denied dizziness, nausea, vomiting, diplopia or ringing in the ears. No hyperpigmentation or desquamation of scalp noted. Denied hair loss. Reported using Sonafine as directed. Encouraged to continue Sonafine bid for two weeks s/p completion.  Plan: The patient has completed radiation treatment. The patient will return to radiation oncology clinic for routine followup in one month. I advised them to call or return sooner if they have any questions or concerns related to their recovery or treatment. ________________________________  Sheral Apley. Tammi Klippel, M.D.

## 2015-08-13 ENCOUNTER — Encounter: Payer: Self-pay | Admitting: Radiation Oncology

## 2015-08-13 ENCOUNTER — Ambulatory Visit
Admission: RE | Admit: 2015-08-13 | Discharge: 2015-08-13 | Disposition: A | Discharge status: Home / Self Care | Payer: Medicare Other | Source: Ambulatory Visit | Attending: Radiation Oncology | Admitting: Radiation Oncology

## 2015-08-13 VITALS — BP 135/64 | HR 64 | Resp 18 | Wt 196.9 lb

## 2015-08-13 DIAGNOSIS — C3412 Malignant neoplasm of upper lobe, left bronchus or lung: Secondary | ICD-10-CM

## 2015-08-13 NOTE — Progress Notes (Signed)
Radiation Oncology         (336) [phone] ________________________________  Name: Travis Medina MRN: [id number]  Date: 08/13/2015  DOB: 03/09/1934  Follow-Up Visit Note  CC: [name], MD  [name], MD   Diagnosis:   82 yo gentleman at risk for brain metastases from small-cell lung cancer.    ICD-9-CM ICD-10-CM   1. Primary cancer of left upper lobe of lung (HCC) 162.3 C34.12     Interval Since Last Radiation:  1  months.  Completed radiation 06/25/2015 - 07/14/2015 to the whole brain with 25 Gy in 10 fractions of 2.5 Gy.  Narrative:  The patient returns today for routine follow-up.  Weight and vitals stable. Denies pain. Reports a productive cough only in the morning with clear sputum from "sinuses." Denies SOB. Denies pain or difficulty associated with swallowing. Denies headache, dizziness, nausea, vomiting, diplopia or ringing in the ears. Wife reports the patient's short term memory has improved. Patient reports his energy level has improved. 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  ALLERGIES:  is allergic to morphine and related.  Meds: Current Outpatient Prescriptions  Medication Sig Dispense Refill  . aspirin 325 MG tablet Take 325 mg by mouth daily.    [name] lisinopril (PRINIVIL,ZESTRIL) 10 MG tablet     . acetaminophen (TYLENOL) 500 MG tablet Take 1 tablet (500 mg total) by mouth every 6 (six) hours as needed for mild pain. (Patient not taking: Reported on 08/13/2015) 30 tablet 0  . alendronate (FOSAMAX) 70 MG tablet Reported on 08/13/2015    . Dextromethorphan Polistirex (DELSYM PO) Take by mouth. Reported on 08/13/2015    . lisinopril (PRINIVIL,ZESTRIL) 5 MG tablet Take 5 mg by mouth daily. Reported on 08/13/2015    . Wound Dressings (SONAFINE EX) Apply topically. Reported on 08/13/2015     No current facility-administered medications for this encounter.    Physical Findings: The patient is in no acute  distress. Patient is alert and oriented.  weight is 196 lb 14.4 oz (89.313 kg). His blood pressure is 135/64 and his pulse is 64. His respiration is 18 and oxygen saturation is 97%. .  No significant changes.  Lab Findings: Lab Results  Component Value Date   WBC 6.3 05/25/2015   WBC 8.1 11/19/2014   HGB 12.0* 05/25/2015   HGB 13.5 11/19/2014   HCT 35.8* 05/25/2015   HCT 41.4 11/19/2014   PLT 143 05/25/2015   PLT 231 11/19/2014    Lab Results  Component Value Date   NA 139 05/25/2015   NA 134* 10/25/2014   K 4.9 05/25/2015   K 4.9 10/25/2014   CHLORIDE 107 05/25/2015   CO2 24 05/25/2015   CO2 27 10/25/2014   GLUCOSE 106 05/25/2015   GLUCOSE 110* 10/25/2014   BUN 23.7 05/25/2015   BUN 27* 10/25/2014   CREATININE 1.3 05/25/2015   CREATININE 1.41* 10/25/2014   BILITOT 0.34 05/25/2015   BILITOT 0.5 10/23/2014   ALKPHOS 68 05/25/2015   ALKPHOS 80 10/23/2014   AST 19 05/25/2015   AST 24 10/23/2014   ALT 17 05/25/2015   ALT 32 10/23/2014   PROT 7.1 05/25/2015   PROT 6.7 10/23/2014   ALBUMIN 3.8 05/25/2015   ALBUMIN 3.6 10/23/2014   CALCIUM 9.9 05/25/2015   CALCIUM 10.0 10/25/2014   ANIONGAP 8 05/25/2015   ANIONGAP 6 10/25/2014    Radiographic Findings: No results found.  Impression:  The patient is  recovering from the effects of radiation.  Plan:  He is scheduled for a CT chest 1/17 and a follow up with Dr. [name] on 1/23. If he develops symptoms relating to his brain, we will do another CT scan. If he is doing well, he will not need to follow up with me. He knows he can call me if he has any problems or questions.   _____________________________________  Sheral Apley Tammi Klippel, M.D.   This document serves as a record of services personally performed by Tyler Pita, MD. It was created on his behalf by Lendon Collar, a trained medical scribe. The creation of this record is based on the scribe's personal observations and the provider's statements to them. This  document has been checked and approved by the attending provider.

## 2015-08-13 NOTE — Progress Notes (Signed)
Weight and vitals stable. Denies pain. Reports a productive cough only in the morning with clear sputum from "sinuses." Denies SOB. Denies pain or difficulty associated with swallowing. Denies headache, dizziness, nausea, vomiting, diplopia or ringing in the ears. Wife reports the patient's short term memory has improved. Patient reports his energy level has improved. 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 Scheduled for CT chest 1/17 and follow up with [facility] on 1/23.  BP 135/64 mmHg  Pulse 64  Resp 18  Wt 196 lb 14.4 oz (89.313 kg)  SpO2 97% Wt Readings from Last 3 Encounters:  08/13/15 196 lb 14.4 oz (89.313 kg)  07/10/15 201 lb 4.8 oz (91.309 kg)  06/30/15 198 lb 6.4 oz (89.994 kg)

## 2015-08-25 ENCOUNTER — Other Ambulatory Visit (HOSPITAL_BASED_OUTPATIENT_CLINIC_OR_DEPARTMENT_OTHER): Payer: Medicare Other

## 2015-08-25 ENCOUNTER — Encounter (HOSPITAL_COMMUNITY): Payer: Self-pay

## 2015-08-25 ENCOUNTER — Ambulatory Visit (HOSPITAL_COMMUNITY)
Admission: RE | Admit: 2015-08-25 | Discharge: 2015-08-25 | Disposition: A | Discharge status: Home / Self Care | Payer: Medicare Other | Source: Ambulatory Visit | Attending: Internal Medicine | Admitting: Internal Medicine

## 2015-08-25 ENCOUNTER — Other Ambulatory Visit: Payer: Self-pay | Admitting: Medical Oncology

## 2015-08-25 DIAGNOSIS — K449 Diaphragmatic hernia without obstruction or gangrene: Secondary | ICD-10-CM | POA: Diagnosis not present

## 2015-08-25 DIAGNOSIS — J700 Acute pulmonary manifestations due to radiation: Secondary | ICD-10-CM | POA: Diagnosis not present

## 2015-08-25 DIAGNOSIS — C3412 Malignant neoplasm of upper lobe, left bronchus or lung: Secondary | ICD-10-CM

## 2015-08-25 DIAGNOSIS — R911 Solitary pulmonary nodule: Secondary | ICD-10-CM | POA: Diagnosis not present

## 2015-08-25 DIAGNOSIS — I700 Atherosclerosis of aorta: Secondary | ICD-10-CM | POA: Insufficient documentation

## 2015-08-25 DIAGNOSIS — J9811 Atelectasis: Secondary | ICD-10-CM | POA: Insufficient documentation

## 2015-08-25 DIAGNOSIS — M47814 Spondylosis without myelopathy or radiculopathy, thoracic region: Secondary | ICD-10-CM | POA: Diagnosis not present

## 2015-08-25 DIAGNOSIS — Z08 Encounter for follow-up examination after completed treatment for malignant neoplasm: Secondary | ICD-10-CM | POA: Insufficient documentation

## 2015-08-25 DIAGNOSIS — I2510 Atherosclerotic heart disease of native coronary artery without angina pectoris: Secondary | ICD-10-CM | POA: Insufficient documentation

## 2015-08-25 DIAGNOSIS — Z9221 Personal history of antineoplastic chemotherapy: Secondary | ICD-10-CM | POA: Insufficient documentation

## 2015-08-25 DIAGNOSIS — Z85118 Personal history of other malignant neoplasm of bronchus and lung: Secondary | ICD-10-CM | POA: Insufficient documentation

## 2015-08-25 LAB — COMPREHENSIVE METABOLIC PANEL
ALBUMIN: 4.1 g/dL (ref 3.5–5.0)
ALK PHOS: 60 U/L (ref 40–150)
ALT: 18 U/L (ref 0–55)
ANION GAP: 9 meq/L (ref 3–11)
AST: 17 U/L (ref 5–34)
BUN: 23 mg/dL (ref 7.0–26.0)
CALCIUM: 9.8 mg/dL (ref 8.4–10.4)
CO2: 24 mEq/L (ref 22–29)
Chloride: 105 mEq/L (ref 98–109)
Creatinine: 1.4 mg/dL — ABNORMAL HIGH (ref 0.7–1.3)
EGFR: 46 mL/min/{1.73_m2} — ABNORMAL LOW (ref >90)
Glucose: 113 mg/dl (ref 70–140)
POTASSIUM: 5.2 meq/L — AB (ref 3.5–5.1)
Sodium: 138 mEq/L (ref 136–145)
Total Bilirubin: 0.81 mg/dL (ref 0.20–1.20)
Total Protein: 7.3 g/dL (ref 6.4–8.3)

## 2015-08-25 LAB — CBC WITH DIFFERENTIAL/PLATELET
BASO%: 0.4 % (ref 0.0–2.0)
Basophils Absolute: 0 10*3/uL (ref 0.0–0.1)
EOS%: 2.1 % (ref 0.0–7.0)
Eosinophils Absolute: 0.1 10*3/uL (ref 0.0–0.5)
HCT: 37 % — ABNORMAL LOW (ref 38.4–49.9)
HGB: 12.2 g/dL — ABNORMAL LOW (ref 13.0–17.1)
LYMPH%: 12.3 % — ABNORMAL LOW (ref 14.0–49.0)
MCH: 32.7 pg (ref 27.2–33.4)
MCHC: 32.9 g/dL (ref 32.0–36.0)
MCV: 99.2 fL — ABNORMAL HIGH (ref 79.3–98.0)
MONO#: 0.6 10*3/uL (ref 0.1–0.9)
MONO%: 8.8 % (ref 0.0–14.0)
NEUT%: 76.4 % — ABNORMAL HIGH (ref 39.0–75.0)
NEUTROS ABS: 5.3 10*3/uL (ref 1.5–6.5)
PLATELETS: 162 10*3/uL (ref 140–400)
RBC: 3.73 10*6/uL — AB (ref 4.20–5.82)
RDW: 13.3 % (ref 11.0–14.6)
WBC: 7 10*3/uL (ref 4.0–10.3)
lymph#: 0.9 10*3/uL (ref 0.9–3.3)

## 2015-08-25 MED ORDER — IOHEXOL 300 MG/ML  SOLN
75.0000 mL | Freq: Once | INTRAMUSCULAR | Status: AC | PRN
  Administered 2015-08-25: 75 mL via INTRAVENOUS

## 2015-08-31 ENCOUNTER — Telehealth: Payer: Self-pay | Admitting: Internal Medicine

## 2015-08-31 ENCOUNTER — Ambulatory Visit (HOSPITAL_BASED_OUTPATIENT_CLINIC_OR_DEPARTMENT_OTHER): Payer: Medicare Other | Admitting: Internal Medicine

## 2015-08-31 ENCOUNTER — Encounter: Payer: Self-pay | Admitting: Internal Medicine

## 2015-08-31 VITALS — BP 138/69 | HR 99 | Temp 98.5°F | Resp 18 | Wt 189.8 lb

## 2015-08-31 DIAGNOSIS — C3412 Malignant neoplasm of upper lobe, left bronchus or lung: Secondary | ICD-10-CM

## 2015-08-31 NOTE — Progress Notes (Signed)
[name] Telephone:([phone]) [phone]   Fax:(336) [phone]  OFFICE PROGRESS NOTE  [name], MD [place] [address]  DIAGNOSIS: Limited stage (T3, N2, M0) small cell lung cancer diagnosed in February 2016 presenting with 2 pulmonary nodules in the left upper lobe as well as questionable mediastinal lymphadenopathy.  PRIOR THERAPY:  1)Systemic chemotherapy with carboplatin for AUC of 5 on day 1 and etoposide 120 MG/M2 on days 1, 2 and 3 with Neulasta support on day 4. First dose on 12/02/2014. He is status post 4 cycles. This is concurrent with radiotherapy under the care of Dr. Tammi Klippel completed on 01/15/2015 and last dose of chemotherapy was on 02/03/2015. 2) whole brain irradiation under the care of Dr. Tammi Klippel completed on 07/14/2015.  CURRENT THERAPY: Observation.  INTERVAL HISTORY: Travis Medina 82 y.o. male returns to the clinic today for follow-up visit accompanied by his wife. The patient is feeling fine today with no specific complaints except for fatigue and he takes some naps every day. He has been observation. He denied having any significant chest pain, shortness of breath, cough or hemoptysis. The patient denied having any significant nausea or vomiting, no fever or chills. He has no significant weight loss or night sweats. He had repeat CT scan of the chest performed recently and he is here for evaluation and discussion of his scan results.  MEDICAL HISTORY: Past Medical History  Diagnosis Date  . Essential hypertension   . Lung cancer ([place])   . S/P chemotherapy, time since 4-12 weeks   . S/P radiation therapy     ALLERGIES:  is allergic to morphine and related.  MEDICATIONS:  Current Outpatient Prescriptions  Medication Sig Dispense Refill  . acetaminophen (TYLENOL) 500 MG tablet Take 1 tablet (500 mg total) by mouth every 6 (six) hours as needed for mild pain. (Patient not taking: Reported on 08/13/2015) 30 tablet 0  .  alendronate (FOSAMAX) 70 MG tablet Reported on 08/13/2015    . aspirin 325 MG tablet Take 325 mg by mouth daily.    [name] Dextromethorphan Polistirex (DELSYM PO) Take by mouth. Reported on 08/13/2015    . lisinopril (PRINIVIL,ZESTRIL) 10 MG tablet     . lisinopril (PRINIVIL,ZESTRIL) 5 MG tablet Take 5 mg by mouth daily. Reported on 08/13/2015    . Wound Dressings (SONAFINE EX) Apply topically. Reported on 08/13/2015     No current facility-administered medications for this visit.    SURGICAL HISTORY:  Past Surgical History  Procedure Laterality Date  . Total knee arthroplasty      bilateral  . Lung biopsy      REVIEW OF SYSTEMS:  A comprehensive review of systems was negative except for: Constitutional: positive for fatigue   PHYSICAL EXAMINATION: General appearance: alert, cooperative and no distress Head: Normocephalic, without obvious abnormality, atraumatic Neck: no adenopathy, no JVD, supple, symmetrical, trachea midline and thyroid not enlarged, symmetric, no tenderness/mass/nodules Lymph nodes: Cervical, supraclavicular, and axillary nodes normal. Resp: clear to auscultation bilaterally Back: symmetric, no curvature. ROM normal. No CVA tenderness. Cardio: regular rate and rhythm, S1, S2 normal, no murmur, click, rub or gallop GI: soft, non-tender; bowel sounds normal; no masses,  no organomegaly Extremities: extremities normal, atraumatic, no cyanosis or edema Neurologic: Alert and oriented X 3, normal strength and tone. Normal symmetric reflexes. Normal coordination and gait  ECOG PERFORMANCE STATUS: 1 - Symptomatic but completely ambulatory  Blood pressure 138/69, pulse 99, temperature 98.5 F (36.9 C), temperature source Oral,  resp. rate 18, weight 189 lb 12.8 oz (86.093 kg), SpO2 99 %.  LABORATORY DATA: Lab Results  Component Value Date   WBC 7.0 08/25/2015   HGB 12.2* 08/25/2015   HCT 37.0* 08/25/2015   MCV 99.2* 08/25/2015   PLT 162 08/25/2015      Chemistry        Component Value Date/Time   NA 138 08/25/2015 0835   NA 134* 10/25/2014 0551   K 5.2* 08/25/2015 0835   K 4.9 10/25/2014 0551   CL 101 10/25/2014 0551   CO2 24 08/25/2015 0835   CO2 27 10/25/2014 0551   BUN 23.0 08/25/2015 0835   BUN 27* 10/25/2014 0551   CREATININE 1.4* 08/25/2015 0835   CREATININE 1.41* 10/25/2014 0551      Component Value Date/Time   CALCIUM 9.8 08/25/2015 0835   CALCIUM 10.0 10/25/2014 0551   ALKPHOS 60 08/25/2015 0835   ALKPHOS 80 10/23/2014 2230   AST 17 08/25/2015 0835   AST 24 10/23/2014 2230   ALT 18 08/25/2015 0835   ALT 32 10/23/2014 2230   BILITOT 0.81 08/25/2015 0835   BILITOT 0.5 10/23/2014 2230       RADIOGRAPHIC STUDIES: Ct Chest W Contrast  08/25/2015  CLINICAL DATA:  Restaging of left upper lobe lung cancer diagnosed in April 2016. Prior chemotherapy and radiotherapy. EXAM: CT CHEST WITH CONTRAST TECHNIQUE: Multidetector CT imaging of the chest was performed during intravenous contrast administration. CONTRAST:  21m OMNIPAQUE IOHEXOL 300 MG/ML  SOLN COMPARISON:  Multiple exams, including the 05/25/2015 FINDINGS: Mediastinum/Nodes: Coronary, aortic arch, and branch vessel atherosclerotic vascular disease. Right paratracheal node on image 12 series 2 measures 0.7 cm in short axis, stable. Subcarinal node below the right mainstem bronchus 7 mm in short axis, stable. No new adenopathy. Small type 1 hiatal hernia. New small pericardial lymph node 0.6 cm in short axis image 48 series 2. Lungs/Pleura: Stable scattered scarring in the lungs. Left paramediastinal and primarily left upper lobe opacities favoring radiation pneumonitis, similar distribution but mildly improved intensity compared to prior. The original anterior left upper lobe nodule is obscured by surrounding radiation pneumonitis and less prominent than on 11/06/2014. There is previously a more centrally located hypermetabolic pulmonary nodule on image 37 of series 6 of the prior PET-CT which  is likewise somewhat indistinct; there is a 1.5 by 1.2 cm area of nodularity in the left suprahilar region which may be from radiation pneumonitis although should be followed. Mild atelectasis in the superior segment left lower lobe. No progression of other faint nodularity in the lungs. Upper abdomen: Unremarkable Musculoskeletal: Thoracic spondylosis. Stable anterior compression at L1 and T12. IMPRESSION: 1. Slightly reduced radiation pneumonitis in the left upper lobe. The anterior left upper lobe previously hypermetabolic nodule remains obscured but reduced in size and conspicuity. The more posterior of the 2 previously hypermetabolic nodules is likewise obscured ; there is a 1.5 by 1.2 cm region of density in the left upper lobe which merits observation as it might contain this original nodule, although may also be due to the underlying radiation pneumonitis. Surveillance imaging likely warranted. 2. Other tiny scattered nodules in both lungs are likely benign and stable. 3. Stable radiation pneumonitis in the superior segment left lower lobe. 4. Scattered scarring in both lungs. 5. Other imaging findings of potential clinical significance: Coronary, aortic arch, and branch vessel atherosclerotic vascular disease. Small type 1 hiatal hernia. Electronically Signed   By: WVan ClinesM.D.   On: 08/25/2015 10:28  ASSESSMENT AND PLAN: This is a very pleasant 82 years old white male recently diagnosed with limited stage small cell lung cancer presented with a 2 left upper lobe pulmonary nodules as well as questionable mediastinal lymphadenopathy.  He underwent systemic chemotherapy with carboplatin and etoposide status post 4 cycles concurrent with radiation was almost complete response. This was followed by whole brain radiation The patient is currently on observation and doing fine except for mild fatigue. The recent CT scan of the chest showed no evidence for disease progression. I discussed the  scan results with the patient and his wife.  I recommended for him to continue on observation with repeat CT scan of the chest in 4 months. He was advised to call immediately if he has any concerning symptoms in the interval. The patient voices understanding of current disease status and treatment options and is in agreement with the current care plan.  All questions were answered. The patient knows to call the clinic with any problems, questions or concerns. We can certainly see the patient much sooner if necessary.  Disclaimer: This note was dictated with voice recognition software. Similar sounding words can inadvertently be transcribed and may not be corrected upon review.

## 2015-08-31 NOTE — Addendum Note (Signed)
Addended by: Lucile Crater on: 08/31/2015 09:06 AM   Modules accepted: Medications

## 2015-08-31 NOTE — Telephone Encounter (Signed)
per pof to sch pt appt-gave pt copy opf avs-adv [facility] will call to sch scan

## 2015-12-29 ENCOUNTER — Other Ambulatory Visit (HOSPITAL_BASED_OUTPATIENT_CLINIC_OR_DEPARTMENT_OTHER): Payer: Medicare Other

## 2015-12-29 ENCOUNTER — Ambulatory Visit (HOSPITAL_COMMUNITY)
Admission: RE | Admit: 2015-12-29 | Discharge: 2015-12-29 | Disposition: A | Discharge status: Home / Self Care | Payer: Medicare Other | Source: Ambulatory Visit | Attending: Internal Medicine | Admitting: Internal Medicine

## 2015-12-29 DIAGNOSIS — I7090 Unspecified atherosclerosis: Secondary | ICD-10-CM | POA: Insufficient documentation

## 2015-12-29 DIAGNOSIS — C3412 Malignant neoplasm of upper lobe, left bronchus or lung: Secondary | ICD-10-CM | POA: Diagnosis not present

## 2015-12-29 DIAGNOSIS — I2510 Atherosclerotic heart disease of native coronary artery without angina pectoris: Secondary | ICD-10-CM | POA: Diagnosis not present

## 2015-12-29 DIAGNOSIS — Z923 Personal history of irradiation: Secondary | ICD-10-CM | POA: Insufficient documentation

## 2015-12-29 DIAGNOSIS — J90 Pleural effusion, not elsewhere classified: Secondary | ICD-10-CM | POA: Insufficient documentation

## 2015-12-29 DIAGNOSIS — R918 Other nonspecific abnormal finding of lung field: Secondary | ICD-10-CM | POA: Insufficient documentation

## 2015-12-29 LAB — COMPREHENSIVE METABOLIC PANEL
ALBUMIN: 3.8 g/dL (ref 3.5–5.0)
ALK PHOS: 67 U/L (ref 40–150)
ALT: 23 U/L (ref 0–55)
AST: 23 U/L (ref 5–34)
Anion Gap: 9 mEq/L (ref 3–11)
BUN: 25.5 mg/dL (ref 7.0–26.0)
CHLORIDE: 106 meq/L (ref 98–109)
CO2: 24 mEq/L (ref 22–29)
CREATININE: 1.5 mg/dL — AB (ref 0.7–1.3)
Calcium: 9.8 mg/dL (ref 8.4–10.4)
EGFR: 43 mL/min/{1.73_m2} — ABNORMAL LOW (ref >90)
Glucose: 107 mg/dl (ref 70–140)
POTASSIUM: 5.2 meq/L — AB (ref 3.5–5.1)
SODIUM: 139 meq/L (ref 136–145)
Total Bilirubin: 0.5 mg/dL (ref 0.20–1.20)
Total Protein: 7.1 g/dL (ref 6.4–8.3)

## 2015-12-29 LAB — CBC WITH DIFFERENTIAL/PLATELET
BASO%: 0.5 % (ref 0.0–2.0)
BASOS ABS: 0 10*3/uL (ref 0.0–0.1)
EOS%: 3.2 % (ref 0.0–7.0)
Eosinophils Absolute: 0.2 10*3/uL (ref 0.0–0.5)
HEMATOCRIT: 36 % — AB (ref 38.4–49.9)
HEMOGLOBIN: 11.9 g/dL — AB (ref 13.0–17.1)
LYMPH#: 0.8 10*3/uL — AB (ref 0.9–3.3)
LYMPH%: 14.1 % (ref 14.0–49.0)
MCH: 32.8 pg (ref 27.2–33.4)
MCHC: 33 g/dL (ref 32.0–36.0)
MCV: 99.3 fL — ABNORMAL HIGH (ref 79.3–98.0)
MONO#: 0.8 10*3/uL (ref 0.1–0.9)
MONO%: 13.7 % (ref 0.0–14.0)
NEUT#: 4 10*3/uL (ref 1.5–6.5)
NEUT%: 68.5 % (ref 39.0–75.0)
Platelets: 195 10*3/uL (ref 140–400)
RBC: 3.62 10*6/uL — ABNORMAL LOW (ref 4.20–5.82)
RDW: 12.9 % (ref 11.0–14.6)
WBC: 5.8 10*3/uL (ref 4.0–10.3)

## 2015-12-29 MED ORDER — IOPAMIDOL (ISOVUE-300) INJECTION 61%
50.0000 mL | Freq: Once | INTRAVENOUS | Status: AC | PRN
  Administered 2015-12-29: 50 mL via INTRAVENOUS

## 2015-12-31 ENCOUNTER — Telehealth: Payer: Self-pay | Admitting: Internal Medicine

## 2015-12-31 ENCOUNTER — Encounter: Payer: Self-pay | Admitting: Internal Medicine

## 2015-12-31 ENCOUNTER — Ambulatory Visit (HOSPITAL_BASED_OUTPATIENT_CLINIC_OR_DEPARTMENT_OTHER): Payer: Medicare Other | Admitting: Internal Medicine

## 2015-12-31 VITALS — BP 135/66 | HR 77 | Temp 98.5°F | Resp 18 | Ht 70.0 in | Wt 187.0 lb

## 2015-12-31 DIAGNOSIS — D649 Anemia, unspecified: Secondary | ICD-10-CM | POA: Diagnosis not present

## 2015-12-31 DIAGNOSIS — E875 Hyperkalemia: Secondary | ICD-10-CM

## 2015-12-31 DIAGNOSIS — R5383 Other fatigue: Secondary | ICD-10-CM

## 2015-12-31 DIAGNOSIS — C3412 Malignant neoplasm of upper lobe, left bronchus or lung: Secondary | ICD-10-CM | POA: Diagnosis not present

## 2015-12-31 NOTE — Progress Notes (Signed)
[place] [phone]   Fax:(336) [phone]  OFFICE PROGRESS NOTE  [name], MD [place] [address]  DIAGNOSIS: Limited stage (T3, N2, M0) small cell lung cancer diagnosed in February 2016 presenting with 2 pulmonary nodules in the left upper lobe as well as questionable mediastinal lymphadenopathy.  PRIOR THERAPY:  1) Systemic chemotherapy with carboplatin for AUC of 5 on day 1 and etoposide 120 MG/M2 on days 1, 2 and 3 with Neulasta support on day 4. First dose on 12/02/2014. He is status post 4 cycles. This is concurrent with radiotherapy under the care of Dr. Tammi Klippel completed on 01/15/2015 and last dose of chemotherapy was on 02/03/2015. 2) whole brain irradiation under the care of Dr. Tammi Klippel completed on 07/14/2015.  CURRENT THERAPY: Observation.  INTERVAL HISTORY: Travis Medina 82 y.o. male returns to the clinic today for follow-up visit accompanied by his wife. The patient is feeling fine today with no specific complaints except for mild fatigue. He denied having any significant chest pain, shortness of breath, cough or hemoptysis. The patient denied having any significant nausea or vomiting, no fever or chills. He has no significant weight loss or night sweats. He had repeat CT scan of the chest performed recently and he is here for evaluation and discussion of his scan results.  MEDICAL HISTORY: Past Medical History  Diagnosis Date  . Essential hypertension   . Lung cancer ([place])   . S/P chemotherapy, time since 4-12 weeks   . S/P radiation therapy     ALLERGIES:  is allergic to morphine and related.  MEDICATIONS:  Current Outpatient Prescriptions  Medication Sig Dispense Refill  . alendronate (FOSAMAX) 70 MG tablet Reported on 08/13/2015    . aspirin 81 MG tablet Take 81 mg by mouth daily.    [name] lisinopril (PRINIVIL,ZESTRIL) 20 MG tablet Take 20 mg by mouth daily.    [name] acetaminophen (TYLENOL) 500 MG tablet Take 1  tablet (500 mg total) by mouth every 6 (six) hours as needed for mild pain. (Patient not taking: Reported on 12/31/2015) 30 tablet 0   No current facility-administered medications for this visit.    SURGICAL HISTORY:  Past Surgical History  Procedure Laterality Date  . Total knee arthroplasty      bilateral  . Lung biopsy      REVIEW OF SYSTEMS:  A comprehensive review of systems was negative except for: Constitutional: positive for fatigue   PHYSICAL EXAMINATION: General appearance: alert, cooperative and no distress Head: Normocephalic, without obvious abnormality, atraumatic Neck: no adenopathy, no JVD, supple, symmetrical, trachea midline and thyroid not enlarged, symmetric, no tenderness/mass/nodules Lymph nodes: Cervical, supraclavicular, and axillary nodes normal. Resp: clear to auscultation bilaterally Back: symmetric, no curvature. ROM normal. No CVA tenderness. Cardio: regular rate and rhythm, S1, S2 normal, no murmur, click, rub or gallop GI: soft, non-tender; bowel sounds normal; no masses,  no organomegaly Extremities: extremities normal, atraumatic, no cyanosis or edema Neurologic: Alert and oriented X 3, normal strength and tone. Normal symmetric reflexes. Normal coordination and gait  ECOG PERFORMANCE STATUS: 1 - Symptomatic but completely ambulatory  Blood pressure 135/66, pulse 77, temperature 98.5 F (36.9 C), temperature source Oral, resp. rate 18, height '5\' 10"'$  (1.778 m), weight 187 lb (84.823 kg), SpO2 100 %.  LABORATORY DATA: Lab Results  Component Value Date   WBC 5.8 12/29/2015   HGB 11.9* 12/29/2015   HCT 36.0* 12/29/2015   MCV 99.3* 12/29/2015   PLT 195  12/29/2015      Chemistry      Component Value Date/Time   NA 139 12/29/2015 0740   NA 134* 10/25/2014 0551   K 5.2* 12/29/2015 0740   K 4.9 10/25/2014 0551   CL 101 10/25/2014 0551   CO2 24 12/29/2015 0740   CO2 27 10/25/2014 0551   BUN 25.5 12/29/2015 0740   BUN 27* 10/25/2014 0551    CREATININE 1.5* 12/29/2015 0740   CREATININE 1.41* 10/25/2014 0551      Component Value Date/Time   CALCIUM 9.8 12/29/2015 0740   CALCIUM 10.0 10/25/2014 0551   ALKPHOS 67 12/29/2015 0740   ALKPHOS 80 10/23/2014 2230   AST 23 12/29/2015 0740   AST 24 10/23/2014 2230   ALT 23 12/29/2015 0740   ALT 32 10/23/2014 2230   BILITOT 0.50 12/29/2015 0740   BILITOT 0.5 10/23/2014 2230       RADIOGRAPHIC STUDIES: Ct Chest W Contrast  12/29/2015  CLINICAL DATA:  82 year old male with history of left-sided lung cancer diagnosed in 2016. Chemotherapy and radiation therapy complete. Restaging examination. EXAM: CT CHEST WITH CONTRAST TECHNIQUE: Multidetector CT imaging of the chest was performed during intravenous contrast administration. CONTRAST:  77m ISOVUE-300 IOPAMIDOL (ISOVUE-300) INJECTION 61% COMPARISON:  Chest CT 08/25/2015. FINDINGS: Mediastinum/Lymph Nodes: Heart size is normal. There is no significant pericardial fluid, thickening or pericardial calcification. There is atherosclerosis of the thoracic aorta, the great vessels of the mediastinum and the coronary arteries, including calcified atherosclerotic plaque in the left main, left anterior descending and right coronary arteries. No pathologically enlarged mediastinal or hilar lymph nodes. Esophagus is unremarkable in appearance. No axillary lymphadenopathy. Lungs/Pleura: Small left-sided pleural effusion lying dependently, new compared to the prior examination. No definite pleural enhancement or nodularity identified at this time. There continues to be extensive architectural distortion in the left lung, most evident in the paramediastinal distribution is of the left upper lobe and superior segment of the left lower lobe, most compatible with areas of postradiation fibrosis. Previously noted left upper lobe pulmonary nodule is no longer clearly identified in the midst of these postradiation changes. A few scattered peripheral predominant  pulmonary nodules are noted throughout the lungs bilaterally. Many of these are similar to prior examinations and unchanged, however, some others are new on today's study. The new nodules are subpleural in distribution, and most evident throughout the left lower lobe where they measure up to 7 x 3 mm (mean diameter of 5 mm) on image 119 of series 5. No acute consolidative airspace disease. Mild diffuse bronchial thickening with mild centrilobular and paraseptal emphysema. Upper Abdomen: Atherosclerosis. Musculoskeletal/Soft Tissues: There are no aggressive appearing lytic or blastic lesions noted in the visualized portions of the skeleton. Old compression fractures of T12 and L1 are noted, most severe at L1 where there is approximately 50% loss of anterior vertebral body height. IMPRESSION: 1. Evolving postradiation changes in the left lung, now completely obscuring the previously treated left upper lobe pulmonary nodules. 2. New small left-sided pleural effusion. 3. Many of the other scattered tiny pulmonary nodules are stable compared to the prior study, however, there are also multiple new left-sided pulmonary nodules the majority of which are subpleural in distribution. This is nonspecific, and these may simply represent reactive subpleural lymph nodes in the setting of a new pleural effusion, however, the possibility of developing lymphangitic spread of disease in the left lung should be considered (although not favored), and close attention on future followup studies is recommended to ensure the  stability or resolution of these findings. 4. Atherosclerosis, including left main and 2 vessel coronary artery disease. Assessment for potential risk factor modification, dietary therapy or pharmacologic therapy may be warranted, if clinically indicated. 5. Additional incidental findings, as above. Electronically Signed   By: Vinnie Langton M.D.   On: 12/29/2015 10:02    ASSESSMENT AND PLAN: This is a very  pleasant 82 years old white male recently diagnosed with limited stage small cell lung cancer presented with a 2 left upper lobe pulmonary nodules as well as questionable mediastinal lymphadenopathy.  He underwent systemic chemotherapy with carboplatin and etoposide status post 4 cycles concurrent with radiation was almost complete response. This was followed by whole brain radiation The patient is currently on observation and doing fine except for persistent mild fatigue probably secondary to his anemia. The recent CT scan of the chest showed no evidence for disease progression. I discussed the scan results with the patient and his wife.  For the anemia, I advised the patient to start taking over-the-counter oral iron tablets 1-2 tablets every day. For the hyperkalemia, I advised the patient to cut down on the potassium-rich food. I recommended for him to continue on observation with repeat CT scan of the chest in 6 months. He was advised to call immediately if he has any concerning symptoms in the interval. The patient voices understanding of current disease status and treatment options and is in agreement with the current care plan.  All questions were answered. The patient knows to call the clinic with any problems, questions or concerns. We can certainly see the patient much sooner if necessary.  Disclaimer: This note was dictated with voice recognition software. Similar sounding words can inadvertently be transcribed and may not be corrected upon review.

## 2015-12-31 NOTE — Telephone Encounter (Signed)
per pof to sch pt appt-gave pt copy of avs °

## 2016-02-19 ENCOUNTER — Emergency Department (HOSPITAL_COMMUNITY): Payer: Medicare Other

## 2016-02-19 ENCOUNTER — Observation Stay (HOSPITAL_COMMUNITY)
Admission: EM | Admit: 2016-02-19 | Discharge: 2016-02-22 | Disposition: A | Discharge status: Home / Self Care | Payer: Medicare Other | Attending: Internal Medicine | Admitting: Internal Medicine

## 2016-02-19 ENCOUNTER — Encounter (HOSPITAL_COMMUNITY): Payer: Self-pay | Admitting: *Deleted

## 2016-02-19 DIAGNOSIS — Z9221 Personal history of antineoplastic chemotherapy: Secondary | ICD-10-CM | POA: Insufficient documentation

## 2016-02-19 DIAGNOSIS — Z923 Personal history of irradiation: Secondary | ICD-10-CM | POA: Insufficient documentation

## 2016-02-19 DIAGNOSIS — R918 Other nonspecific abnormal finding of lung field: Secondary | ICD-10-CM | POA: Diagnosis present

## 2016-02-19 DIAGNOSIS — Z87891 Personal history of nicotine dependence: Secondary | ICD-10-CM | POA: Diagnosis not present

## 2016-02-19 DIAGNOSIS — Z7982 Long term (current) use of aspirin: Secondary | ICD-10-CM | POA: Diagnosis not present

## 2016-02-19 DIAGNOSIS — E871 Hypo-osmolality and hyponatremia: Secondary | ICD-10-CM

## 2016-02-19 DIAGNOSIS — I129 Hypertensive chronic kidney disease with stage 1 through stage 4 chronic kidney disease, or unspecified chronic kidney disease: Secondary | ICD-10-CM | POA: Insufficient documentation

## 2016-02-19 DIAGNOSIS — I10 Essential (primary) hypertension: Secondary | ICD-10-CM | POA: Diagnosis not present

## 2016-02-19 DIAGNOSIS — J90 Pleural effusion, not elsewhere classified: Principal | ICD-10-CM | POA: Diagnosis present

## 2016-02-19 DIAGNOSIS — N183 Chronic kidney disease, stage 3 (moderate): Secondary | ICD-10-CM | POA: Insufficient documentation

## 2016-02-19 DIAGNOSIS — Z85118 Personal history of other malignant neoplasm of bronchus and lung: Secondary | ICD-10-CM | POA: Insufficient documentation

## 2016-02-19 DIAGNOSIS — Z96653 Presence of artificial knee joint, bilateral: Secondary | ICD-10-CM | POA: Insufficient documentation

## 2016-02-19 DIAGNOSIS — Z7983 Long term (current) use of bisphosphonates: Secondary | ICD-10-CM | POA: Diagnosis not present

## 2016-02-19 DIAGNOSIS — N189 Chronic kidney disease, unspecified: Secondary | ICD-10-CM

## 2016-02-19 DIAGNOSIS — Z79899 Other long term (current) drug therapy: Secondary | ICD-10-CM | POA: Diagnosis not present

## 2016-02-19 DIAGNOSIS — R21 Rash and other nonspecific skin eruption: Secondary | ICD-10-CM | POA: Insufficient documentation

## 2016-02-19 DIAGNOSIS — C3412 Malignant neoplasm of upper lobe, left bronchus or lung: Secondary | ICD-10-CM | POA: Diagnosis present

## 2016-02-19 LAB — PROTIME-INR
INR: 1.17 (ref 0.00–1.49)
PROTHROMBIN TIME: 15 s (ref 11.6–15.2)

## 2016-02-19 LAB — COMPREHENSIVE METABOLIC PANEL
ALT: 25 U/L (ref 17–63)
ANION GAP: 9 (ref 5–15)
AST: 22 U/L (ref 15–41)
Albumin: 3.3 g/dL — ABNORMAL LOW (ref 3.5–5.0)
Alkaline Phosphatase: 88 U/L (ref 38–126)
BILIRUBIN TOTAL: 0.6 mg/dL (ref 0.3–1.2)
BUN: 29 mg/dL — ABNORMAL HIGH (ref 6–20)
CHLORIDE: 97 mmol/L — AB (ref 101–111)
CO2: 20 mmol/L — ABNORMAL LOW (ref 22–32)
Calcium: 9.2 mg/dL (ref 8.9–10.3)
Creatinine, Ser: 1.42 mg/dL — ABNORMAL HIGH (ref 0.61–1.24)
GFR, EST AFRICAN AMERICAN: 53 mL/min — AB (ref >60)
GFR, EST NON AFRICAN AMERICAN: 45 mL/min — AB (ref >60)
Glucose, Bld: 116 mg/dL — ABNORMAL HIGH (ref 65–99)
POTASSIUM: 4.8 mmol/L (ref 3.5–5.1)
Sodium: 126 mmol/L — ABNORMAL LOW (ref 135–145)
TOTAL PROTEIN: 6.8 g/dL (ref 6.5–8.1)

## 2016-02-19 LAB — CBC
HCT: 28.7 % — ABNORMAL LOW (ref 39.0–52.0)
Hemoglobin: 9.9 g/dL — ABNORMAL LOW (ref 13.0–17.0)
MCH: 32 pg (ref 26.0–34.0)
MCHC: 34.5 g/dL (ref 30.0–36.0)
MCV: 92.9 fL (ref 78.0–100.0)
PLATELETS: 269 10*3/uL (ref 150–400)
RBC: 3.09 MIL/uL — ABNORMAL LOW (ref 4.22–5.81)
RDW: 11.9 % (ref 11.5–15.5)
WBC: 9.5 10*3/uL (ref 4.0–10.5)

## 2016-02-19 MED ORDER — IOPAMIDOL (ISOVUE-300) INJECTION 61%
INTRAVENOUS | Status: AC
  Administered 2016-02-19: 75 mL
  Filled 2016-02-19: qty 75

## 2016-02-19 MED ORDER — SODIUM CHLORIDE 0.9% FLUSH
3.0000 mL | Freq: Two times a day (BID) | INTRAVENOUS | Status: DC
  Administered 2016-02-20 – 2016-02-21 (×4): 3 mL via INTRAVENOUS

## 2016-02-19 MED ORDER — HYDROCORTISONE 1 % EX OINT
TOPICAL_OINTMENT | Freq: Two times a day (BID) | CUTANEOUS | Status: DC
  Administered 2016-02-19 – 2016-02-20 (×2): via TOPICAL
  Administered 2016-02-20: 1 via TOPICAL
  Administered 2016-02-21: 22:00:00 via TOPICAL
  Administered 2016-02-21: 1 via TOPICAL
  Filled 2016-02-19: qty 28.35

## 2016-02-19 MED ORDER — SODIUM CHLORIDE 0.9% FLUSH
3.0000 mL | Freq: Two times a day (BID) | INTRAVENOUS | Status: DC
  Administered 2016-02-20: 3 mL via INTRAVENOUS

## 2016-02-19 MED ORDER — ONDANSETRON HCL 4 MG PO TABS: 4.0000 mg | ORAL_TABLET | Freq: Four times a day (QID) | ORAL | Status: DC | PRN

## 2016-02-19 MED ORDER — ONDANSETRON HCL 4 MG/2ML IJ SOLN: 4.0000 mg | Freq: Four times a day (QID) | INTRAMUSCULAR | Status: DC | PRN

## 2016-02-19 MED ORDER — SODIUM CHLORIDE 0.9 % IV SOLN: 250.0000 mL | INTRAVENOUS | Status: DC | PRN

## 2016-02-19 MED ORDER — HEPARIN SODIUM (PORCINE) 5000 UNIT/ML IJ SOLN
5000.0000 [IU] | Freq: Three times a day (TID) | INTRAMUSCULAR | Status: DC
  Administered 2016-02-19 – 2016-02-22 (×7): 5000 [IU] via SUBCUTANEOUS
  Filled 2016-02-19 (×5): qty 1

## 2016-02-19 MED ORDER — SODIUM CHLORIDE 0.9% FLUSH: 3.0000 mL | INTRAVENOUS | Status: DC | PRN

## 2016-02-19 MED ORDER — LISINOPRIL 10 MG PO TABS: 20.0000 mg | ORAL_TABLET | Freq: Once | ORAL | Status: DC

## 2016-02-19 MED ORDER — LISINOPRIL 10 MG PO TABS
20.0000 mg | ORAL_TABLET | Freq: Every day | ORAL | Status: DC
  Administered 2016-02-19: 20 mg via ORAL
  Filled 2016-02-19 (×2): qty 2

## 2016-02-19 MED ORDER — ACETAMINOPHEN 500 MG PO TABS
1000.0000 mg | ORAL_TABLET | Freq: Four times a day (QID) | ORAL | Status: DC | PRN
  Administered 2016-02-22: 1000 mg via ORAL
  Filled 2016-02-19: qty 2

## 2016-02-19 MED ORDER — SENNOSIDES-DOCUSATE SODIUM 8.6-50 MG PO TABS
1.0000 | ORAL_TABLET | Freq: Every evening | ORAL | Status: DC | PRN
  Administered 2016-02-20: 1 via ORAL
  Filled 2016-02-19: qty 1

## 2016-02-19 NOTE — ED Notes (Signed)
Report attempted X1 

## 2016-02-19 NOTE — ED Notes (Signed)
Pt in from [place], MD office for significant pleural effusion, with L lung appearing white on x ray, pt hx of lung CA, pt rcvd 32 radiation txs & 12 chemo tx last, pt reports increasing SOB onset x 1 wk, pt denies CP, n/v/d, denies cough, A&O x4

## 2016-02-19 NOTE — ED Notes (Signed)
Per PCP-left lung full of fluid-history of lung cancer-needs fluid pulled off

## 2016-02-19 NOTE — Progress Notes (Signed)
Pt c progressive SOB c Exertion.Called today and asked if it was related to his TdAP - We said no. We brought him in. + DOE. Sats 96-97% RA. CXR showed L Lung White out  = Complete Opacification O L Hemithorax c Mediastinal Shift to The R indicating Mass and Effusion. Sent to ED for Eval and Rx - Needs Ct Chest Hopefully more fluid that can be tapped and make him feel better No Breath sounds on L. In good spirits.

## 2016-02-19 NOTE — H&P (Signed)
History and Physical    Travis Medina IWP:[id number] DOB: 08/10/1933 DOA: 02/19/2016  Referring MD/NP/PA: Dr. Eddie Dibbles PCP: [name], MD Outpatient Specialists: Dr. Mckinley Jewel Patient coming from: home-- sent in by PCP  Chief Complaint: shortness of breath  HPI: Travis Medina is a 82 y.o. male with medical history significant of lung cancer- treated with chemo and radiation.  Comes with a few week history of worsening SOB.  Every day was slightly worse.  No fever, no chills.  He was seen by his PCP today for the SOB.  His O2 sats were stable on RA, but chest x ray showed a complete white out on left side with mediastinal shift to right.  He was sent to the ER for evaluation and CT scan.   No large recent weight loss Decrease in appetite  ED Course:  Labs: CR at baseline, CBC showed anemia CT scan: 1. Interval development of large left pleural effusion. 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 2. Left upper lobe lung mass is identified and worrisome for tumor recurrence. Consider further evaluation with PET-CT is advised. 3. Aortic atherosclerosis and coronary artery calcifications.  Hospitalist were asked to admit for thoracentesis   Review of Systems: all systems reviewed, negative unless stated above in HPI   Past Medical History  Diagnosis Date  . Essential hypertension   . Lung cancer ([place])   . S/P chemotherapy, time since 4-12 weeks   . S/P radiation therapy     Past Surgical History  Procedure Laterality Date  . Total knee arthroplasty      bilateral  . Lung biopsy       reports that he quit smoking about 40 years ago. His smoking use included Cigarettes. He has a 25 pack-year smoking history. He has never used smokeless tobacco. He reports that he does not drink alcohol or use illicit drugs.  Allergies  Allergen Reactions  . Morphine And Related Itching and Rash    Family History  Problem Relation Age of Onset  . Heart  failure Mother   . Colon cancer Sister      Prior to Admission medications   Medication Sig Start Date End Date Taking? Authorizing Provider  acetaminophen (TYLENOL) 500 MG tablet Take 1 tablet (500 mg total) by mouth every 6 (six) hours as needed for mild pain. Patient taking differently: Take 1,000 mg by mouth every 6 (six) hours as needed for mild pain.  10/25/14  Yes Florencia Reasons, MD  alendronate (FOSAMAX) 70 MG tablet Take 70 mg by mouth every 14 (fourteen) days. Last dose 02/13/16 06/09/15  Yes Historical Provider, MD  aspirin EC 81 MG tablet Take 81 mg by mouth at bedtime.   Yes Historical Provider, MD  lisinopril (PRINIVIL,ZESTRIL) 20 MG tablet Take 20 mg by mouth at bedtime.  10/05/15  Yes Historical Provider, MD    Physical Exam: Filed Vitals:   02/19/16 1547 02/19/16 1640 02/19/16 1645 02/19/16 1700  BP: 103/73 136/78 124/76 113/72  Pulse: 95 88 84 83  Temp: 98.7 F (37.1 C)     TempSrc: Oral     Resp: '20 29 27 20  '$ Weight: 87.232 kg (192 lb 5 oz)     SpO2: 100% 99% 99% 100%      Constitutional: NAD, calm, remarkably comfortable- 2L O2 Filed Vitals:   02/19/16 1547 02/19/16 1640 02/19/16 1645 02/19/16 1700  BP: 103/73 136/78 124/76 113/72  Pulse: 95 88 84 83  Temp: 98.7 F (37.1 C)  TempSrc: Oral     Resp: '20 29 27 20  '$ Weight: 87.232 kg (192 lb 5 oz)     SpO2: 100% 99% 99% 100%   Eyes: PERRL, lids and conjunctivae normal ENMT: Mucous membranes are moist. Posterior pharynx clear of any exudate or lesions.Normal dentition.  Neck: normal, supple, no masses, no thyromegaly Respiratory: left lung with minimal breath sounds Cardiovascular: Regular rate and rhythm, no murmurs / rubs / gallops. No extremity edema. 2+ pedal pulses. No carotid bruits.  Abdomen: no tenderness, no masses palpated. No hepatosplenomegaly. Bowel sounds positive.  Musculoskeletal: no clubbing / cyanosis. No joint deformity upper and lower extremities. Good ROM, no contractures. Normal muscle tone.    Skin: 3 round areas of clearing redness on forehead Neurologic: CN 2-12 grossly intact. Sensation intact, DTR normal. Strength 5/5 in all 4.  Psychiatric: Normal judgment and insight. Alert and oriented x 3. Normal mood.    Labs on Admission: I have personally reviewed following labs and imaging studies  CBC:  Recent Labs Lab 02/19/16 1557  WBC 9.5  HGB 9.9*  HCT 28.7*  MCV 92.9  PLT 789   Basic Metabolic Panel:  Recent Labs Lab 02/19/16 1557  NA 126*  K 4.8  CL 97*  CO2 20*  GLUCOSE 116*  BUN 29*  CREATININE 1.42*  CALCIUM 9.2   GFR: Estimated Creatinine Clearance: 43.6 mL/min (by C-G formula based on Cr of 1.42). Liver Function Tests:  Recent Labs Lab 02/19/16 1557  AST 22  ALT 25  ALKPHOS 88  BILITOT 0.6  PROT 6.8  ALBUMIN 3.3*   No results for input(s): LIPASE, AMYLASE in the last 168 hours. No results for input(s): AMMONIA in the last 168 hours. Coagulation Profile:  Recent Labs Lab 02/19/16 1638  INR 1.17   Cardiac Enzymes: No results for input(s): CKTOTAL, CKMB, CKMBINDEX, TROPONINI in the last 168 hours. BNP (last 3 results) No results for input(s): PROBNP in the last 8760 hours. HbA1C: No results for input(s): HGBA1C in the last 72 hours. CBG: No results for input(s): GLUCAP in the last 168 hours. Lipid Profile: No results for input(s): CHOL, HDL, LDLCALC, TRIG, CHOLHDL, LDLDIRECT in the last 72 hours. Thyroid Function Tests: No results for input(s): TSH, T4TOTAL, FREET4, T3FREE, THYROIDAB in the last 72 hours. Anemia Panel: No results for input(s): VITAMINB12, FOLATE, FERRITIN, TIBC, IRON, RETICCTPCT in the last 72 hours. Urine analysis:    Component Value Date/Time   COLORURINE YELLOW 10/24/2014 0509   APPEARANCEUR CLEAR 10/24/2014 0509   LABSPEC 1.013 10/24/2014 0509   PHURINE 5.5 10/24/2014 0509   GLUCOSEU NEGATIVE 10/24/2014 0509   HGBUR TRACE* 10/24/2014 0509   BILIRUBINUR NEGATIVE 10/24/2014 0509   KETONESUR NEGATIVE  10/24/2014 0509   PROTEINUR NEGATIVE 10/24/2014 0509   UROBILINOGEN 0.2 10/24/2014 0509   NITRITE NEGATIVE 10/24/2014 0509   LEUKOCYTESUR NEGATIVE 10/24/2014 0509   Sepsis Labs: Invalid input(s): PROCALCITONIN, LACTICIDVEN No results found for this or any previous visit (from the past 240 hour(s)).   Radiological Exams on Admission: Dg Chest 2 View  02/19/2016  CLINICAL DATA:  Shortness of breath worsening with exertion EXAM: CHEST  2 VIEW COMPARISON:  CT chest 02/19/2016 FINDINGS: There is a near complete opacification of the left thorax likely reflecting a combination of large pleural effusion, atelectatic lung and possible left upper lobe mass. There is mild interstitial thickening of the right lung. There is no pneumothorax. The heart and mediastinal contours are unremarkable. The osseous structures are unremarkable. IMPRESSION: Near complete  opacification of the left thorax likely reflecting a combination of large pleural effusion, atelectatic lung and possible left upper lobe mass. Electronically Signed   By: Kathreen Devoid   On: 02/19/2016 18:17   Ct Chest W Contrast  02/19/2016  CLINICAL DATA:  Evaluate left lung pleural effusion identified on chest radiograph. Progressive shortness of breath. EXAM: CT CHEST WITH CONTRAST TECHNIQUE: Multidetector CT imaging of the chest was performed during intravenous contrast administration. CONTRAST:  32m ISOVUE-300 IOPAMIDOL (ISOVUE-300) INJECTION 61% COMPARISON:  12/29/2015 FINDINGS: Mediastinum/Lymph Nodes: The heart size appears normal. Aortic atherosclerosis identified. Calcification in the LAD coronary artery is identified. The trachea appears patent and is midline. Normal appearance of the esophagus. Sub- carinal lymph node measures 1.2 cm, image 67 of series 3. Previously this measured 7 mm. Lungs/Pleura: There is a very large left pleural effusion with near complete atelectasis and consolidation of the left lung accounting for the white out  appearance of the left hemi thorax on chest radiograph. Subtle, small areas of suspected pleural nodularity are identified compatible with malignant effusion in trans pleural spread of tumor. Index lesion anteriorly measures 2.4 by 0.9 cm, image 89 of series 3. Posterior medially there is a pleural nodule measuring 2.2 by 0.7 cm, image 138 of series 3. Soft tissue attenuating mass within the left upper lobe measures 2.2 x 2.4 cm and is scan SPhoebe Sharpsfor recurrent tumor. Chronic interstitial changes are again noted involving the right lung. Areas of traction bronchiectasis, interstitial reticulation and subpleural honeycombing are noted. Upper abdomen: No acute findings noted. The visualized portions of the liver, spleen, and adrenal glands are unremarkable. Musculoskeletal: No chest wall mass or suspicious bone lesions identified. Multi level spondylosis is identified within the thoracic spine. There is no aggressive lytic or sclerotic bone lesion identified. Chronic appearing wedge deformity involving the lower thoracic vertebra appears unchanged from previous study. IMPRESSION: 1. Interval development of large left pleural effusion. 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 2. Left upper lobe lung mass is identified and worrisome for tumor recurrence. Consider further evaluation with PET-CT is advised. 3. Aortic atherosclerosis and coronary artery calcifications. Electronically Signed   By: TKerby MoorsM.D.   On: 02/19/2016 17:48      Assessment/Plan Active Problems:   Essential hypertension   Lung mass   Primary cancer of left upper lobe of lung (HCC)   Pleural effusion   Hyponatremia   CKD (chronic kidney disease)  Hyponatremia -related to pleural effusion and possible cancer re-occurrence  Pleural effusion -needs large volume thoracentesis for symptom management and DIAGNOSIS -suspect cancer re-occurrence but will get echo  -sent for cytology as well as labs  for lights criteria -doubt PNA- no fever, no wBC  Rash on forehead -try steroids in case eczema if worsens will know fungal and change to antifungal  HTN -lisinopril  CKD- stage III -at baseline   DVT prophylaxis: heparin Code Status: full Family Communication: wife/daughter at bedside Disposition Plan: home in AM Consults called[place] Admission status: obs   JLeonardDO [facility] Pager [phone] If 7PM-7AM, please contact night-coverage [email or web address] Password [email or web address] 02/19/2016, 6:53 PM

## 2016-02-19 NOTE — ED Provider Notes (Signed)
CSN: [id number]     Arrival date & time 02/19/16  1536 History   First MD Initiated Contact with Patient 02/19/16 1611     Chief Complaint  Patient presents with  . Shortness of Breath     (Consider location/radiation/quality/duration/timing/severity/associated sxs/prior Treatment) Patient is a 82 y.o. male presenting with shortness of breath. The history is provided by the patient (Patient complains of shortness of breath for 1 week. Patient history of lung cancer).  Shortness of Breath Severity:  Moderate Onset quality:  Sudden Timing:  Constant Progression:  Unchanged Chronicity:  New Context: activity   Associated symptoms: no chest pain, no cough, no headaches and no rash     Past Medical History  Diagnosis Date  . Essential hypertension   . Lung cancer ([place])   . S/P chemotherapy, time since 4-12 weeks   . S/P radiation therapy    Past Surgical History  Procedure Laterality Date  . Total knee arthroplasty      bilateral  . Lung biopsy     Family History  Problem Relation Age of Onset  . Heart failure Mother   . Colon cancer Sister    Social History  Substance Use Topics  . Smoking status: Former Smoker -- 1.00 packs/day for 25 years    Types: Cigarettes    Quit date: 08/09/1975  . Smokeless tobacco: Never Used  . Alcohol Use: No    Review of Systems  Constitutional: Negative for appetite change and fatigue.  HENT: Negative for congestion, ear discharge and sinus pressure.   Eyes: Negative for discharge.  Respiratory: Positive for shortness of breath. Negative for cough.   Cardiovascular: Negative for chest pain.  Genitourinary: Negative for frequency and hematuria.  Musculoskeletal: Negative for back pain.  Skin: Negative for rash.  Neurological: Negative for seizures and headaches.  Psychiatric/Behavioral: Negative for hallucinations.      Allergies  Morphine and related  Home Medications   Prior to Admission medications   Medication Sig  Start Date End Date Taking? Authorizing Provider  acetaminophen (TYLENOL) 500 MG tablet Take 1 tablet (500 mg total) by mouth every 6 (six) hours as needed for mild pain. Patient taking differently: Take 1,000 mg by mouth every 6 (six) hours as needed for mild pain.  10/25/14  Yes Florencia Reasons, MD  alendronate (FOSAMAX) 70 MG tablet Take 70 mg by mouth every 14 (fourteen) days. Last dose 02/13/16 06/09/15  Yes Historical Provider, MD  aspirin EC 81 MG tablet Take 81 mg by mouth at bedtime.   Yes Historical Provider, MD  lisinopril (PRINIVIL,ZESTRIL) 20 MG tablet Take 20 mg by mouth at bedtime.  10/05/15  Yes Historical Provider, MD   BP 113/72 mmHg  Pulse 83  Temp(Src) 98.7 F (37.1 C) (Oral)  Resp 20  Wt 192 lb 5 oz (87.232 kg)  SpO2 100% Physical Exam  Constitutional: He is oriented to person, place, and time. He appears well-developed.  HENT:  Head: Normocephalic.  Eyes: Conjunctivae and EOM are normal. No scleral icterus.  Neck: Neck supple. No thyromegaly present.  Cardiovascular: Normal rate and regular rhythm.  Exam reveals no gallop and no friction rub.   No murmur heard. Pulmonary/Chest: No stridor. He has no wheezes. He has no rales. He exhibits no tenderness.  Decreased breath sounds left lung  Abdominal: He exhibits no distension. There is no tenderness. There is no rebound.  Musculoskeletal: Normal range of motion. He exhibits no edema.  Lymphadenopathy:    He has no cervical  adenopathy.  Neurological: He is oriented to person, place, and time. He exhibits normal muscle tone. Coordination normal.  Skin: No rash noted. No erythema.  Psychiatric: He has a normal mood and affect. His behavior is normal.    ED Course  Procedures (including critical care time) Labs Review Labs Reviewed  CBC - Abnormal; Notable for the following:    RBC 3.09 (*)    Hemoglobin 9.9 (*)    HCT 28.7 (*)    All other components within normal limits  COMPREHENSIVE METABOLIC PANEL - Abnormal; Notable  for the following:    Sodium 126 (*)    Chloride 97 (*)    CO2 20 (*)    Glucose, Bld 116 (*)    BUN 29 (*)    Creatinine, Ser 1.42 (*)    Albumin 3.3 (*)    GFR calc non Af Amer 45 (*)    GFR calc Af Amer 53 (*)    All other components within normal limits  PROTIME-INR    Imaging Review Dg Chest 2 View  02/19/2016  CLINICAL DATA:  Shortness of breath worsening with exertion EXAM: CHEST  2 VIEW COMPARISON:  CT chest 02/19/2016 FINDINGS: There is a near complete opacification of the left thorax likely reflecting a combination of large pleural effusion, atelectatic lung and possible left upper lobe mass. There is mild interstitial thickening of the right lung. There is no pneumothorax. The heart and mediastinal contours are unremarkable. The osseous structures are unremarkable. IMPRESSION: Near complete opacification of the left thorax likely reflecting a combination of large pleural effusion, atelectatic lung and possible left upper lobe mass. Electronically Signed   By: Kathreen Devoid   On: 02/19/2016 18:17   Ct Chest W Contrast  02/19/2016  CLINICAL DATA:  Evaluate left lung pleural effusion identified on chest radiograph. Progressive shortness of breath. EXAM: CT CHEST WITH CONTRAST TECHNIQUE: Multidetector CT imaging of the chest was performed during intravenous contrast administration. CONTRAST:  7m ISOVUE-300 IOPAMIDOL (ISOVUE-300) INJECTION 61% COMPARISON:  12/29/2015 FINDINGS: Mediastinum/Lymph Nodes: The heart size appears normal. Aortic atherosclerosis identified. Calcification in the LAD coronary artery is identified. The trachea appears patent and is midline. Normal appearance of the esophagus. Sub- carinal lymph node measures 1.2 cm, image 67 of series 3. Previously this measured 7 mm. Lungs/Pleura: There is a very large left pleural effusion with near complete atelectasis and consolidation of the left lung accounting for the white out appearance of the left hemi thorax on chest  radiograph. Subtle, small areas of suspected pleural nodularity are identified compatible with malignant effusion in trans pleural spread of tumor. Index lesion anteriorly measures 2.4 by 0.9 cm, image 89 of series 3. Posterior medially there is a pleural nodule measuring 2.2 by 0.7 cm, image 138 of series 3. Soft tissue attenuating mass within the left upper lobe measures 2.2 x 2.4 cm and is scan SPhoebe Sharpsfor recurrent tumor. Chronic interstitial changes are again noted involving the right lung. Areas of traction bronchiectasis, interstitial reticulation and subpleural honeycombing are noted. Upper abdomen: No acute findings noted. The visualized portions of the liver, spleen, and adrenal glands are unremarkable. Musculoskeletal: No chest wall mass or suspicious bone lesions identified. Multi level spondylosis is identified within the thoracic spine. There is no aggressive lytic or sclerotic bone lesion identified. Chronic appearing wedge deformity involving the lower thoracic vertebra appears unchanged from previous study. IMPRESSION: 1. Interval development of large left pleural effusion. The presence of pleural nodules favor a malignant left  pleural effusion. 2. Left upper lobe lung mass is identified and worrisome for tumor recurrence. Consider further evaluation with PET-CT is advised. 3. Aortic atherosclerosis and coronary artery calcifications. Electronically Signed   By: Kerby Moors M.D.   On: 02/19/2016 17:48   I have personally reviewed and evaluated these images and lab results as part of my medical decision-making.   EKG Interpretation None      MDM   Final diagnoses:  Pleural effusion    Patient with pleural effusion and possible lung cancer will be admitted for further workup    Milton Ferguson, MD 02/19/16 1831

## 2016-02-20 ENCOUNTER — Observation Stay (HOSPITAL_COMMUNITY): Payer: Medicare Other

## 2016-02-20 ENCOUNTER — Observation Stay (HOSPITAL_BASED_OUTPATIENT_CLINIC_OR_DEPARTMENT_OTHER): Payer: Medicare Other

## 2016-02-20 DIAGNOSIS — E871 Hypo-osmolality and hyponatremia: Secondary | ICD-10-CM | POA: Diagnosis not present

## 2016-02-20 DIAGNOSIS — R918 Other nonspecific abnormal finding of lung field: Secondary | ICD-10-CM | POA: Diagnosis not present

## 2016-02-20 DIAGNOSIS — I10 Essential (primary) hypertension: Secondary | ICD-10-CM | POA: Diagnosis not present

## 2016-02-20 DIAGNOSIS — R0600 Dyspnea, unspecified: Secondary | ICD-10-CM

## 2016-02-20 DIAGNOSIS — J90 Pleural effusion, not elsewhere classified: Secondary | ICD-10-CM | POA: Diagnosis not present

## 2016-02-20 LAB — COMPREHENSIVE METABOLIC PANEL
ALT: 21 U/L (ref 17–63)
AST: 19 U/L (ref 15–41)
Albumin: 2.8 g/dL — ABNORMAL LOW (ref 3.5–5.0)
Alkaline Phosphatase: 79 U/L (ref 38–126)
Anion gap: 8 (ref 5–15)
BUN: 26 mg/dL — AB (ref 6–20)
CHLORIDE: 101 mmol/L (ref 101–111)
CO2: 24 mmol/L (ref 22–32)
CREATININE: 1.41 mg/dL — AB (ref 0.61–1.24)
Calcium: 9.3 mg/dL (ref 8.9–10.3)
GFR calc Af Amer: 53 mL/min — ABNORMAL LOW (ref >60)
GFR calc non Af Amer: 46 mL/min — ABNORMAL LOW (ref >60)
Glucose, Bld: 110 mg/dL — ABNORMAL HIGH (ref 65–99)
POTASSIUM: 5.4 mmol/L — AB (ref 3.5–5.1)
SODIUM: 133 mmol/L — AB (ref 135–145)
Total Bilirubin: 0.5 mg/dL (ref 0.3–1.2)
Total Protein: 6.2 g/dL — ABNORMAL LOW (ref 6.5–8.1)

## 2016-02-20 LAB — ECHOCARDIOGRAM COMPLETE
Height: 70 in
Weight: 3030.4 [oz_av]

## 2016-02-20 LAB — LACTATE DEHYDROGENASE: LDH: 141 U/L (ref 98–192)

## 2016-02-20 LAB — BODY FLUID CELL COUNT WITH DIFFERENTIAL
EOS FL: 9 %
Lymphs, Fluid: 37 %
MONOCYTE-MACROPHAGE-SEROUS FLUID: 0 % — AB (ref 50–90)
NEUTROPHIL FLUID: 54 % — AB (ref 0–25)
WBC FLUID: 1003 uL — AB (ref 0–1000)

## 2016-02-20 LAB — CBC
HCT: 29.2 % — ABNORMAL LOW (ref 39.0–52.0)
Hemoglobin: 9.5 g/dL — ABNORMAL LOW (ref 13.0–17.0)
MCH: 30.6 pg (ref 26.0–34.0)
MCHC: 32.5 g/dL (ref 30.0–36.0)
MCV: 94.2 fL (ref 78.0–100.0)
PLATELETS: 281 10*3/uL (ref 150–400)
RBC: 3.1 MIL/uL — ABNORMAL LOW (ref 4.22–5.81)
RDW: 11.9 % (ref 11.5–15.5)
WBC: 9.5 10*3/uL (ref 4.0–10.5)

## 2016-02-20 LAB — GRAM STAIN

## 2016-02-20 LAB — PROTEIN, BODY FLUID: Total protein, fluid: 4.5 g/dL

## 2016-02-20 LAB — LACTATE DEHYDROGENASE, PLEURAL OR PERITONEAL FLUID: LD FL: 228 U/L — AB (ref 3–23)

## 2016-02-20 MED ORDER — ALPRAZOLAM 0.25 MG PO TABS: 0.2500 mg | ORAL_TABLET | Freq: Two times a day (BID) | ORAL | Status: DC | PRN

## 2016-02-20 MED ORDER — LISINOPRIL 10 MG PO TABS
20.0000 mg | ORAL_TABLET | Freq: Every day | ORAL | Status: DC
Start: 2016-02-20 — End: 2016-02-22
  Filled 2016-02-20: qty 2

## 2016-02-20 MED ORDER — LIDOCAINE HCL (PF) 1 % IJ SOLN
INTRAMUSCULAR | Status: AC
  Filled 2016-02-20: qty 10

## 2016-02-20 NOTE — Progress Notes (Addendum)
Patient ambulates 100 ft with moderate assistance on 2L O2 using a front wheel walker. Gait belt on. Patient unsteady and SOB. States no dizzyiness or chest pain. O2 saturation 92% on 2L nasal cannula. Patient to bedside. At rest O2 saturation 97-100%. Patient states tired from walk. Tolerated fair. Call bell within reach. Will continue to monitor. Wife at the bedside.  Domingo Dimes RN

## 2016-02-20 NOTE — Procedures (Signed)
   US guided Left thoracentesis  1.5L Bloody fluid Sent for labs per MD  cxr pending

## 2016-02-20 NOTE — Progress Notes (Signed)
  Echocardiogram 2D Echocardiogram has been performed.  Travis Medina 02/20/2016, 10:20 AM

## 2016-02-20 NOTE — Progress Notes (Signed)
PROGRESS NOTE    Travis Medina  IOX:[id number] DOB: Jul 28, 1934 DOA: 02/19/2016 PCP: [name], MD   Outpatient Specialists:     Brief Narrative:  Travis Medina is a 82 y.o. male with medical history significant of lung cancer- treated with chemo and radiation. Comes with a few week history of worsening SOB. Every day was slightly worse. No fever, no chills. He was seen by his PCP today for the SOB. His O2 sats were stable on RA, but chest x ray showed a complete white out on left side with mediastinal shift to right. He was sent to the ER for evaluation and CT scan.  No large recent weight loss Decrease in appetite   Assessment & Plan:   Active Problems:   Essential hypertension   Lung mass   Primary cancer of left upper lobe of lung (HCC)   Pleural effusion   Hyponatremia   CKD (chronic kidney disease)   Hyponatremia -improving  Pleural effusion -s/p thoracentesis- exudative -still very symptomatic -suspect cancer re-occurrence-await cytology  -doubt PNA- no fever, no wBC -still very symptomatic-- requiring O2-- IR consult for another thoracentesis vs pig tail catheter Rash on forehead -improved with steroids  HTN -lisinopril  CKD- stage III -at baseline   DVT prophylaxis:  SCD's  Code Status: Full Code   Family Communication: Wife/son at bedside  Disposition Plan:     Consultants:   IR  Procedures:        Subjective: Still very short of breath  Objective: Filed Vitals:   02/20/16 1100 02/20/16 1129 02/20/16 1152 02/20/16 1407  BP: 114/67 118/73 109/94 100/66  Pulse:    76  Temp:    97.8 F (36.6 C)  TempSrc:    Oral  Resp:    24  Height:      Weight:      SpO2:    100%   No intake or output data in the 24 hours ending 02/20/16 1706 Filed Weights   02/19/16 1547 02/19/16 1955  Weight: 87.232 kg (192 lb 5 oz) 85.911 kg (189 lb 6.4 oz)    Examination:  General exam: pale appearing  Respiratory system:  diminished on left, no increased work of breathing Cardiovascular system: S1 & S2 heard, RRR. No JVD, murmurs, rubs, gallops or clicks. No pedal edema. Gastrointestinal system: Abdomen is nondistended, soft and nontender. No organomegaly or masses felt. Normal bowel sounds heard.     Data Reviewed: I have personally reviewed following labs and imaging studies  CBC:  Recent Labs Lab 02/19/16 1557 02/20/16 0407  WBC 9.5 9.5  HGB 9.9* 9.5*  HCT 28.7* 29.2*  MCV 92.9 94.2  PLT 269 268   Basic Metabolic Panel:  Recent Labs Lab 02/19/16 1557 02/20/16 0407  NA 126* 133*  K 4.8 5.4*  CL 97* 101  CO2 20* 24  GLUCOSE 116* 110*  BUN 29* 26*  CREATININE 1.42* 1.41*  CALCIUM 9.2 9.3   GFR: Estimated Creatinine Clearance: 43.9 mL/min (by C-G formula based on Cr of 1.41). Liver Function Tests:  Recent Labs Lab 02/19/16 1557 02/20/16 0407  AST 22 19  ALT 25 21  ALKPHOS 88 79  BILITOT 0.6 0.5  PROT 6.8 6.2*  ALBUMIN 3.3* 2.8*   No results for input(s): LIPASE, AMYLASE in the last 168 hours. No results for input(s): AMMONIA in the last 168 hours. Coagulation Profile:  Recent Labs Lab 02/19/16 1638  INR 1.17   Cardiac Enzymes: No results for input(s): CKTOTAL, CKMB, CKMBINDEX, TROPONINI  in the last 168 hours. BNP (last 3 results) No results for input(s): PROBNP in the last 8760 hours. HbA1C: No results for input(s): HGBA1C in the last 72 hours. CBG: No results for input(s): GLUCAP in the last 168 hours. Lipid Profile: No results for input(s): CHOL, HDL, LDLCALC, TRIG, CHOLHDL, LDLDIRECT in the last 72 hours. Thyroid Function Tests: No results for input(s): TSH, T4TOTAL, FREET4, T3FREE, THYROIDAB in the last 72 hours. Anemia Panel: No results for input(s): VITAMINB12, FOLATE, FERRITIN, TIBC, IRON, RETICCTPCT in the last 72 hours. Urine analysis:    Component Value Date/Time   COLORURINE YELLOW 10/24/2014 0509   APPEARANCEUR CLEAR 10/24/2014 0509   LABSPEC  1.013 10/24/2014 0509   PHURINE 5.5 10/24/2014 0509   GLUCOSEU NEGATIVE 10/24/2014 0509   HGBUR TRACE* 10/24/2014 0509   BILIRUBINUR NEGATIVE 10/24/2014 0509   KETONESUR NEGATIVE 10/24/2014 0509   PROTEINUR NEGATIVE 10/24/2014 0509   UROBILINOGEN 0.2 10/24/2014 0509   NITRITE NEGATIVE 10/24/2014 0509   LEUKOCYTESUR NEGATIVE 10/24/2014 0509   Sepsis Labs: '@LABRCNTIP'$ (procalcitonin:4,lacticidven:4)  )No results found for this or any previous visit (from the past 240 hour(s)).    Anti-infectives    None       Radiology Studies: Dg Chest 1 View  02/20/2016  CLINICAL DATA:  Status post left-sided thoracentesis. EXAM: CHEST 1 VIEW COMPARISON:  Radiograph of February 19, 2016. FINDINGS: Cardiomegaly is noted. No pneumothorax is seen status post left-sided thoracentesis. Left pleural effusion is slightly smaller compared to prior exam, although large effusion remains with associated atelectasis or inflammation in left lower lobe. Right lung is unremarkable. Bony thorax is unremarkable. IMPRESSION: Slightly improved left pleural effusion status post left-sided thoracentesis. No pneumothorax is noted. Electronically Signed   By: Marijo Conception, M.D.   On: 02/20/2016 13:09   Dg Chest 2 View  02/19/2016  CLINICAL DATA:  Shortness of breath worsening with exertion EXAM: CHEST  2 VIEW COMPARISON:  CT chest 02/19/2016 FINDINGS: There is a near complete opacification of the left thorax likely reflecting a combination of large pleural effusion, atelectatic lung and possible left upper lobe mass. There is mild interstitial thickening of the right lung. There is no pneumothorax. The heart and mediastinal contours are unremarkable. The osseous structures are unremarkable. IMPRESSION: Near complete opacification of the left thorax likely reflecting a combination of large pleural effusion, atelectatic lung and possible left upper lobe mass. Electronically Signed   By: Kathreen Devoid   On: 02/19/2016 18:17   Ct  Chest W Contrast  02/19/2016  CLINICAL DATA:  Evaluate left lung pleural effusion identified on chest radiograph. Progressive shortness of breath. EXAM: CT CHEST WITH CONTRAST TECHNIQUE: Multidetector CT imaging of the chest was performed during intravenous contrast administration. CONTRAST:  35m ISOVUE-300 IOPAMIDOL (ISOVUE-300) INJECTION 61% COMPARISON:  12/29/2015 FINDINGS: Mediastinum/Lymph Nodes: The heart size appears normal. Aortic atherosclerosis identified. Calcification in the LAD coronary artery is identified. The trachea appears patent and is midline. Normal appearance of the esophagus. Sub- carinal lymph node measures 1.2 cm, image 67 of series 3. Previously this measured 7 mm. Lungs/Pleura: There is a very large left pleural effusion with near complete atelectasis and consolidation of the left lung accounting for the white out appearance of the left hemi thorax on chest radiograph. Subtle, small areas of suspected pleural nodularity are identified compatible with malignant effusion in trans pleural spread of tumor. Index lesion anteriorly measures 2.4 by 0.9 cm, image 89 of series 3. Posterior medially there is a pleural nodule measuring 2.2  by 0.7 cm, image 138 of series 3. Soft tissue attenuating mass within the left upper lobe measures 2.2 x 2.4 cm and is scan Phoebe Sharps for recurrent tumor. Chronic interstitial changes are again noted involving the right lung. Areas of traction bronchiectasis, interstitial reticulation and subpleural honeycombing are noted. Upper abdomen: No acute findings noted. The visualized portions of the liver, spleen, and adrenal glands are unremarkable. Musculoskeletal: No chest wall mass or suspicious bone lesions identified. Multi level spondylosis is identified within the thoracic spine. There is no aggressive lytic or sclerotic bone lesion identified. Chronic appearing wedge deformity involving the lower thoracic vertebra appears unchanged from previous study.  IMPRESSION: 1. Interval development of large left pleural effusion. 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 2. Left upper lobe lung mass is identified and worrisome for tumor recurrence. Consider further evaluation with PET-CT is advised. 3. Aortic atherosclerosis and coronary artery calcifications. Electronically Signed   By: Kerby Moors M.D.   On: 02/19/2016 17:48   US Thoracentesis Asp Pleural Space W/img Guide  02/20/2016  INDICATION: Symptomatic left sided pleural effusion EXAM: US THORACENTESIS ASP PLEURAL SPACE W/IMG GUIDE COMPARISON:  None. MEDICATIONS: 10 cc 1% lidocaine COMPLICATIONS: None immediate. TECHNIQUE: Informed written consent was obtained from the patient after a discussion of the risks, benefits and alternatives to treatment. A timeout was performed prior to the initiation of the procedure. Initial ultrasound scanning demonstrates a left pleural effusion. The lower chest was prepped and draped in the usual sterile fashion. 1% lidocaine was used for local anesthesia. Under direct ultrasound guidance, a 19 gauge, 7-cm, Yueh catheter was introduced. An ultrasound image was saved for documentation purposes. The thoracentesis was performed. The catheter was removed and a dressing was applied. The patient tolerated the procedure well without immediate post procedural complication. The patient was escorted to have an upright chest radiograph. FINDINGS: A total of approximately 1.5 liters of bloody fluid was removed. Requested samples were sent to the laboratory. IMPRESSION: Successful ultrasound-guided left sided thoracentesis yielding 1.5 liters of pleural fluid. Read by:  Lavonia Drafts [facility] Electronically Signed   By: Sandi Mariscal M.D.   On: 02/20/2016 13:01        Scheduled Meds: . heparin  5,000 Units Subcutaneous Q8H  . hydrocortisone   Topical BID  . lidocaine (PF)      . lisinopril  20 mg Oral QHS  . sodium chloride flush  3 mL Intravenous Q12H    . sodium chloride flush  3 mL Intravenous Q12H   Continuous Infusions:       Time spent: 25 min    [place], DO [facility] Pager [phone]  If 7PM-7AM, please contact night-coverage [email or web address] Password [facility] 02/20/2016, 5:06 PM

## 2016-02-21 DIAGNOSIS — R918 Other nonspecific abnormal finding of lung field: Secondary | ICD-10-CM | POA: Diagnosis not present

## 2016-02-21 DIAGNOSIS — N189 Chronic kidney disease, unspecified: Secondary | ICD-10-CM | POA: Diagnosis not present

## 2016-02-21 DIAGNOSIS — J90 Pleural effusion, not elsewhere classified: Secondary | ICD-10-CM | POA: Diagnosis not present

## 2016-02-21 DIAGNOSIS — E871 Hypo-osmolality and hyponatremia: Secondary | ICD-10-CM | POA: Diagnosis not present

## 2016-02-21 LAB — CBC
HCT: 29.3 % — ABNORMAL LOW (ref 39.0–52.0)
Hemoglobin: 9.6 g/dL — ABNORMAL LOW (ref 13.0–17.0)
MCH: 31 pg (ref 26.0–34.0)
MCHC: 32.8 g/dL (ref 30.0–36.0)
MCV: 94.5 fL (ref 78.0–100.0)
PLATELETS: 272 10*3/uL (ref 150–400)
RBC: 3.1 MIL/uL — ABNORMAL LOW (ref 4.22–5.81)
RDW: 12.1 % (ref 11.5–15.5)
WBC: 8.7 10*3/uL (ref 4.0–10.5)

## 2016-02-21 LAB — BASIC METABOLIC PANEL
ANION GAP: 7 (ref 5–15)
BUN: 24 mg/dL — AB (ref 6–20)
CALCIUM: 9 mg/dL (ref 8.9–10.3)
CO2: 21 mmol/L — AB (ref 22–32)
CREATININE: 1.24 mg/dL (ref 0.61–1.24)
Chloride: 103 mmol/L (ref 101–111)
GFR calc Af Amer: >60 mL/min (ref >60)
GFR, EST NON AFRICAN AMERICAN: 54 mL/min — AB (ref >60)
GLUCOSE: 107 mg/dL — AB (ref 65–99)
Potassium: 5.3 mmol/L — ABNORMAL HIGH (ref 3.5–5.1)
Sodium: 131 mmol/L — ABNORMAL LOW (ref 135–145)

## 2016-02-21 NOTE — Progress Notes (Signed)
PROGRESS NOTE    Travis Medina  ZHG:[id number] DOB: 10/05/1933 DOA: 02/19/2016 PCP: [name], MD   Outpatient Specialists:     Brief Narrative:  Travis Medina is a 82 y.o. male with medical history significant of lung cancer- treated with chemo and radiation. Comes with a few week history of worsening SOB. Every day was slightly worse. No fever, no chills. He was seen by his PCP today for the SOB. His O2 sats were stable on RA, but chest x ray showed a complete white out on left side with mediastinal shift to right. He was sent to the ER for evaluation and CT scan.  No large recent weight loss Decrease in appetite   Assessment & Plan:   Active Problems:   Essential hypertension   Lung mass   Primary cancer of left upper lobe of lung (HCC)   Pleural effusion   Hyponatremia   CKD (chronic kidney disease)   Hyponatremia -stable  Pleural effusion -s/p thoracentesis- exudative when Light's criteria calculated -very symptomatic-- hard to talk -suspect cancer re-occurrence-await cytology  -doubt PNA- no fever, no wBC -await Dr. Peggye Ley input  Rash on forehead -improved with steroids  HTN -lisinopril  CKD- stage III -at baseline   DVT prophylaxis:  SCD's  Code Status: Full Code   Family Communication: Wife at bedside  Disposition Plan:     Consultants:   IR  Procedures:        Subjective: Walking better but very short of breath with conversation  Objective: Filed Vitals:   02/21/16 0500 02/21/16 1030 02/21/16 1045 02/21/16 1405  BP: 92/56   106/56  Pulse: 76   81  Temp: 98.4 F (36.9 C)   98.4 F (36.9 C)  TempSrc: Oral   Oral  Resp: 18   20  Height:      Weight:      SpO2: 100% 99% 93% 96%    Intake/Output Summary (Last 24 hours) at 02/21/16 1503 Last data filed at 02/21/16 1408  Gross per 24 hour  Intake    600 ml  Output      0 ml  Net    600 ml   Filed Weights   02/19/16 1547 02/19/16 1955  Weight: 87.232  kg (192 lb 5 oz) 85.911 kg (189 lb 6.4 oz)    Examination:  General exam: pale appearing  Respiratory system: diminished on left to around mid-lung, no increased work of breathing Cardiovascular system: S1 & S2 heard, RRR. No JVD, murmurs, rubs, gallops or clicks. No pedal edema. Gastrointestinal system: Abdomen is nondistended, soft and nontender. No organomegaly or masses felt. Normal bowel sounds heard.     Data Reviewed: I have personally reviewed following labs and imaging studies  CBC:  Recent Labs Lab 02/19/16 1557 02/20/16 0407 02/21/16 0250  WBC 9.5 9.5 8.7  HGB 9.9* 9.5* 9.6*  HCT 28.7* 29.2* 29.3*  MCV 92.9 94.2 94.5  PLT 269 281 196   Basic Metabolic Panel:  Recent Labs Lab 02/19/16 1557 02/20/16 0407 02/21/16 0250  NA 126* 133* 131*  K 4.8 5.4* 5.3*  CL 97* 101 103  CO2 20* 24 21*  GLUCOSE 116* 110* 107*  BUN 29* 26* 24*  CREATININE 1.42* 1.41* 1.24  CALCIUM 9.2 9.3 9.0   GFR: Estimated Creatinine Clearance: 49.9 mL/min (by C-G formula based on Cr of 1.24). Liver Function Tests:  Recent Labs Lab 02/19/16 1557 02/20/16 0407  AST 22 19  ALT 25 21  ALKPHOS 88 79  BILITOT 0.6 0.5  PROT 6.8 6.2*  ALBUMIN 3.3* 2.8*   No results for input(s): LIPASE, AMYLASE in the last 168 hours. No results for input(s): AMMONIA in the last 168 hours. Coagulation Profile:  Recent Labs Lab 02/19/16 1638  INR 1.17   Cardiac Enzymes: No results for input(s): CKTOTAL, CKMB, CKMBINDEX, TROPONINI in the last 168 hours. BNP (last 3 results) No results for input(s): PROBNP in the last 8760 hours. HbA1C: No results for input(s): HGBA1C in the last 72 hours. CBG: No results for input(s): GLUCAP in the last 168 hours. Lipid Profile: No results for input(s): CHOL, HDL, LDLCALC, TRIG, CHOLHDL, LDLDIRECT in the last 72 hours. Thyroid Function Tests: No results for input(s): TSH, T4TOTAL, FREET4, T3FREE, THYROIDAB in the last 72 hours. Anemia Panel: No results  for input(s): VITAMINB12, FOLATE, FERRITIN, TIBC, IRON, RETICCTPCT in the last 72 hours. Urine analysis:    Component Value Date/Time   COLORURINE YELLOW 10/24/2014 0509   APPEARANCEUR CLEAR 10/24/2014 0509   LABSPEC 1.013 10/24/2014 0509   PHURINE 5.5 10/24/2014 0509   GLUCOSEU NEGATIVE 10/24/2014 0509   HGBUR TRACE* 10/24/2014 0509   BILIRUBINUR NEGATIVE 10/24/2014 0509   KETONESUR NEGATIVE 10/24/2014 0509   PROTEINUR NEGATIVE 10/24/2014 0509   UROBILINOGEN 0.2 10/24/2014 0509   NITRITE NEGATIVE 10/24/2014 0509   LEUKOCYTESUR NEGATIVE 10/24/2014 0509     Recent Results (from the past 240 hour(s))  Gram stain     Status: None   Collection Time: 02/20/16 11:59 AM  Result Value Ref Range Status   Specimen Description FLUID PLEURAL LEFT  Final   Special Requests NONE  Final   Gram Stain   Final    FEW WBC PRESENT,BOTH PMN AND MONONUCLEAR NO ORGANISMS SEEN    Report Status 02/20/2016 FINAL  Final      Anti-infectives    None       Radiology Studies: Dg Chest 1 View  02/20/2016  CLINICAL DATA:  Status post left-sided thoracentesis. EXAM: CHEST 1 VIEW COMPARISON:  Radiograph of February 19, 2016. FINDINGS: Cardiomegaly is noted. No pneumothorax is seen status post left-sided thoracentesis. Left pleural effusion is slightly smaller compared to prior exam, although large effusion remains with associated atelectasis or inflammation in left lower lobe. Right lung is unremarkable. Bony thorax is unremarkable. IMPRESSION: Slightly improved left pleural effusion status post left-sided thoracentesis. No pneumothorax is noted. Electronically Signed   By: Marijo Conception, M.D.   On: 02/20/2016 13:09   Dg Chest 2 View  02/20/2016  CLINICAL DATA:  82 year old male with shortness of breath for 3 weeks. Patient with lung cancer status post chemotherapy and radiation. EXAM: CHEST  2 VIEW COMPARISON:  02/20/2016 and prior studies. FINDINGS: Cardiomegaly and mediastinal fullness again noted. A  large left pleural effusion is again noted. Scattered interstitial opacities bilaterally are again identified. There is no evidence of pneumothorax. IMPRESSION: Unchanged appearance of the chest with large left pleural effusion and bilateral interstitial opacities. Electronically Signed   By: Margarette Canada M.D.   On: 02/20/2016 17:16   Dg Chest 2 View  02/19/2016  CLINICAL DATA:  Shortness of breath worsening with exertion EXAM: CHEST  2 VIEW COMPARISON:  CT chest 02/19/2016 FINDINGS: There is a near complete opacification of the left thorax likely reflecting a combination of large pleural effusion, atelectatic lung and possible left upper lobe mass. There is mild interstitial thickening of the right lung. There is no pneumothorax. The heart and mediastinal contours are unremarkable. The osseous structures are  unremarkable. IMPRESSION: Near complete opacification of the left thorax likely reflecting a combination of large pleural effusion, atelectatic lung and possible left upper lobe mass. Electronically Signed   By: Kathreen Devoid   On: 02/19/2016 18:17   Ct Chest W Contrast  02/19/2016  CLINICAL DATA:  Evaluate left lung pleural effusion identified on chest radiograph. Progressive shortness of breath. EXAM: CT CHEST WITH CONTRAST TECHNIQUE: Multidetector CT imaging of the chest was performed during intravenous contrast administration. CONTRAST:  39m ISOVUE-300 IOPAMIDOL (ISOVUE-300) INJECTION 61% COMPARISON:  12/29/2015 FINDINGS: Mediastinum/Lymph Nodes: The heart size appears normal. Aortic atherosclerosis identified. Calcification in the LAD coronary artery is identified. The trachea appears patent and is midline. Normal appearance of the esophagus. Sub- carinal lymph node measures 1.2 cm, image 67 of series 3. Previously this measured 7 mm. Lungs/Pleura: There is a very large left pleural effusion with near complete atelectasis and consolidation of the left lung accounting for the white out appearance of  the left hemi thorax on chest radiograph. Subtle, small areas of suspected pleural nodularity are identified compatible with malignant effusion in trans pleural spread of tumor. Index lesion anteriorly measures 2.4 by 0.9 cm, image 89 of series 3. Posterior medially there is a pleural nodule measuring 2.2 by 0.7 cm, image 138 of series 3. Soft tissue attenuating mass within the left upper lobe measures 2.2 x 2.4 cm and is scan SPhoebe Sharpsfor recurrent tumor. Chronic interstitial changes are again noted involving the right lung. Areas of traction bronchiectasis, interstitial reticulation and subpleural honeycombing are noted. Upper abdomen: No acute findings noted. The visualized portions of the liver, spleen, and adrenal glands are unremarkable. Musculoskeletal: No chest wall mass or suspicious bone lesions identified. Multi level spondylosis is identified within the thoracic spine. There is no aggressive lytic or sclerotic bone lesion identified. Chronic appearing wedge deformity involving the lower thoracic vertebra appears unchanged from previous study. IMPRESSION: 1. Interval development of large left pleural effusion. 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 2. Left upper lobe lung mass is identified and worrisome for tumor recurrence. Consider further evaluation with PET-CT is advised. 3. Aortic atherosclerosis and coronary artery calcifications. Electronically Signed   By: TKerby MoorsM.D.   On: 02/19/2016 17:48   UKoreaThoracentesis Asp Pleural Space W/img Guide  02/20/2016  INDICATION: Symptomatic left sided pleural effusion EXAM: [place]ORACENTESIS ASP PLEURAL SPACE W/IMG GUIDE COMPARISON:  None. MEDICATIONS: 10 cc 1% lidocaine COMPLICATIONS: None immediate. TECHNIQUE: Informed written consent was obtained from the patient after a discussion of the risks, benefits and alternatives to treatment. A timeout was performed prior to the initiation of the procedure. Initial  ultrasound scanning demonstrates a left pleural effusion. The lower chest was prepped and draped in the usual sterile fashion. 1% lidocaine was used for local anesthesia. Under direct ultrasound guidance, a 19 gauge, 7-cm, Yueh catheter was introduced. An ultrasound image was saved for documentation purposes. The thoracentesis was performed. The catheter was removed and a dressing was applied. The patient tolerated the procedure well without immediate post procedural complication. The patient was escorted to have an upright chest radiograph. FINDINGS: A total of approximately 1.5 liters of bloody fluid was removed. Requested samples were sent to the laboratory. IMPRESSION: Successful ultrasound-guided left sided thoracentesis yielding 1.5 liters of pleural fluid. Read by:  PLavonia DraftsPSt Elizabeth Physicians Endoscopy CenterElectronically Signed   By: JSandi MariscalM.D.   On: 02/20/2016 13:01        Scheduled Meds: .  heparin  5,000 Units Subcutaneous Q8H  . hydrocortisone   Topical BID  . lisinopril  20 mg Oral QHS  . sodium chloride flush  3 mL Intravenous Q12H  . sodium chloride flush  3 mL Intravenous Q12H   Continuous Infusions:       Time spent: 25 min    [place], DO [facility] Pager [phone]  If 7PM-7AM, please contact night-coverage [email or web address] Password [email or web address] 02/21/2016, 3:03 PM

## 2016-02-21 NOTE — Progress Notes (Addendum)
Patient ID: Travis Medina, male   DOB: 1933/12/25, 82 y.o.   MRN: [id number]   Aware of PleurX catheter request Discussed with Dr Pascal Lux  Will need to await cyto results from 7/15 to prove malignant Will need Oncology to be sure no other treatment options  Will need Palliative consult Will need to have another thora or 2 to determine rate of accumulation  Could plan for another thora for Mon 7/16 Will await consults and cyto result  Procedure can be performed as OP  Spoke to Dr Eliseo Squires

## 2016-02-21 NOTE — Progress Notes (Signed)
Pt. Ambulates 134f with minimal assistance on RA using a front wheel walker. Gait belt on. Patient steady, slightly SOB. States no dizziness or CP. O2 saturation 93-94% on RA. Patient to bedside. At rest saturation 99%. Patient states feels much better then yesterday. Tolerated well. Call bell with reach. Will continue to monitor. Wife at bedside.  LDomingo DimesRN

## 2016-02-22 ENCOUNTER — Other Ambulatory Visit: Payer: Self-pay | Admitting: *Deleted

## 2016-02-22 ENCOUNTER — Observation Stay (HOSPITAL_COMMUNITY): Payer: Medicare Other

## 2016-02-22 DIAGNOSIS — J90 Pleural effusion, not elsewhere classified: Secondary | ICD-10-CM | POA: Diagnosis not present

## 2016-02-22 DIAGNOSIS — N189 Chronic kidney disease, unspecified: Secondary | ICD-10-CM | POA: Diagnosis not present

## 2016-02-22 DIAGNOSIS — R918 Other nonspecific abnormal finding of lung field: Secondary | ICD-10-CM | POA: Diagnosis not present

## 2016-02-22 DIAGNOSIS — E871 Hypo-osmolality and hyponatremia: Secondary | ICD-10-CM | POA: Diagnosis not present

## 2016-02-22 MED ORDER — LIDOCAINE HCL (PF) 1 % IJ SOLN
INTRAMUSCULAR | Status: AC
  Filled 2016-02-22: qty 10

## 2016-02-22 NOTE — Progress Notes (Signed)
SATURATION QUALIFICATIONS: (This note is used to comply with regulatory documentation for home oxygen)  Patient Saturations on Room Air at Rest = 95%  Patient Saturations on Room Air while Ambulating =93%   Pt walked 141f feet on RA, stats remained in the 90s 92-93%.

## 2016-02-22 NOTE — Procedures (Signed)
Successful US guided left thoracentesis. Yielded 2.0 L of bloody pleural fluid. Pt tolerated procedure well. No immediate complications.  Specimen was not sent for labs. CXR ordered.  Ascencion Dike PA-C 02/22/2016 11:23 AM

## 2016-02-22 NOTE — Progress Notes (Signed)
Per MD, pt to follow up with MD in 1-2 wks.POF sent

## 2016-02-22 NOTE — Discharge Summary (Signed)
Physician Discharge Summary  Travis Medina ZDG:[id number] DOB: Jun 01, 1934 DOA: 02/19/2016  PCP: [name], MD  Admit date: 02/19/2016 Discharge date: 02/22/2016   Recommendations for Outpatient Follow-Up:   BMP 1 week Needs close follow up with Dr. Mckinley Jewel-- cytology results pending  Discharge Diagnosis:   Active Problems:   Essential hypertension   Lung mass   Primary cancer of left upper lobe of lung ([place])   Pleural effusion   Hyponatremia   CKD (chronic kidney disease)   Discharge disposition:  Home.  Discharge Condition: Improved.  Diet recommendation: Low sodium, heart healthy  Wound care: None.   History of Present Illness:   Travis Medina is a 82 y.o. male with medical history significant of lung cancer- treated with chemo and radiation. Comes with a few week history of worsening SOB. Every day was slightly worse. No fever, no chills. He was seen by his PCP today for the SOB. His O2 sats were stable on RA, but chest x ray showed a complete white out on left side with mediastinal shift to right. He was sent to the ER for evaluation and CT scan.  No large recent weight loss Decrease in appetite   Hospital Course by Problem:   Hyponatremia -stable  Pleural effusion -s/p thoracentesis- exudative when Light's criteria calculated -total of 3.4 L removed-- finally better today -suspect cancer re-occurrence-await cytology  -doubt PNA- no fever, no wBC -await Dr. Peggye Ley input  Rash on forehead -improved with topical steroids ? excema  HTN -lisinopril  CKD- stage III -at baseline    Medical Consultants:   IR oncology  Discharge Exam:   Filed Vitals:   02/22/16 1112 02/22/16 1413  BP: 109/56 112/54  Pulse:  79  Temp:  97.4 F (36.3 C)  Resp:  16   Filed Vitals:   02/22/16 1053 02/22/16 1109 02/22/16 1112 02/22/16 1413  BP: 115/61 109/62 109/56 112/54  Pulse:    79  Temp:    97.4 F (36.3 C)  TempSrc:    Oral  Resp:     16  Height:      Weight:      SpO2:    98%    Gen:  NAD Moving more air   The results of significant diagnostics from this hospitalization (including imaging, microbiology, ancillary and laboratory) are listed below for reference.     Procedures and Diagnostic Studies:   Dg Chest 1 View  02/20/2016  CLINICAL DATA:  Status post left-sided thoracentesis. EXAM: CHEST 1 VIEW COMPARISON:  Radiograph of February 19, 2016. FINDINGS: Cardiomegaly is noted. No pneumothorax is seen status post left-sided thoracentesis. Left pleural effusion is slightly smaller compared to prior exam, although large effusion remains with associated atelectasis or inflammation in left lower lobe. Right lung is unremarkable. Bony thorax is unremarkable. IMPRESSION: Slightly improved left pleural effusion status post left-sided thoracentesis. No pneumothorax is noted. Electronically Signed   By: Marijo Conception, M.D.   On: 02/20/2016 13:09   Dg Chest 2 View  02/20/2016  CLINICAL DATA:  82 year old male with shortness of breath for 3 weeks. Patient with lung cancer status post chemotherapy and radiation. EXAM: CHEST  2 VIEW COMPARISON:  02/20/2016 and prior studies. FINDINGS: Cardiomegaly and mediastinal fullness again noted. A large left pleural effusion is again noted. Scattered interstitial opacities bilaterally are again identified. There is no evidence of pneumothorax. IMPRESSION: Unchanged appearance of the chest with large left pleural effusion and bilateral interstitial opacities. Electronically Signed   By: Dellis Filbert  Hu M.D.   On: 02/20/2016 17:16   Dg Chest 2 View  02/19/2016  CLINICAL DATA:  Shortness of breath worsening with exertion EXAM: CHEST  2 VIEW COMPARISON:  CT chest 02/19/2016 FINDINGS: There is a near complete opacification of the left thorax likely reflecting a combination of large pleural effusion, atelectatic lung and possible left upper lobe mass. There is mild interstitial thickening of the right lung.  There is no pneumothorax. The heart and mediastinal contours are unremarkable. The osseous structures are unremarkable. IMPRESSION: Near complete opacification of the left thorax likely reflecting a combination of large pleural effusion, atelectatic lung and possible left upper lobe mass. Electronically Signed   By: Kathreen Devoid   On: 02/19/2016 18:17   Ct Chest W Contrast  02/19/2016  CLINICAL DATA:  Evaluate left lung pleural effusion identified on chest radiograph. Progressive shortness of breath. EXAM: CT CHEST WITH CONTRAST TECHNIQUE: Multidetector CT imaging of the chest was performed during intravenous contrast administration. CONTRAST:  80m ISOVUE-300 IOPAMIDOL (ISOVUE-300) INJECTION 61% COMPARISON:  12/29/2015 FINDINGS: Mediastinum/Lymph Nodes: The heart size appears normal. Aortic atherosclerosis identified. Calcification in the LAD coronary artery is identified. The trachea appears patent and is midline. Normal appearance of the esophagus. Sub- carinal lymph node measures 1.2 cm, image 67 of series 3. Previously this measured 7 mm. Lungs/Pleura: There is a very large left pleural effusion with near complete atelectasis and consolidation of the left lung accounting for the white out appearance of the left hemi thorax on chest radiograph. Subtle, small areas of suspected pleural nodularity are identified compatible with malignant effusion in trans pleural spread of tumor. Index lesion anteriorly measures 2.4 by 0.9 cm, image 89 of series 3. Posterior medially there is a pleural nodule measuring 2.2 by 0.7 cm, image 138 of series 3. Soft tissue attenuating mass within the left upper lobe measures 2.2 x 2.4 cm and is scan SPhoebe Sharpsfor recurrent tumor. Chronic interstitial changes are again noted involving the right lung. Areas of traction bronchiectasis, interstitial reticulation and subpleural honeycombing are noted. Upper abdomen: No acute findings noted. The visualized portions of the liver, spleen,  and adrenal glands are unremarkable. Musculoskeletal: No chest wall mass or suspicious bone lesions identified. Multi level spondylosis is identified within the thoracic spine. There is no aggressive lytic or sclerotic bone lesion identified. Chronic appearing wedge deformity involving the lower thoracic vertebra appears unchanged from previous study. IMPRESSION: 1. Interval development of large left pleural effusion. 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 2. Left upper lobe lung mass is identified and worrisome for tumor recurrence. Consider further evaluation with PET-CT is advised. 3. Aortic atherosclerosis and coronary artery calcifications. Electronically Signed   By: TKerby MoorsM.D.   On: 02/19/2016 17:48   [place]orea[place]oracentesis Asp Pleural Space W/img Guide  02/20/2016  INDICATION: Symptomatic left sided pleural effusion EXAM: [place]ORACENTESIS ASP PLEURAL SPACE W/IMG GUIDE COMPARISON:  None. MEDICATIONS: 10 cc 1% lidocaine COMPLICATIONS: None immediate. TECHNIQUE: Informed written consent was obtained from the patient after a discussion of the risks, benefits and alternatives to treatment. A timeout was performed prior to the initiation of the procedure. Initial ultrasound scanning demonstrates a left pleural effusion. The lower chest was prepped and draped in the usual sterile fashion. 1% lidocaine was used for local anesthesia. Under direct ultrasound guidance, a 19 gauge, 7-cm, Yueh catheter was introduced. An ultrasound image was saved for documentation purposes. The thoracentesis was performed. The catheter was removed and a  dressing was applied. The patient tolerated the procedure well without immediate post procedural complication. The patient was escorted to have an upright chest radiograph. FINDINGS: A total of approximately 1.5 liters of bloody fluid was removed. Requested samples were sent to the laboratory. IMPRESSION: Successful ultrasound-guided left sided  thoracentesis yielding 1.5 liters of pleural fluid. Read by:  Lavonia Drafts [facility] Electronically Signed   By: Sandi Mariscal M.D.   On: 02/20/2016 13:01     Labs:   Basic Metabolic Panel:  Recent Labs Lab 02/19/16 1557 02/20/16 0407 02/21/16 0250  NA 126* 133* 131*  K 4.8 5.4* 5.3*  CL 97* 101 103  CO2 20* 24 21*  GLUCOSE 116* 110* 107*  BUN 29* 26* 24*  CREATININE 1.42* 1.41* 1.24  CALCIUM 9.2 9.3 9.0   GFR Estimated Creatinine Clearance: 49.1 mL/min (by C-G formula based on Cr of 1.24). Liver Function Tests:  Recent Labs Lab 02/19/16 1557 02/20/16 0407  AST 22 19  ALT 25 21  ALKPHOS 88 79  BILITOT 0.6 0.5  PROT 6.8 6.2*  ALBUMIN 3.3* 2.8*   No results for input(s): LIPASE, AMYLASE in the last 168 hours. No results for input(s): AMMONIA in the last 168 hours. Coagulation profile  Recent Labs Lab 02/19/16 1638  INR 1.17    CBC:  Recent Labs Lab 02/19/16 1557 02/20/16 0407 02/21/16 0250  WBC 9.5 9.5 8.7  HGB 9.9* 9.5* 9.6*  HCT 28.7* 29.2* 29.3*  MCV 92.9 94.2 94.5  PLT 269 281 272   Cardiac Enzymes: No results for input(s): CKTOTAL, CKMB, CKMBINDEX, TROPONINI in the last 168 hours. BNP: Invalid input(s): POCBNP CBG: No results for input(s): GLUCAP in the last 168 hours. D-Dimer No results for input(s): DDIMER in the last 72 hours. Hgb A1c No results for input(s): HGBA1C in the last 72 hours. Lipid Profile No results for input(s): CHOL, HDL, LDLCALC, TRIG, CHOLHDL, LDLDIRECT in the last 72 hours. Thyroid function studies No results for input(s): TSH, T4TOTAL, T3FREE, THYROIDAB in the last 72 hours.  Invalid input(s): FREET3 Anemia work up No results for input(s): VITAMINB12, FOLATE, FERRITIN, TIBC, IRON, RETICCTPCT in the last 72 hours. Microbiology Recent Results (from the past 240 hour(s))  Culture, body fluid-bottle     Status: None (Preliminary result)   Collection Time: 02/20/16 11:59 AM  Result Value Ref Range Status   Specimen  Description FLUID PLEURAL LEFT  Final   Special Requests NONE  Final   Culture NO GROWTH 2 DAYS  Final   Report Status PENDING  Incomplete  Gram stain     Status: None   Collection Time: 02/20/16 11:59 AM  Result Value Ref Range Status   Specimen Description FLUID PLEURAL LEFT  Final   Special Requests NONE  Final   Gram Stain   Final    FEW WBC PRESENT,BOTH PMN AND MONONUCLEAR NO ORGANISMS SEEN    Report Status 02/20/2016 FINAL  Final     Discharge Instructions:       Discharge Instructions    Call MD for:    Complete by:  As directed   Worsening SOB- may need another thoracentesis vs chest tube     Diet general    Complete by:  As directed      Discharge instructions    Complete by:  As directed   Close follow up with Dr. [name]--- aware patient was in hospital- to follow cytology     Increase activity slowly    Complete by:  As directed             Medication List    TAKE these medications        acetaminophen 500 MG tablet  Commonly known as:  TYLENOL  Take 1 tablet (500 mg total) by mouth every 6 (six) hours as needed for mild pain.     alendronate 70 MG tablet  Commonly known as:  FOSAMAX  Take 70 mg by mouth every 14 (fourteen) days. Last dose 02/13/16     aspirin EC 81 MG tablet  Take 81 mg by mouth at bedtime.     lisinopril 20 MG tablet  Commonly known as:  PRINIVIL,ZESTRIL  Take 20 mg by mouth at bedtime.          Time coordinating discharge: 35 min  Signed:  JESSICA U VANN   [facility] 02/22/2016, 2:15 PM

## 2016-02-22 NOTE — Care Management Obs Status (Signed)
[place] NOTIFICATION   Patient Details  Name: Rollan Roger MRN: [id number] Date of Birth: [birth year]   Medicare Observation Status Notification Given:  Yes    Dawayne Patricia, RN 02/22/2016, 1:13 PM

## 2016-02-23 ENCOUNTER — Encounter: Payer: Self-pay | Admitting: *Deleted

## 2016-02-23 DIAGNOSIS — J90 Pleural effusion, not elsewhere classified: Secondary | ICD-10-CM

## 2016-02-23 NOTE — Progress Notes (Signed)
Oncology Nurse Navigator Documentation  Oncology Nurse Navigator Flowsheets 02/23/2016  Navigator Encounter Type Clinic/MDC  Patient Visit Type MedOnc  Treatment Phase Other  Barriers/Navigation Needs Coordination of Care  Interventions Coordination of Care  Coordination of Care Appts  Acuity Level 2  Acuity Level 2 Assistance expediting appointments;Educational needs  Time Spent with Patient 30   Patient came here today to speak with someone about needing an appt to see Dr. [name].  I listened as he explained.  I updated Dr. [name].  He stated not urgent to see Dr. [name] in 1 to 2 weeks.  Dr. [name] did give me a verbal order for a referral to thoracic surgery for possible pleur x cath placement.  I completed referral and will call [facility] office.  I updated patient and notified scheduling to call.

## 2016-02-25 ENCOUNTER — Institutional Professional Consult (permissible substitution) (INDEPENDENT_AMBULATORY_CARE_PROVIDER_SITE_OTHER): Payer: Medicare Other | Admitting: Thoracic Surgery (Cardiothoracic Vascular Surgery)

## 2016-02-25 ENCOUNTER — Other Ambulatory Visit: Payer: Self-pay | Admitting: *Deleted

## 2016-02-25 ENCOUNTER — Encounter: Payer: Self-pay | Admitting: *Deleted

## 2016-02-25 ENCOUNTER — Telehealth: Payer: Self-pay | Admitting: Internal Medicine

## 2016-02-25 ENCOUNTER — Encounter: Payer: Self-pay | Admitting: Thoracic Surgery (Cardiothoracic Vascular Surgery)

## 2016-02-25 VITALS — BP 120/67 | HR 92 | Resp 20 | Ht 70.0 in | Wt 188.0 lb

## 2016-02-25 DIAGNOSIS — Z9889 Other specified postprocedural states: Secondary | ICD-10-CM | POA: Diagnosis not present

## 2016-02-25 DIAGNOSIS — J90 Pleural effusion, not elsewhere classified: Secondary | ICD-10-CM

## 2016-02-25 DIAGNOSIS — J948 Other specified pleural conditions: Secondary | ICD-10-CM

## 2016-02-25 LAB — CULTURE, BODY FLUID-BOTTLE

## 2016-02-25 LAB — CULTURE, BODY FLUID W GRAM STAIN -BOTTLE: Culture: NO GROWTH

## 2016-02-25 NOTE — Progress Notes (Signed)
Oncology Nurse Navigator Documentation  Oncology Nurse Navigator Flowsheets 02/25/2016  Navigator Encounter Type Other  Treatment Phase Other  Barriers/Navigation Needs Coordination of Care  Interventions Coordination of Care  Coordination of Care Appts  Acuity Level 1  Acuity Level 1 Minimal follow up required  Time Spent with Patient 15   I noticed [name] is not schedule to see Dr. [name] in 1-2 weeks per POF request placed on 02/23/16.  I completed another urgent POF to be scheduled.

## 2016-02-25 NOTE — Telephone Encounter (Signed)
Spoke with pt to confirm 8/1 appt at 1030 am per 7/20 pof

## 2016-02-25 NOTE — Progress Notes (Signed)
PCP is [name], MD Referring Provider is [name], MD  Chief Complaint  Patient presents with  . Pleural Effusion    Surgical eval for PleurX Cath placement, CXR 02/22/16, HX of thoracentesis    HPI: 82 year old man with a history of small cell lung cancer who is sent for consultation regarding a left pleural effusion.  [name] is an 82 year old man who had a hip fracture back in early 2016. Findings were consistent with a pathologic fracture which led to a workup which ultimately revealed small cell carcinoma. I do not see any pathology from the fracture, nor do I know if that was related. It was ultimately determined that he had limited stage small cell cancer (T3, N2) and he was treated with concurrent chemoradiation. He later had whole brain radiation. He completed his concurrent chemoradiation in June 2016.  He saw Dr. [name] in May and was doing reasonably well at that time.  A few weeks ago he started feeling short of breath. Initially this was with exertion but progressed to the point where he was short of breath at rest. He had a persistent dry cough and subjective wheezing. Finally he got to the point refill acute just could not breathe on 02/19/2016 and he went to the emergency department. He was found to have a white out of his left lung. He was admitted and had thoracentesis on 7/14 which drained 1.5 L of fluid. We then had another thoracentesis on 02/22/2016. 2 L of fluid was drained on that occasion. His chest x-ray improved significantly as to the symptoms.  He has not had recurrence of his symptoms since he was discharged from the hospital. (Only 3 days ago). He has noted poor appetite but has not had any significant weight loss. Cytology on the fluid from 02/19/2016 was negative. I do not see any results from the second thoracentesis.  Zubrod Score: At the time of surgery this patient's most appropriate activity status/level should be described as: '[]'$     0     Normal activity, no symptoms '[x]'$     1    Restricted in physical strenuous activity but ambulatory, able to do out light work '[]'$     2    Ambulatory and capable of self care, unable to do work activities, up and about >50 % of waking hours                              '[]'$     3    Only limited self care, in bed greater than 50% of waking hours '[]'$     4    Completely disabled, no self care, confined to bed or chair '[]'$     5    Moribund   Past Medical History  Diagnosis Date  . Essential hypertension   . Lung cancer ([place])   . S/P chemotherapy, time since 4-12 weeks   . S/P radiation therapy     Past Surgical History  Procedure Laterality Date  . Total knee arthroplasty      bilateral  . Lung biopsy      Family History  Problem Relation Age of Onset  . Heart failure Mother   . Colon cancer Sister     Social History Social History  Substance Use Topics  . Smoking status: Former Smoker -- 1.00 packs/day for 25 years    Types: Cigarettes    Quit date: 08/09/1975  . Smokeless tobacco: Never  Used  . Alcohol Use: No    Current Outpatient Prescriptions  Medication Sig Dispense Refill  . acetaminophen (TYLENOL) 500 MG tablet Take 1 tablet (500 mg total) by mouth every 6 (six) hours as needed for mild pain. (Patient taking differently: Take 1,000 mg by mouth every 6 (six) hours as needed for mild pain. ) 30 tablet 0  . alendronate (FOSAMAX) 70 MG tablet Take 70 mg by mouth every 14 (fourteen) days. Last dose 02/13/16    . aspirin EC 81 MG tablet Take 81 mg by mouth at bedtime.    [name] lisinopril (PRINIVIL,ZESTRIL) 20 MG tablet Take 20 mg by mouth at bedtime.      No current facility-administered medications for this visit.    Allergies  Allergen Reactions  . Morphine And Related Itching and Rash    Review of Systems  Constitutional: Positive for activity change and appetite change. Negative for unexpected weight change.  HENT: Negative for trouble swallowing and voice change.    Respiratory: Positive for cough, shortness of breath and wheezing.   Cardiovascular: Negative for chest pain.  Gastrointestinal: Negative for abdominal pain and blood in stool.  Genitourinary: Negative for hematuria and difficulty urinating.  Musculoskeletal: Positive for joint swelling and arthralgias.  Hematological: Negative for adenopathy. Bruises/bleeds easily.  All other systems reviewed and are negative.   BP 120/67 mmHg  Pulse 92  Resp 20  Ht '5\' 10"'$  (1.778 m)  Wt 188 lb (85.276 kg)  BMI 26.98 kg/m2  SpO2 97% Physical Exam  Constitutional: He is oriented to person, place, and time. No distress.  Elderly  HENT:  Head: Normocephalic and atraumatic.  Eyes: EOM are normal. Pupils are equal, round, and reactive to light.  Pulmonary/Chest: Effort normal. No respiratory distress. He has no wheezes.  Diminished BS left base  Abdominal: Soft. He exhibits no distension. There is no tenderness.  Musculoskeletal: He exhibits no edema.  Neurological: He is alert and oriented to person, place, and time. No cranial nerve deficit.  Walks with cane  Skin: Skin is dry.  Vitals reviewed.    Diagnostic Tests: CLINICAL DATA: Status post left pleural effusion  EXAM: CHEST 1 VIEW  COMPARISON: 02/20/2016  FINDINGS: Moderate left pleural effusion, decreased. No pneumothorax is seen.  Scarring/mass-like opacity in the medial left upper lobe, better evaluated on CT.  Patchy left lower lobe opacity, possibly atelectasis.  Right lung is essentially clear.  The heart is normal in size.  Degenerative changes of the thoracic spine.  IMPRESSION: Moderate left pleural effusion, decreased. No pneumothorax is seen status post thoracentesis.  Scarring/mass-like opacity in the medial left upper lobe, better evaluated on CT.   Electronically Signed  By: Julian Hy M.D.  On: 02/22/2016 11:47  I personally reviewed all the chest x-rays from his recent  admission as well as his most recent CT chest.  Impression:  82 year old man with history of small cell carcinoma who had concurrent chemoradiation completed about a year ago. He also had whole brain irradiation which was completed in December 2016. He presented about 6 days ago with shortness of breath and was found to have a large left pleural effusion. He had 3.5 L of fluid drained off over the course of a couple of days and has had marked symptomatic relief. This is almost certainly a malignant effusion despite the negative cytology on the initial fluid.  I discussed the possibility of placing a pleural catheter for management of the effusion with Mr. and Mrs. Grandville Silos. We discussed  the natural history of malignant pleural effusions. They understand that the catheter placement would be palliative in nature. I described the procedure to them as well as the postoperative management of the drain. I informed him of the indications, risks, benefits, and alternatives. They understand the risks include, but are not limited to bleeding, catheter malposition, catheter occlusion, infection, as well as the possibility of other unforeseeable complications.   He does wish to proceed with pleural catheter placement. He does have some residual fluid present on his chest x-ray from 3 days ago, but catheter placement will be easier if there is a larger effusion at the time of the procedure. I don't want to wait until his lung is completely whited out and he is unable to breathe, so recommended that we schedule him for catheter placement next Thursday, 03/03/2016. We'll plan to do a chest x-ray on 03/02/2016 to make sure there is enough fluid to warrant the procedure.  Plan: Pleural catheter placement on Thursday, 03/03/2016  Melrose Nakayama, MD [facility] [phone]

## 2016-03-01 ENCOUNTER — Encounter (HOSPITAL_COMMUNITY): Payer: Self-pay

## 2016-03-01 NOTE — Pre-Procedure Instructions (Signed)
    CLARIS GUYMON  03/01/2016      [facility] - [place], [place] - [address] [place] [address] Phone: [phone] Fax: [phone]    Your procedure is scheduled on Thurs., July 27  Report to [facility] Admitting at 8:00 A.M.  Call this number if you have problems the morning of surgery:  [phone]   Remember:  Do not eat food or drink liquids after midnight.  Take these medicines the morning of surgery with A SIP OF WATER: tylenol if needed, claritin              Stop aspirin, and NSAIDS: advil, motrin, ibuprofen,aleve,Goody's.   Do not wear jewelry, make-up or nail polish.  Do not wear lotions, powders, or colognes.  You may not wear deoderant.  Men may shave face and neck.  Do not bring valuables to the hospital.  [facility] is not responsible for any belongings or valuables.  Contacts, dentures or bridgework may not be worn into surgery.  Leave your suitcase in the car.  After surgery it may be brought to your room.  For patients admitted to the hospital, discharge time will be determined by your treatment team.  Patients discharged the day of surgery will not be allowed to drive home.     Special instructions:  Preparing for surgery handout  Please read over the following fact sheets that you were given. MRSA Information

## 2016-03-02 ENCOUNTER — Encounter (HOSPITAL_COMMUNITY)
Admission: RE | Admit: 2016-03-02 | Discharge: 2016-03-02 | Disposition: A | Discharge status: Home / Self Care | Payer: Medicare Other | Source: Ambulatory Visit | Attending: Thoracic Surgery (Cardiothoracic Vascular Surgery) | Admitting: Thoracic Surgery (Cardiothoracic Vascular Surgery)

## 2016-03-02 ENCOUNTER — Encounter (HOSPITAL_COMMUNITY): Payer: Self-pay

## 2016-03-02 DIAGNOSIS — R0902 Hypoxemia: Secondary | ICD-10-CM | POA: Diagnosis present

## 2016-03-02 DIAGNOSIS — Z79899 Other long term (current) drug therapy: Secondary | ICD-10-CM | POA: Diagnosis not present

## 2016-03-02 DIAGNOSIS — C3412 Malignant neoplasm of upper lobe, left bronchus or lung: Secondary | ICD-10-CM | POA: Diagnosis present

## 2016-03-02 DIAGNOSIS — Z800 Family history of malignant neoplasm of digestive organs: Secondary | ICD-10-CM | POA: Diagnosis not present

## 2016-03-02 DIAGNOSIS — I10 Essential (primary) hypertension: Secondary | ICD-10-CM | POA: Diagnosis present

## 2016-03-02 DIAGNOSIS — Z7982 Long term (current) use of aspirin: Secondary | ICD-10-CM | POA: Diagnosis not present

## 2016-03-02 DIAGNOSIS — Z87891 Personal history of nicotine dependence: Secondary | ICD-10-CM | POA: Diagnosis not present

## 2016-03-02 DIAGNOSIS — Z96653 Presence of artificial knee joint, bilateral: Secondary | ICD-10-CM | POA: Diagnosis present

## 2016-03-02 DIAGNOSIS — Z9221 Personal history of antineoplastic chemotherapy: Secondary | ICD-10-CM | POA: Diagnosis not present

## 2016-03-02 DIAGNOSIS — Z8249 Family history of ischemic heart disease and other diseases of the circulatory system: Secondary | ICD-10-CM | POA: Diagnosis not present

## 2016-03-02 DIAGNOSIS — Z885 Allergy status to narcotic agent status: Secondary | ICD-10-CM | POA: Diagnosis not present

## 2016-03-02 DIAGNOSIS — Y95 Nosocomial condition: Secondary | ICD-10-CM | POA: Diagnosis present

## 2016-03-02 DIAGNOSIS — J910 Malignant pleural effusion: Secondary | ICD-10-CM | POA: Diagnosis present

## 2016-03-02 DIAGNOSIS — J90 Pleural effusion, not elsewhere classified: Secondary | ICD-10-CM

## 2016-03-02 DIAGNOSIS — Z923 Personal history of irradiation: Secondary | ICD-10-CM | POA: Diagnosis not present

## 2016-03-02 DIAGNOSIS — J189 Pneumonia, unspecified organism: Secondary | ICD-10-CM | POA: Diagnosis present

## 2016-03-02 DIAGNOSIS — R0602 Shortness of breath: Secondary | ICD-10-CM | POA: Diagnosis present

## 2016-03-02 DIAGNOSIS — D6481 Anemia due to antineoplastic chemotherapy: Secondary | ICD-10-CM | POA: Diagnosis present

## 2016-03-02 HISTORY — DX: Unspecified osteoarthritis, unspecified site: M19.90

## 2016-03-02 HISTORY — DX: Reserved for inherently not codable concepts without codable children: IMO0001

## 2016-03-02 HISTORY — DX: Essential (primary) hypertension: I10

## 2016-03-02 HISTORY — DX: Presence of spectacles and contact lenses: Z97.3

## 2016-03-02 LAB — APTT: aPTT: 33 seconds (ref 24–36)

## 2016-03-02 LAB — CBC
HCT: 31.5 % — ABNORMAL LOW (ref 39.0–52.0)
Hemoglobin: 10.1 g/dL — ABNORMAL LOW (ref 13.0–17.0)
MCH: 30.6 pg (ref 26.0–34.0)
MCHC: 32.1 g/dL (ref 30.0–36.0)
MCV: 95.5 fL (ref 78.0–100.0)
PLATELETS: 296 10*3/uL (ref 150–400)
RBC: 3.3 MIL/uL — ABNORMAL LOW (ref 4.22–5.81)
RDW: 12.3 % (ref 11.5–15.5)
WBC: 9.6 10*3/uL (ref 4.0–10.5)

## 2016-03-02 LAB — COMPREHENSIVE METABOLIC PANEL
ALT: 99 U/L — ABNORMAL HIGH (ref 17–63)
AST: 50 U/L — ABNORMAL HIGH (ref 15–41)
Albumin: 3.1 g/dL — ABNORMAL LOW (ref 3.5–5.0)
Alkaline Phosphatase: 87 U/L (ref 38–126)
Anion gap: 6 (ref 5–15)
BILIRUBIN TOTAL: 0.5 mg/dL (ref 0.3–1.2)
BUN: 22 mg/dL — ABNORMAL HIGH (ref 6–20)
CHLORIDE: 104 mmol/L (ref 101–111)
CO2: 24 mmol/L (ref 22–32)
CREATININE: 1.22 mg/dL (ref 0.61–1.24)
Calcium: 9.5 mg/dL (ref 8.9–10.3)
GFR, EST NON AFRICAN AMERICAN: 54 mL/min — AB (ref >60)
Glucose, Bld: 155 mg/dL — ABNORMAL HIGH (ref 65–99)
POTASSIUM: 5.2 mmol/L — AB (ref 3.5–5.1)
Sodium: 134 mmol/L — ABNORMAL LOW (ref 135–145)
TOTAL PROTEIN: 6.6 g/dL (ref 6.5–8.1)

## 2016-03-02 LAB — PROTIME-INR
INR: 1.2
PROTHROMBIN TIME: 15.4 s — AB (ref 11.4–15.2)

## 2016-03-02 LAB — SURGICAL PCR SCREEN
MRSA, PCR: NEGATIVE
Staphylococcus aureus: NEGATIVE

## 2016-03-02 MED ORDER — DEXTROSE 5 % IV SOLN
1.5000 g | INTRAVENOUS | Status: AC
  Administered 2016-03-03: 1.5 g via INTRAVENOUS
  Filled 2016-03-02: qty 1.5

## 2016-03-02 NOTE — Progress Notes (Addendum)
Anesthesia Chart Review: Patient is a 82 year old male scheduled for insertion of left pleural drainage catheter on 03/03/2016 by Dr. Roxan Hockey.  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 On 02/19/16 he presented with worsening shortness of breath and found to have white out of his left lung. He was admitted and underwent thoracentesis on 714/17 (1.5L) and 717/17 (2L).   PCP is Dr. Shon Baton. Oncologist is Dr. [name].  Meds include Tylenol, Fosamax, aspirin 81 mg, lisinopril, loratadine.  02/19/16 EKG: SR, borderline prolonged PR interval, abnormal R wave progression, early transition, baseline wanderer, non-specific ST/T wave abnormality.  02/20/16 Echo: Impressions: - Technically difficult study due to large pleural effusion. EF   estimated 55-60%. RV grossly normal. No gross valvular   abnormalities. No pericardial effusion.  03/02/16 CXR: IMPRESSION: 1.  Interim slight increase in left pleural effusion. 2. Persistent left upper lobe ill-defined density consistent with infiltrate and/or tumor. Reference is made to prior CT report 02/19/2016 . 3. Persistent left lower lobe atelectasis and/or infiltrate. Mild infiltrate right upper lobe .  02/19/16 CT Chest: IMPRESSION: 1. Interval development of large left pleural effusion. 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 2. Left upper lobe lung mass is identified and worrisome for tumor recurrence. Consider further evaluation with PET-CT is advised. 3. Aortic atherosclerosis and coronary artery calcifications.  Preoperative labs noted. Cr 1.22. K 5.2 (previously 4.8-5.4 during 02/19/16-02/21/16 hospitalization). AST, ALT newly elevated at 50 and 99, respectively. H/H 10.1/31.5. INR 1.20. His hyperkalemia appears somewhat chronic dating back to 01/2015. I wouldn't be as inclined  to repeat but with his AST/ALT newly elevated then I will go ahead and order a STAT CMET on arrival tomorrow. If results are stable then I would anticipate that he can proceed as planned.   George Hugh [facility] Stay [facility]/Anesthesiology Phone [phone] 03/02/2016 3:54 PM

## 2016-03-02 NOTE — Progress Notes (Signed)
Levonne Spiller at Dr. Leonarda Salon office notified of abnormal labs.

## 2016-03-02 NOTE — Progress Notes (Signed)
PCP - Dr. Shon Baton Cardiologist - denies  EKG - 02/19/16 CXR - 03/02/16  Echo- 02/20/16 Stress test/Cardiac Cath - denies  Patient denies chest pain and worsening shortness of breath at PAT appointment.  Patient states that his shortness of breath is much better since he had the fluid drained off.

## 2016-03-03 ENCOUNTER — Ambulatory Visit (HOSPITAL_COMMUNITY)
Admission: RE | Admit: 2016-03-03 | Discharge: 2016-03-03 | Disposition: A | Discharge status: Home / Self Care | Payer: Medicare Other | Source: Ambulatory Visit | Attending: Thoracic Surgery (Cardiothoracic Vascular Surgery) | Admitting: Thoracic Surgery (Cardiothoracic Vascular Surgery)

## 2016-03-03 ENCOUNTER — Encounter (HOSPITAL_COMMUNITY): Payer: Self-pay | Admitting: *Deleted

## 2016-03-03 ENCOUNTER — Ambulatory Visit (HOSPITAL_COMMUNITY): Payer: Medicare Other | Admitting: Certified Registered Nurse Anesthetist

## 2016-03-03 ENCOUNTER — Encounter (HOSPITAL_COMMUNITY)
Admission: RE | Disposition: A | Discharge status: Home / Self Care | Payer: Self-pay | Source: Ambulatory Visit | Attending: Thoracic Surgery (Cardiothoracic Vascular Surgery)

## 2016-03-03 ENCOUNTER — Ambulatory Visit (HOSPITAL_COMMUNITY): Payer: Medicare Other | Admitting: Vascular Surgery

## 2016-03-03 ENCOUNTER — Ambulatory Visit (HOSPITAL_COMMUNITY): Payer: Medicare Other

## 2016-03-03 DIAGNOSIS — J910 Malignant pleural effusion: Secondary | ICD-10-CM

## 2016-03-03 DIAGNOSIS — I10 Essential (primary) hypertension: Secondary | ICD-10-CM

## 2016-03-03 DIAGNOSIS — J90 Pleural effusion, not elsewhere classified: Secondary | ICD-10-CM

## 2016-03-03 DIAGNOSIS — Z87891 Personal history of nicotine dependence: Secondary | ICD-10-CM

## 2016-03-03 DIAGNOSIS — Z9221 Personal history of antineoplastic chemotherapy: Secondary | ICD-10-CM | POA: Insufficient documentation

## 2016-03-03 DIAGNOSIS — D6481 Anemia due to antineoplastic chemotherapy: Secondary | ICD-10-CM | POA: Diagnosis present

## 2016-03-03 DIAGNOSIS — Z7982 Long term (current) use of aspirin: Secondary | ICD-10-CM

## 2016-03-03 DIAGNOSIS — J189 Pneumonia, unspecified organism: Secondary | ICD-10-CM | POA: Diagnosis not present

## 2016-03-03 DIAGNOSIS — Z923 Personal history of irradiation: Secondary | ICD-10-CM | POA: Insufficient documentation

## 2016-03-03 DIAGNOSIS — Z96653 Presence of artificial knee joint, bilateral: Secondary | ICD-10-CM | POA: Diagnosis present

## 2016-03-03 DIAGNOSIS — Z85118 Personal history of other malignant neoplasm of bronchus and lung: Secondary | ICD-10-CM | POA: Insufficient documentation

## 2016-03-03 DIAGNOSIS — R0902 Hypoxemia: Secondary | ICD-10-CM | POA: Diagnosis present

## 2016-03-03 DIAGNOSIS — C3412 Malignant neoplasm of upper lobe, left bronchus or lung: Secondary | ICD-10-CM | POA: Diagnosis present

## 2016-03-03 DIAGNOSIS — Z8249 Family history of ischemic heart disease and other diseases of the circulatory system: Secondary | ICD-10-CM

## 2016-03-03 DIAGNOSIS — Z79899 Other long term (current) drug therapy: Secondary | ICD-10-CM

## 2016-03-03 DIAGNOSIS — Y95 Nosocomial condition: Secondary | ICD-10-CM | POA: Diagnosis present

## 2016-03-03 DIAGNOSIS — Z885 Allergy status to narcotic agent status: Secondary | ICD-10-CM

## 2016-03-03 DIAGNOSIS — Z800 Family history of malignant neoplasm of digestive organs: Secondary | ICD-10-CM

## 2016-03-03 HISTORY — PX: CHEST TUBE INSERTION: SHX231

## 2016-03-03 LAB — COMPREHENSIVE METABOLIC PANEL
ALT: 91 U/L — AB (ref 17–63)
AST: 46 U/L — ABNORMAL HIGH (ref 15–41)
Albumin: 3 g/dL — ABNORMAL LOW (ref 3.5–5.0)
Alkaline Phosphatase: 80 U/L (ref 38–126)
Anion gap: 9 (ref 5–15)
BILIRUBIN TOTAL: 0.5 mg/dL (ref 0.3–1.2)
BUN: 23 mg/dL — ABNORMAL HIGH (ref 6–20)
CHLORIDE: 105 mmol/L (ref 101–111)
CO2: 22 mmol/L (ref 22–32)
CREATININE: 1.29 mg/dL — AB (ref 0.61–1.24)
Calcium: 9.4 mg/dL (ref 8.9–10.3)
GFR, EST AFRICAN AMERICAN: 59 mL/min — AB (ref >60)
GFR, EST NON AFRICAN AMERICAN: 51 mL/min — AB (ref >60)
Glucose, Bld: 110 mg/dL — ABNORMAL HIGH (ref 65–99)
POTASSIUM: 4.9 mmol/L (ref 3.5–5.1)
Sodium: 136 mmol/L (ref 135–145)
TOTAL PROTEIN: 6.5 g/dL (ref 6.5–8.1)

## 2016-03-03 SURGERY — INSERTION, PLEURAL DRAINAGE CATHETER
Anesthesia: Monitor Anesthesia Care | Laterality: Left

## 2016-03-03 MED ORDER — LACTATED RINGERS IV SOLN
INTRAVENOUS | Status: DC
  Administered 2016-03-03: 09:00:00 via INTRAVENOUS

## 2016-03-03 MED ORDER — LIDOCAINE HCL (PF) 1 % IJ SOLN
INTRAMUSCULAR | Status: DC | PRN
  Administered 2016-03-03: 10 mL

## 2016-03-03 MED ORDER — FENTANYL CITRATE (PF) 100 MCG/2ML IJ SOLN
INTRAMUSCULAR | Status: DC | PRN
  Administered 2016-03-03: 50 ug via INTRAVENOUS

## 2016-03-03 MED ORDER — OXYCODONE HCL 5 MG PO TABS
ORAL_TABLET | ORAL | Status: AC
  Filled 2016-03-03: qty 1

## 2016-03-03 MED ORDER — OXYCODONE HCL 5 MG PO TABS
5.0000 mg | ORAL_TABLET | Freq: Four times a day (QID) | ORAL | Status: DC | PRN
  Administered 2016-03-03: 5 mg via ORAL

## 2016-03-03 MED ORDER — PROPOFOL 10 MG/ML IV BOLUS
INTRAVENOUS | Status: DC | PRN
  Administered 2016-03-03: 20 mg via INTRAVENOUS

## 2016-03-03 MED ORDER — FENTANYL CITRATE (PF) 250 MCG/5ML IJ SOLN
INTRAMUSCULAR | Status: AC
  Filled 2016-03-03: qty 5

## 2016-03-03 MED ORDER — 0.9 % SODIUM CHLORIDE (POUR BTL) OPTIME
TOPICAL | Status: DC | PRN
  Administered 2016-03-03: 1000 mL

## 2016-03-03 MED ORDER — ROCURONIUM BROMIDE 50 MG/5ML IV SOLN
INTRAVENOUS | Status: AC
  Filled 2016-03-03: qty 2

## 2016-03-03 MED ORDER — OXYCODONE HCL 5 MG PO TABS: 5.0000 mg | ORAL_TABLET | Freq: Four times a day (QID) | ORAL | 0 refills | Status: DC | PRN

## 2016-03-03 MED ORDER — PHENYLEPHRINE HCL 10 MG/ML IJ SOLN
INTRAMUSCULAR | Status: DC | PRN
  Administered 2016-03-03 (×2): 80 ug via INTRAVENOUS
  Administered 2016-03-03: 120 ug via INTRAVENOUS
  Administered 2016-03-03: 80 ug via INTRAVENOUS
  Administered 2016-03-03 (×2): 120 ug via INTRAVENOUS
  Administered 2016-03-03 (×2): 80 ug via INTRAVENOUS

## 2016-03-03 MED ORDER — PROPOFOL 500 MG/50ML IV EMUL
INTRAVENOUS | Status: DC | PRN
  Administered 2016-03-03: 50 ug/kg/min via INTRAVENOUS

## 2016-03-03 MED ORDER — LIDOCAINE 2% (20 MG/ML) 5 ML SYRINGE
INTRAMUSCULAR | Status: AC
  Filled 2016-03-03: qty 15

## 2016-03-03 MED ORDER — MIDAZOLAM HCL 5 MG/5ML IJ SOLN
INTRAMUSCULAR | Status: DC | PRN
  Administered 2016-03-03: 1 mg via INTRAVENOUS

## 2016-03-03 MED ORDER — MIDAZOLAM HCL 2 MG/2ML IJ SOLN
INTRAMUSCULAR | Status: AC
  Filled 2016-03-03: qty 2

## 2016-03-03 SURGICAL SUPPLY — 30 items
ADH SKN CLS APL DERMABOND .7 (GAUZE/BANDAGES/DRESSINGS) ×1
BLADE SURG 10 STRL SS (BLADE) ×1 IMPLANT
CANISTER SUCTION 2500CC (MISCELLANEOUS) ×2 IMPLANT
COVER SURGICAL LIGHT HANDLE (MISCELLANEOUS) ×2 IMPLANT
DERMABOND ADVANCED (GAUZE/BANDAGES/DRESSINGS) ×1
DERMABOND ADVANCED .7 DNX12 (GAUZE/BANDAGES/DRESSINGS) ×1 IMPLANT
DRAPE C-ARM 42X72 X-RAY (DRAPES) ×2 IMPLANT
DRAPE LAPAROSCOPIC ABDOMINAL (DRAPES) ×2 IMPLANT
GLOVE SURG SIGNA 7.5 PF LTX (GLOVE) ×2 IMPLANT
GOWN STRL REUS W/ TWL LRG LVL3 (GOWN DISPOSABLE) ×1 IMPLANT
GOWN STRL REUS W/ TWL XL LVL3 (GOWN DISPOSABLE) ×1 IMPLANT
GOWN STRL REUS W/TWL LRG LVL3 (GOWN DISPOSABLE) ×2
GOWN STRL REUS W/TWL XL LVL3 (GOWN DISPOSABLE) ×2
KIT BASIN OR (CUSTOM PROCEDURE TRAY) ×2 IMPLANT
KIT PLEURX DRAIN CATH 1000ML (MISCELLANEOUS) ×2 IMPLANT
KIT PLEURX DRAIN CATH 15.5FR (DRAIN) ×2 IMPLANT
KIT ROOM TURNOVER OR (KITS) ×2 IMPLANT
NDL 18GX1X1/2 (RX/OR ONLY) (NEEDLE) IMPLANT
NEEDLE 18GX1X1/2 (RX/OR ONLY) (NEEDLE) ×2 IMPLANT
NS IRRIG 1000ML POUR BTL (IV SOLUTION) ×2 IMPLANT
PACK GENERAL/GYN (CUSTOM PROCEDURE TRAY) ×2 IMPLANT
PAD ARMBOARD 7.5X6 YLW CONV (MISCELLANEOUS) ×4 IMPLANT
SET DRAINAGE LINE (MISCELLANEOUS) IMPLANT
SUT ETHILON 3 0 FSL (SUTURE) ×2 IMPLANT
SUT VIC AB 3-0 X1 27 (SUTURE) ×2 IMPLANT
SYRINGE 10CC LL (SYRINGE) ×1 IMPLANT
TOWEL OR 17X24 6PK STRL BLUE (TOWEL DISPOSABLE) ×2 IMPLANT
TOWEL OR 17X26 10 PK STRL BLUE (TOWEL DISPOSABLE) ×2 IMPLANT
VALVE REPLACEMENT CAP (MISCELLANEOUS) IMPLANT
WATER STERILE IRR 1000ML POUR (IV SOLUTION) ×2 IMPLANT

## 2016-03-03 NOTE — Progress Notes (Signed)
ED CM consulted to assist with [facility] for d/c home after procedure with Dr Roxan Hockey today  CM reviewed in details medicare guidelines, Choices of home health [facility]) (length of stay in home, types of [facility] staff available, coverage, primary caregiver, up to 24 hrs before services may be started) Wife denied need of DME and PDN (private duty nursing) services . CM provided pt/wife with [facility] contact number for f/u on services Wife is primary care giver Tiffany of [facility] contacted to provider orders and referral for services Pt leaving PACU with his box of drains to take home Wife appreciative of services rendered and prefers to be contact on her cell or the home number Tiffany given wife cell #

## 2016-03-03 NOTE — Discharge Instructions (Signed)
Do not drive or engage in heavy physical activity for 24 hours  You may resume normal activities tomorrow  You may shower tomorrow, leave dressing in place over the catheter when you shower  You have a prescription for oxycodone for pain. You may use as directed. You may use Tylenol or Advil in addition to, or instead of, the oxycodone  Oxycodone is a narcotic, do not drive while taking it.  A home health nurse will be arranged for catheter drainage.  My office will contact you with follow up information  Call [phone] if you develop chest pain, shortness of breath, fever > 101F, or notice drainage from or redness around the catheter or incision.

## 2016-03-03 NOTE — H&P (View-Only) (Signed)
PCP is [name], MD Referring Provider is [name], MD  Chief Complaint  Patient presents with  . Pleural Effusion    Surgical eval for PleurX Cath placement, CXR 02/22/16, HX of thoracentesis    HPI: 82 year old man with a history of small cell lung cancer who is sent for consultation regarding a left pleural effusion.  Mr. Kuhrt is an 82 year old man who had a hip fracture back in early 2016. Findings were consistent with a pathologic fracture which led to a workup which ultimately revealed small cell carcinoma. I do not see any pathology from the fracture, nor do I know if that was related. It was ultimately determined that he had limited stage small cell cancer (T3, N2) and he was treated with concurrent chemoradiation. He later had whole brain radiation. He completed his concurrent chemoradiation in June 2016.  He saw Dr. [name] in May and was doing reasonably well at that time.  A few weeks ago he started feeling short of breath. Initially this was with exertion but progressed to the point where he was short of breath at rest. He had a persistent dry cough and subjective wheezing. Finally he got to the point refill acute just could not breathe on 02/19/2016 and he went to the emergency department. He was found to have a white out of his left lung. He was admitted and had thoracentesis on 7/14 which drained 1.5 L of fluid. We then had another thoracentesis on 02/22/2016. 2 L of fluid was drained on that occasion. His chest x-ray improved significantly as to the symptoms.  He has not had recurrence of his symptoms since he was discharged from the hospital. (Only 3 days ago). He has noted poor appetite but has not had any significant weight loss. Cytology on the fluid from 02/19/2016 was negative. I do not see any results from the second thoracentesis.  Zubrod Score: At the time of surgery this patient's most appropriate activity status/level should be described as: '[]'$     0     Normal activity, no symptoms '[x]'$     1    Restricted in physical strenuous activity but ambulatory, able to do out light work '[]'$     2    Ambulatory and capable of self care, unable to do work activities, up and about >50 % of waking hours                              '[]'$     3    Only limited self care, in bed greater than 50% of waking hours '[]'$     4    Completely disabled, no self care, confined to bed or chair '[]'$     5    Moribund   Past Medical History  Diagnosis Date  . Essential hypertension   . Lung cancer ([place])   . S/P chemotherapy, time since 4-12 weeks   . S/P radiation therapy     Past Surgical History  Procedure Laterality Date  . Total knee arthroplasty      bilateral  . Lung biopsy      Family History  Problem Relation Age of Onset  . Heart failure Mother   . Colon cancer Sister     Social History Social History  Substance Use Topics  . Smoking status: Former Smoker -- 1.00 packs/day for 25 years    Types: Cigarettes    Quit date: 08/09/1975  . Smokeless tobacco: Never  Used  . Alcohol Use: No    Current Outpatient Prescriptions  Medication Sig Dispense Refill  . acetaminophen (TYLENOL) 500 MG tablet Take 1 tablet (500 mg total) by mouth every 6 (six) hours as needed for mild pain. (Patient taking differently: Take 1,000 mg by mouth every 6 (six) hours as needed for mild pain. ) 30 tablet 0  . alendronate (FOSAMAX) 70 MG tablet Take 70 mg by mouth every 14 (fourteen) days. Last dose 02/13/16    . aspirin EC 81 MG tablet Take 81 mg by mouth at bedtime.    [name] lisinopril (PRINIVIL,ZESTRIL) 20 MG tablet Take 20 mg by mouth at bedtime.      No current facility-administered medications for this visit.    Allergies  Allergen Reactions  . Morphine And Related Itching and Rash    Review of Systems  Constitutional: Positive for activity change and appetite change. Negative for unexpected weight change.  HENT: Negative for trouble swallowing and voice change.    Respiratory: Positive for cough, shortness of breath and wheezing.   Cardiovascular: Negative for chest pain.  Gastrointestinal: Negative for abdominal pain and blood in stool.  Genitourinary: Negative for hematuria and difficulty urinating.  Musculoskeletal: Positive for joint swelling and arthralgias.  Hematological: Negative for adenopathy. Bruises/bleeds easily.  All other systems reviewed and are negative.   BP 120/67 mmHg  Pulse 92  Resp 20  Ht '5\' 10"'$  (1.778 m)  Wt 188 lb (85.276 kg)  BMI 26.98 kg/m2  SpO2 97% Physical Exam  Constitutional: He is oriented to person, place, and time. No distress.  Elderly  HENT:  Head: Normocephalic and atraumatic.  Eyes: EOM are normal. Pupils are equal, round, and reactive to light.  Pulmonary/Chest: Effort normal. No respiratory distress. He has no wheezes.  Diminished BS left base  Abdominal: Soft. He exhibits no distension. There is no tenderness.  Musculoskeletal: He exhibits no edema.  Neurological: He is alert and oriented to person, place, and time. No cranial nerve deficit.  Walks with cane  Skin: Skin is dry.  Vitals reviewed.    Diagnostic Tests: CLINICAL DATA: Status post left pleural effusion  EXAM: CHEST 1 VIEW  COMPARISON: 02/20/2016  FINDINGS: Moderate left pleural effusion, decreased. No pneumothorax is seen.  Scarring/mass-like opacity in the medial left upper lobe, better evaluated on CT.  Patchy left lower lobe opacity, possibly atelectasis.  Right lung is essentially clear.  The heart is normal in size.  Degenerative changes of the thoracic spine.  IMPRESSION: Moderate left pleural effusion, decreased. No pneumothorax is seen status post thoracentesis.  Scarring/mass-like opacity in the medial left upper lobe, better evaluated on CT.   Electronically Signed  By: Julian Hy M.D.  On: 02/22/2016 11:47  I personally reviewed all the chest x-rays from his recent  admission as well as his most recent CT chest.  Impression:  82 year old man with history of small cell carcinoma who had concurrent chemoradiation completed about a year ago. He also had whole brain irradiation which was completed in December 2016. He presented about 6 days ago with shortness of breath and was found to have a large left pleural effusion. He had 3.5 L of fluid drained off over the course of a couple of days and has had marked symptomatic relief. This is almost certainly a malignant effusion despite the negative cytology on the initial fluid.  I discussed the possibility of placing a pleural catheter for management of the effusion with Mr. and Mrs. Grandville Silos. We discussed  the natural history of malignant pleural effusions. They understand that the catheter placement would be palliative in nature. I described the procedure to them as well as the postoperative management of the drain. I informed him of the indications, risks, benefits, and alternatives. They understand the risks include, but are not limited to bleeding, catheter malposition, catheter occlusion, infection, as well as the possibility of other unforeseeable complications.   He does wish to proceed with pleural catheter placement. He does have some residual fluid present on his chest x-ray from 3 days ago, but catheter placement will be easier if there is a larger effusion at the time of the procedure. I don't want to wait until his lung is completely whited out and he is unable to breathe, so recommended that we schedule him for catheter placement next Thursday, 03/03/2016. We'll plan to do a chest x-ray on 03/02/2016 to make sure there is enough fluid to warrant the procedure.  Plan: Pleural catheter placement on Thursday, 03/03/2016  Melrose Nakayama, MD [facility] [phone]

## 2016-03-03 NOTE — Interval H&P Note (Signed)
History and Physical Interval Note:  03/03/2016 9:58 AM  Travis Medina  has presented today for surgery, with the diagnosis of LEFT PLEURAL EFFUSION  The various methods of treatment have been discussed with the patient and family. After consideration of risks, benefits and other options for treatment, the patient has consented to  Procedure(s): INSERTION PLEURAL DRAINAGE CATHETER (Left) as a surgical intervention .  The patient's history has been reviewed, patient examined, no change in status, stable for surgery.  I have reviewed the patient's chart and labs.  Questions were answered to the patient's satisfaction.     Melrose Nakayama

## 2016-03-03 NOTE — Brief Op Note (Signed)
03/03/2016  11:20 AM  PATIENT:  Travis Medina  82 y.o. male  PRE-OPERATIVE DIAGNOSIS:  LEFT PLEURAL EFFUSION  POST-OPERATIVE DIAGNOSIS:  LEFT PLEURAL EFFUSION  PROCEDURE:  Procedure(s): INSERTION PLEURAL DRAINAGE CATHETER (Left)  SURGEON:  Surgeon(s) and Role:    * Melrose Nakayama, MD - Primary  ANESTHESIA:   local and IV sedation  EBL:  Total I/O In: 600 [I.V.:600] Out: 10 [Blood:10]  BLOOD ADMINISTERED:none  DRAINS: pleural catheter left pleural space   LOCAL MEDICATIONS USED:  LIDOCAINE  and Amount: 10 ml  SPECIMEN:  Source of Specimen:  left pleural fluid  DISPOSITION OF SPECIMEN:  N/A  COUNTS:  YES  PLAN OF CARE: Discharge to home after PACU  PATIENT DISPOSITION:  PACU - hemodynamically stable.   Delay start of Pharmacological VTE agent (>24hrs) due to surgical blood loss or risk of bleeding: not applicable

## 2016-03-03 NOTE — Anesthesia Preprocedure Evaluation (Signed)
Anesthesia Evaluation  Patient identified by MRN, date of birth, ID band Patient awake    Reviewed: Allergy & Precautions, NPO status , Patient's Chart, lab work & pertinent test results  Airway Mallampati: II  TM Distance: >3 FB     Dental   Pulmonary shortness of breath, former smoker,    breath sounds clear to auscultation       Cardiovascular hypertension,  Rhythm:Regular Rate:Normal     Neuro/Psych    GI/Hepatic negative GI ROS,   Endo/Other    Renal/GU Renal diseasenegative Renal ROS     Musculoskeletal  (+) Arthritis ,   Abdominal   Peds  Hematology  (+) anemia ,   Anesthesia Other Findings   Reproductive/Obstetrics                             Anesthesia Physical Anesthesia Plan  ASA: III  Anesthesia Plan: MAC   Post-op Pain Management:    Induction: Intravenous  Airway Management Planned: Simple Face Mask  Additional Equipment:   Intra-op Plan:   Post-operative Plan:   Informed Consent:   Dental advisory given  Plan Discussed with: CRNA and Anesthesiologist  Anesthesia Plan Comments:         Anesthesia Quick Evaluation

## 2016-03-03 NOTE — Transfer of Care (Signed)
Immediate Anesthesia Transfer of Care Note  Patient: Travis Medina  Procedure(s) Performed: Procedure(s): INSERTION PLEURAL DRAINAGE CATHETER (Left)  Patient Location: PACU  Anesthesia Type:MAC  Level of Consciousness: awake, alert , oriented and patient cooperative  Airway & Oxygen Therapy: Patient Spontanous Breathing and Patient connected to nasal cannula oxygen  Post-op Assessment: Report given to RN and Post -op Vital signs reviewed and stable  Post vital signs: Reviewed and stable  Last Vitals:  Vitals:   03/03/16 0821 03/03/16 1100  BP: 135/69   Pulse: 78   Resp: 20   Temp: 36.7 C (P) 36.6 C    Last Pain:  Vitals:   03/03/16 0821  TempSrc: Oral      Patients Stated Pain Goal: 2 (56/38/75 6433)  Complications: No apparent anesthesia complications

## 2016-03-04 ENCOUNTER — Encounter (HOSPITAL_COMMUNITY): Payer: Self-pay | Admitting: Thoracic Surgery (Cardiothoracic Vascular Surgery)

## 2016-03-04 NOTE — Op Note (Signed)
Travis Medina, Travis Medina NO.:  [id number]  MEDICAL RECORD NO.:  [id number]  LOCATION:  [facility]                         FACILITY:  [place] [facility]  PHYSICIAN:  Revonda Standard. Roxan Hockey, M.D.DATE OF BIRTH:  13-Dec-1933  DATE OF PROCEDURE:  03/03/2016 DATE OF DISCHARGE:  03/03/2016                              OPERATIVE REPORT   PREOPERATIVE DIAGNOSIS:  Malignant left pleural effusion.  POSTOPERATIVE DIAGNOSIS:  Malignant left pleural effusion.  PROCEDURE:  Left pleural catheter placement.  SURGEON:  Modesto Charon, MD  ASSISTANT:  None.  ANESTHESIA:  Local with intravenous sedation.  FINDINGS:  Catheter in good position.  1 L of amber fluid drained.  CLINICAL NOTE:  [name] is an 82 year old gentleman with a history of small cell lung cancer.  He has a rapidly recurring left pleural effusion.  He was referred for pleural catheter placement.  The indications, risks, benefits, and alternatives were discussed in detail with the patient.  He did understand this would involve an indwelling catheter that would need ongoing care.  He understood and accepted the risks and wishes to proceed.  OPERATIVE NOTE:  [name] was brought to the preoperative holding area on March 03, 2016.  He was placed in a supine position.  Anesthesia administered intravenous sedation and monitored the patient.  The left chest was prepped and draped in the usual sterile fashion.  Fluoroscopy was used to localize the effusion.  The catheter entry and exit sites were anesthetized with 1% lidocaine, as was the tract between the sites and the chest wall at the site for catheter insertion.  The pleural effusion was accessed with a small bore needle.  After ensuring adequate local anesthetic effect, an incision was made at the entry site laterally.  The pleural effusion was accessed using the Seldinger technique.  Fluoroscopy confirmed position of the wire within the pleural space.  An incision  then was made at the exit site and the catheter was tunneled from the exit site to the entry site with the cuff just under the skin at the exit site.  The tract over the wire was dilated and the peel-away sheath introducer was placed.  The catheter was advanced through the peel-away sheath introducer, which was then removed.  Fluoroscopy confirmed position of the catheter within the pleural space.  The catheter was hooked to suction and 1 L of amber fluid was evacuated.  The lateral incision was closed with a running 3-0 Vicryl suture.  The catheter was secured at the exit site with a 3-0 nylon suture.  The catheter was capped and dressing was applied.  The patient was taken from the operating room to the postanesthetic care unit in good condition.     Revonda Standard Roxan Hockey, M.D.     SCH/MEDQ  D:  03/03/2016  T:  03/03/2016  Job:  [id number]

## 2016-03-06 ENCOUNTER — Inpatient Hospital Stay (HOSPITAL_COMMUNITY)
Admission: EM | Admit: 2016-03-06 | Discharge: 2016-03-10 | DRG: 194 | Disposition: A | Discharge status: Home Health | Payer: Medicare Other | Attending: Cardiothoracic Surgery | Admitting: Cardiothoracic Surgery

## 2016-03-06 ENCOUNTER — Encounter (HOSPITAL_COMMUNITY): Payer: Self-pay | Admitting: Emergency Medicine

## 2016-03-06 ENCOUNTER — Emergency Department (HOSPITAL_COMMUNITY): Payer: Medicare Other

## 2016-03-06 DIAGNOSIS — R0602 Shortness of breath: Secondary | ICD-10-CM | POA: Diagnosis present

## 2016-03-06 DIAGNOSIS — Z923 Personal history of irradiation: Secondary | ICD-10-CM | POA: Diagnosis not present

## 2016-03-06 DIAGNOSIS — I10 Essential (primary) hypertension: Secondary | ICD-10-CM | POA: Diagnosis present

## 2016-03-06 DIAGNOSIS — Y95 Nosocomial condition: Secondary | ICD-10-CM | POA: Diagnosis present

## 2016-03-06 DIAGNOSIS — J189 Pneumonia, unspecified organism: Secondary | ICD-10-CM | POA: Diagnosis present

## 2016-03-06 DIAGNOSIS — Z8249 Family history of ischemic heart disease and other diseases of the circulatory system: Secondary | ICD-10-CM | POA: Diagnosis not present

## 2016-03-06 DIAGNOSIS — Z79899 Other long term (current) drug therapy: Secondary | ICD-10-CM | POA: Diagnosis not present

## 2016-03-06 DIAGNOSIS — Z87891 Personal history of nicotine dependence: Secondary | ICD-10-CM | POA: Diagnosis not present

## 2016-03-06 DIAGNOSIS — R0902 Hypoxemia: Secondary | ICD-10-CM | POA: Diagnosis present

## 2016-03-06 DIAGNOSIS — C3412 Malignant neoplasm of upper lobe, left bronchus or lung: Secondary | ICD-10-CM

## 2016-03-06 DIAGNOSIS — Z800 Family history of malignant neoplasm of digestive organs: Secondary | ICD-10-CM | POA: Diagnosis not present

## 2016-03-06 DIAGNOSIS — J910 Malignant pleural effusion: Secondary | ICD-10-CM | POA: Diagnosis present

## 2016-03-06 DIAGNOSIS — Z7982 Long term (current) use of aspirin: Secondary | ICD-10-CM | POA: Diagnosis not present

## 2016-03-06 DIAGNOSIS — Z9221 Personal history of antineoplastic chemotherapy: Secondary | ICD-10-CM | POA: Diagnosis not present

## 2016-03-06 DIAGNOSIS — D6481 Anemia due to antineoplastic chemotherapy: Secondary | ICD-10-CM | POA: Diagnosis present

## 2016-03-06 DIAGNOSIS — Z96653 Presence of artificial knee joint, bilateral: Secondary | ICD-10-CM | POA: Diagnosis present

## 2016-03-06 DIAGNOSIS — Z885 Allergy status to narcotic agent status: Secondary | ICD-10-CM | POA: Diagnosis not present

## 2016-03-06 LAB — URINALYSIS, ROUTINE W REFLEX MICROSCOPIC
BILIRUBIN URINE: NEGATIVE
Glucose, UA: NEGATIVE mg/dL
Hgb urine dipstick: NEGATIVE
Ketones, ur: NEGATIVE mg/dL
Leukocytes, UA: NEGATIVE
NITRITE: NEGATIVE
PROTEIN: NEGATIVE mg/dL
SPECIFIC GRAVITY, URINE: 1.017 (ref 1.005–1.030)
pH: 5.5 (ref 5.0–8.0)

## 2016-03-06 LAB — CBC
HCT: 25.8 % — ABNORMAL LOW (ref 39.0–52.0)
Hemoglobin: 8.2 g/dL — ABNORMAL LOW (ref 13.0–17.0)
MCH: 30.1 pg (ref 26.0–34.0)
MCHC: 31.8 g/dL (ref 30.0–36.0)
MCV: 94.9 fL (ref 78.0–100.0)
Platelets: 237 10*3/uL (ref 150–400)
RBC: 2.72 MIL/uL — ABNORMAL LOW (ref 4.22–5.81)
RDW: 12.7 % (ref 11.5–15.5)
WBC: 22.1 10*3/uL — ABNORMAL HIGH (ref 4.0–10.5)

## 2016-03-06 LAB — CBC WITH DIFFERENTIAL/PLATELET
BASOS PCT: 0 %
Basophils Absolute: 0 10*3/uL (ref 0.0–0.1)
EOS PCT: 0 %
Eosinophils Absolute: 0 10*3/uL (ref 0.0–0.7)
HCT: 27.4 % — ABNORMAL LOW (ref 39.0–52.0)
HEMOGLOBIN: 8.8 g/dL — AB (ref 13.0–17.0)
LYMPHS ABS: 0.4 10*3/uL — AB (ref 0.7–4.0)
Lymphocytes Relative: 2 %
MCH: 30.6 pg (ref 26.0–34.0)
MCHC: 32.1 g/dL (ref 30.0–36.0)
MCV: 95.1 fL (ref 78.0–100.0)
MONO ABS: 0.6 10*3/uL (ref 0.1–1.0)
MONOS PCT: 3 %
NEUTROS ABS: 18.1 10*3/uL — AB (ref 1.7–7.7)
Neutrophils Relative %: 95 %
PLATELETS: 254 10*3/uL (ref 150–400)
RBC: 2.88 MIL/uL — ABNORMAL LOW (ref 4.22–5.81)
RDW: 12.4 % (ref 11.5–15.5)
WBC: 19.1 10*3/uL — ABNORMAL HIGH (ref 4.0–10.5)

## 2016-03-06 LAB — COMPREHENSIVE METABOLIC PANEL
ALBUMIN: 2.6 g/dL — AB (ref 3.5–5.0)
ALT: 40 U/L (ref 17–63)
AST: 23 U/L (ref 15–41)
Alkaline Phosphatase: 82 U/L (ref 38–126)
Anion gap: 8 (ref 5–15)
BUN: 29 mg/dL — AB (ref 6–20)
CHLORIDE: 103 mmol/L (ref 101–111)
CO2: 22 mmol/L (ref 22–32)
CREATININE: 1.39 mg/dL — AB (ref 0.61–1.24)
Calcium: 8.7 mg/dL — ABNORMAL LOW (ref 8.9–10.3)
GFR calc Af Amer: 54 mL/min — ABNORMAL LOW (ref >60)
GFR calc non Af Amer: 46 mL/min — ABNORMAL LOW (ref >60)
GLUCOSE: 162 mg/dL — AB (ref 65–99)
POTASSIUM: 4.4 mmol/L (ref 3.5–5.1)
Sodium: 133 mmol/L — ABNORMAL LOW (ref 135–145)
Total Bilirubin: 0.3 mg/dL (ref 0.3–1.2)
Total Protein: 5.7 g/dL — ABNORMAL LOW (ref 6.5–8.1)

## 2016-03-06 LAB — I-STAT CG4 LACTIC ACID, ED
LACTIC ACID, VENOUS: 1.94 mmol/L — AB (ref 0.5–1.9)
LACTIC ACID, VENOUS: 1.83 mmol/L (ref 0.5–1.9)

## 2016-03-06 LAB — CREATININE, SERUM
Creatinine, Ser: 1.37 mg/dL — ABNORMAL HIGH (ref 0.61–1.24)
GFR calc Af Amer: 55 mL/min — ABNORMAL LOW (ref >60)
GFR calc non Af Amer: 47 mL/min — ABNORMAL LOW (ref >60)

## 2016-03-06 MED ORDER — DOCUSATE SODIUM 100 MG PO CAPS
100.0000 mg | ORAL_CAPSULE | Freq: Two times a day (BID) | ORAL | Status: DC
  Administered 2016-03-06 – 2016-03-09 (×6): 100 mg via ORAL
  Filled 2016-03-06 (×7): qty 1

## 2016-03-06 MED ORDER — SODIUM CHLORIDE 0.9 % IV BOLUS (SEPSIS)
500.0000 mL | Freq: Once | INTRAVENOUS | Status: AC
  Administered 2016-03-06: 500 mL via INTRAVENOUS

## 2016-03-06 MED ORDER — ENOXAPARIN SODIUM 30 MG/0.3ML ~~LOC~~ SOLN
30.0000 mg | SUBCUTANEOUS | Status: DC
  Administered 2016-03-06 – 2016-03-09 (×4): 30 mg via SUBCUTANEOUS
  Filled 2016-03-06 (×4): qty 0.3

## 2016-03-06 MED ORDER — SODIUM CHLORIDE 0.9 % IV BOLUS (SEPSIS)
1000.0000 mL | Freq: Once | INTRAVENOUS | Status: AC
  Administered 2016-03-06: 1000 mL via INTRAVENOUS

## 2016-03-06 MED ORDER — ONDANSETRON HCL 4 MG/2ML IJ SOLN: 4.0000 mg | Freq: Four times a day (QID) | INTRAMUSCULAR | Status: DC | PRN

## 2016-03-06 MED ORDER — DEXTROSE 5 % IV SOLN: 2.0000 g | INTRAVENOUS | Status: DC

## 2016-03-06 MED ORDER — ACETAMINOPHEN 650 MG RE SUPP
650.0000 mg | Freq: Four times a day (QID) | RECTAL | Status: DC | PRN
Start: 2016-03-06 — End: 2016-03-10

## 2016-03-06 MED ORDER — VANCOMYCIN HCL IN DEXTROSE 1-5 GM/200ML-% IV SOLN
1000.0000 mg | Freq: Once | INTRAVENOUS | Status: AC
  Administered 2016-03-06: 1000 mg via INTRAVENOUS
  Filled 2016-03-06: qty 200

## 2016-03-06 MED ORDER — MAGNESIUM CITRATE PO SOLN
1.0000 | Freq: Once | ORAL | Status: DC | PRN
  Filled 2016-03-06: qty 296

## 2016-03-06 MED ORDER — PIPERACILLIN-TAZOBACTAM 3.375 G IVPB
3.3750 g | Freq: Three times a day (TID) | INTRAVENOUS | Status: DC
  Administered 2016-03-06 – 2016-03-09 (×9): 3.375 g via INTRAVENOUS
  Filled 2016-03-06 (×11): qty 50

## 2016-03-06 MED ORDER — TRAMADOL HCL 50 MG PO TABS
50.0000 mg | ORAL_TABLET | Freq: Four times a day (QID) | ORAL | Status: DC | PRN
  Administered 2016-03-07: 50 mg via ORAL
  Filled 2016-03-06: qty 1

## 2016-03-06 MED ORDER — VANCOMYCIN HCL 10 G IV SOLR
1250.0000 mg | INTRAVENOUS | Status: DC
  Administered 2016-03-07 – 2016-03-09 (×3): 1250 mg via INTRAVENOUS
  Filled 2016-03-06 (×3): qty 1250

## 2016-03-06 MED ORDER — ENSURE ENLIVE PO LIQD
237.0000 mL | Freq: Three times a day (TID) | ORAL | Status: DC
  Administered 2016-03-06 – 2016-03-10 (×11): 237 mL via ORAL

## 2016-03-06 MED ORDER — GUAIFENESIN ER 600 MG PO TB12
600.0000 mg | ORAL_TABLET | Freq: Two times a day (BID) | ORAL | Status: DC
  Administered 2016-03-06 – 2016-03-10 (×9): 600 mg via ORAL
  Filled 2016-03-06 (×9): qty 1

## 2016-03-06 MED ORDER — ADULT MULTIVITAMIN W/MINERALS CH
1.0000 | ORAL_TABLET | Freq: Every day | ORAL | Status: DC
  Administered 2016-03-07 – 2016-03-10 (×4): 1 via ORAL
  Filled 2016-03-06 (×4): qty 1

## 2016-03-06 MED ORDER — DEXTROSE-NACL 5-0.45 % IV SOLN
INTRAVENOUS | Status: DC
  Administered 2016-03-06: 50 mL/h via INTRAVENOUS
  Administered 2016-03-08: 1000 mL via INTRAVENOUS
  Administered 2016-03-09 – 2016-03-10 (×2): via INTRAVENOUS

## 2016-03-06 MED ORDER — VANCOMYCIN HCL IN DEXTROSE 1-5 GM/200ML-% IV SOLN: 1000.0000 mg | Freq: Two times a day (BID) | INTRAVENOUS | Status: DC

## 2016-03-06 MED ORDER — ACETAMINOPHEN 325 MG PO TABS
650.0000 mg | ORAL_TABLET | Freq: Four times a day (QID) | ORAL | Status: DC | PRN
Start: 2016-03-06 — End: 2016-03-10
  Administered 2016-03-06 – 2016-03-09 (×5): 650 mg via ORAL
  Filled 2016-03-06 (×5): qty 2

## 2016-03-06 MED ORDER — DEXTROSE 5 % IV SOLN
2.0000 g | Freq: Once | INTRAVENOUS | Status: AC
  Administered 2016-03-06: 2 g via INTRAVENOUS
  Filled 2016-03-06: qty 2

## 2016-03-06 MED ORDER — ONDANSETRON HCL 4 MG PO TABS: 4.0000 mg | ORAL_TABLET | Freq: Four times a day (QID) | ORAL | Status: DC | PRN

## 2016-03-06 MED ORDER — PANTOPRAZOLE SODIUM 40 MG PO TBEC
40.0000 mg | DELAYED_RELEASE_TABLET | Freq: Every day | ORAL | Status: DC
  Administered 2016-03-06 – 2016-03-10 (×5): 40 mg via ORAL
  Filled 2016-03-06 (×5): qty 1

## 2016-03-06 MED ORDER — SODIUM CHLORIDE 0.9% FLUSH
3.0000 mL | Freq: Two times a day (BID) | INTRAVENOUS | Status: DC
  Administered 2016-03-06 – 2016-03-08 (×4): 3 mL via INTRAVENOUS

## 2016-03-06 MED ORDER — SORBITOL 70 % SOLN: 30.0000 mL | Freq: Every day | Status: DC | PRN

## 2016-03-06 MED ORDER — ASPIRIN EC 81 MG PO TBEC
81.0000 mg | DELAYED_RELEASE_TABLET | Freq: Every day | ORAL | Status: DC
  Administered 2016-03-06 – 2016-03-09 (×4): 81 mg via ORAL
  Filled 2016-03-06 (×4): qty 1

## 2016-03-06 MED ORDER — LISINOPRIL 10 MG PO TABS
10.0000 mg | ORAL_TABLET | Freq: Every day | ORAL | Status: DC
  Administered 2016-03-07 – 2016-03-10 (×4): 10 mg via ORAL
  Filled 2016-03-06 (×4): qty 1

## 2016-03-06 NOTE — ED Triage Notes (Signed)
Woke up weak this am and had a fever, wife gave 2 tylenol, haas chest port that they have been drawing fluid from 550 cc on Friday and 300cc yesterday. Pt pale resp per ems were 42 and bvp was low 87/52 got 300 bolus per ems and placed on 2 l [place] for low sats of 83 % ra per ems

## 2016-03-06 NOTE — H&P (Signed)
Travis Medina       [place] [address]             [phone]        Travis Medina Medical Record #[id number] Date of Birth: May 29, 1934  Referring: Dr. [name] Primary Care: [name], MD  Chief Complaint:    Chief Complaint  Patient presents with  . Shortness of Breath  . 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 He was treated with chemoradiation as well as prophylactic brain radiation. Recently he developed a recurrent left pleural effusion. Cytology was negative. However CT scan of the chest performed earlier this month shows a new left upper lobe 2. or centimeter mass suspicious for recurrence as well as evidence of pleural nodules. The patient has required thoracentesis twice and a left Pleurx catheter was placed as an outpatient 3 days ago by Dr. Roxan Hockey. 1.1 L of xanthochromic fluid was removed. No complications. First 2 days the patient did well. The Pleurx cath was drained last 2 days and wase recorded as 500 cc and 200 cc. The patient woke up this morning weak with a fever 101 and difficulty standing. He presented to the emergency department and was found have a 101.6 temperature with a white count of 20,000. Chest x-ray shows probable left lower lobe pneumonia. The patient had soft blood pressure was given a 500 cc saline bolus. The patient's lactate was 1.9. Renal function is normal. Patient has chronic anemia with hematocrit 27%.  Patient is being admitted for IV antibiotic therapy of his HCAP  Pneumonia. Blood cultures urine cultures have been sent. Sputum culture pending.   Current Activity/ Functional Status: Patient lives at home with his wife and has done well until this  past month when he has developed shortness of breath and pleural effusion. His weight has been stable.   Zubrod Score: At the time of surgery this patient's most appropriate activity status/level should be described as: '[]'$     0    Normal activity, no symptoms '[x]'$     1    Restricted in physical strenuous activity but ambulatory, able to do out light work '[]'$     2    Ambulatory and capable of self care, unable to do work activities, up and about                 more than 50%  Of the time                            '[]'$     3    Only limited self care, in bed greater than 50% of waking hours '[]'$     4    Completely disabled, no self care, confined to bed or chair '[]'$     5    Moribund  Past Medical History:  Diagnosis Date  . Arthritis   . Essential hypertension   . Hypertension   . Lung cancer ([place])   . S/P chemotherapy, time since 4-12 weeks   . S/P radiation therapy   . Shortness of breath dyspnea   . Wears glasses     Past Surgical History:  Procedure Laterality Date  . CHEST TUBE  INSERTION Left 03/03/2016   Procedure: INSERTION PLEURAL DRAINAGE CATHETER;  Surgeon: Melrose Nakayama, MD;  Location: [facility];  Service: Thoracic;  Laterality: Left;  . COLONOSCOPY    . LUNG BIOPSY    . TOTAL KNEE ARTHROPLASTY     bilateral    History  Smoking Status  . Former Smoker  . Packs/day: 1.00  . Years: 25.00  . Types: Cigarettes  . Quit date: 08/09/1975  Smokeless Tobacco  . Never Used   History  Alcohol Use No    Social History   Social History  . Marital status: Married    Spouse name: N/A  . Number of children: N/A  . Years of education: N/A   Occupational History  . Not on file.   Social History Main Topics  . Smoking status: Former Smoker    Packs/day: 1.00    Years: 25.00    Types: Cigarettes    Quit date: 08/09/1975  . Smokeless tobacco: Never Used  . Alcohol use No  . Drug use: No  . Sexual activity: Not Currently   Other Topics Concern  . Not on file   Social  History Narrative  . No narrative on file    Allergies  Allergen Reactions  . Morphine And Related Itching and Rash    Current Facility-Administered Medications  Medication Dose Route Frequency Provider Last Rate Last Dose  . acetaminophen (TYLENOL) tablet 650 mg  650 mg Oral Q6H PRN Ivin Poot, MD       Or  . acetaminophen (TYLENOL) suppository 650 mg  650 mg Rectal Q6H PRN Ivin Poot, MD      . aspirin EC tablet 81 mg  81 mg Oral Daily Ivin Poot, MD      . dextrose 5 %-0.45 % sodium chloride infusion   Intravenous Continuous Ivin Poot, MD      . docusate sodium (COLACE) capsule 100 mg  100 mg Oral BID Ivin Poot, MD      . enoxaparin (LOVENOX) injection 30 mg  30 mg Subcutaneous Q24H Ivin Poot, MD      . feeding supplement (ENSURE ENLIVE) (ENSURE ENLIVE) liquid 237 mL  237 mL Oral TID WC Ivin Poot, MD      . [facility]) 12 hr tablet 600 mg  600 mg Oral BID Ivin Poot, MD      . Derrill Memo ON 03/07/2016] lisinopril (PRINIVIL,ZESTRIL) tablet 10 mg  10 mg Oral Daily Ivin Poot, MD      . magnesium citrate solution 1 Bottle  1 Bottle Oral Once PRN Ivin Poot, MD      . multivitamin with minerals tablet 1 tablet  1 tablet Oral Daily Ivin Poot, MD      . ondansetron [facility]) tablet 4 mg  4 mg Oral Q6H PRN Ivin Poot, MD       Or  . ondansetron [facility]) injection 4 mg  4 mg Intravenous Q6H PRN Ivin Poot, MD      . pantoprazole (PROTONIX) EC tablet 40 mg  40 mg Oral Q1200 Ivin Poot, MD      . piperacillin-tazobactam (ZOSYN) IVPB 3.375 g  3.375 g Intravenous Q8H Ivin Poot, MD      . sodium chloride flush (NS) 0.9 % injection 3 mL  3 mL Intravenous Q12H Ivin Poot, MD      . sorbitol 70 % solution 30 mL  30 mL Oral Daily  PRN Ivin Poot, MD      . Derrill Memo ON 03/07/2016] vancomycin (VANCOCIN) 1,250 mg in sodium chloride 0.9 % 250 mL IVPB  1,250 mg Intravenous Q24H Skeet Simmer, [facility]       Current  Outpatient Prescriptions  Medication Sig Dispense Refill  . acetaminophen (TYLENOL) 500 MG tablet Take 1 tablet (500 mg total) by mouth every 6 (six) hours as needed for mild pain. (Patient taking differently: Take 1,000 mg by mouth every 6 (six) hours as needed for mild pain. ) 30 tablet 0  . alendronate (FOSAMAX) 70 MG tablet Take 70 mg by mouth every 14 (fourteen) days. Last dose 02/13/16    . aspirin EC 81 MG tablet Take 81 mg by mouth at bedtime.    [name] lisinopril (PRINIVIL,ZESTRIL) 20 MG tablet Take 20 mg by mouth at bedtime.     [name] loratadine (CLARITIN) 10 MG tablet Take 10 mg by mouth daily.    [name] oxyCODONE (OXY IR/ROXICODONE) 5 MG immediate release tablet Take 1-2 tablets (5-10 mg total) by mouth every 6 (six) hours as needed for moderate pain or severe pain. 30 tablet 0     (Not in a hospital admission)  Family History  Problem Relation Age of Onset  . Heart failure Mother   . Colon cancer Sister      Review of Systems:       Cardiac Review of Systems: Y or N  Chest Pain [  No  ]  Resting SOB [  yes ] Exertional SOB  [place].Blacker  ]  Orthopnea [no]   Pedal Edema [ no  ]    Palpitations [ no ] Syncope  [ no ]   Presyncope [  yes today ]  General Review of Systems: [Y] = yes [  ]=no Constitional: recent weight change [  ]; anorexia [  ]; fatigue [ yes ]; nausea [  ]; night sweats [  ]; fever [place].Blacker today  ]; or chills [place].Blacker today  ]                                                               Dental: poor dentition[  ]; Last Dentist visit: 6 months  Eye : blurred vision [  ]; diplopia [   ]; vision changes [  ];  Amaurosis fugax[  ]; Resp: cough [  ];  wheezing[  ];  hemoptysis[  ]; shortness of breath[  ]; paroxysmal nocturnal dyspnea[  ]; dyspnea on exertion[  ]; or orthopnea[  ]; history of small cell carcinoma lung-treated GI:  gallstones[  ], vomiting[  ];  dysphagia[  ]; melena[  ];  hematochezia [  ]; heartburn[  ];   Hx of  Colonoscopy[  ]; GU: kidney stones [  ]; hematuria[  ];    dysuria [  ];  nocturia[  ];  history of     obstruction [  ]; urinary frequency [  ]             Skin: rash, swelling[  ];, hair loss[  ];  peripheral edema[  ];  or itching[  ]; Musculosketetal: myalgias[  ];  joint swelling[  ];  joint erythema[  ];  joint pain[ status post right total knee replacement ];  back pain[  ];  Heme/Lymph: bruising[  ];  bleeding[  ];  anemia[ yes chronic ];  Neuro: TIA[  ];  headaches[  ];  stroke[  ];  vertigo[  ];  seizures[  ];   paresthesias[  ];  difficulty walking[  ];  Psych:depression[  ]; anxiety[  ];  Endocrine: diabetes[  ];  thyroid dysfunction[  ];  Immunizations: Flu [  ]; Pneumococcal[  ];  Other: Right-hand dominant  Physical Exam: BP 123/65   Pulse 94   Temp 101.6 F (38.7 C) (Oral)   Resp 23   Ht '5\' 10"'$  (1.778 m)   Wt 178 lb (80.7 kg)   SpO2 100%   BMI 25.54 kg/m        Physical Exam  General: Alert well-nourished but  elderly Caucasian male no acute distress HEENT: Normocephalic pupils equal , dentition adequate Neck: Supple without JVD, adenopathy, or bruit Chest: Diminished breath sounds at left base, tubular breath sounds, i, no tenderness             or deformity. Left Pleurx catheter dressing clean, dry and intact Cardiovascular: Regular rate and rhythm, no murmur, no gallop, peripheral pulses             palpable in all extremities Abdomen:  Soft, nontender, no palpable mass or organomegaly Extremities: Warm, well-perfused, no clubbing cyanosis edema or tenderness,              no venous stasis changes of the legs Rectal/GU: Deferred Neuro: Grossly non--focal and symmetrical throughout Skin: Clean and dry without rash or ulceration   Diagnostic Studies & Laboratory data:     Recent Radiology Findings:   Dg Chest Port 1 View  Result Date: 03/06/2016 CLINICAL DATA:  Sepsis.  Left effusion. EXAM: PORTABLE CHEST 1 VIEW COMPARISON:  03/03/2016 FINDINGS: Left basilar chest tube in place. Decreasing left pleural  effusion. Small left effusion persists with left lower lobe atelectasis or infiltrate. No pneumothorax. Right lung is clear. Heart is upper limits normal in size. IMPRESSION: Left basilar chest tube with decreasing left effusion. Small left effusion remains. Left lower lobe atelectasis or infiltrate. Electronically Signed   By: Rolm Baptise M.D.   On: 03/06/2016 10:26    I have independently reviewed the above radiologic studies.  Recent Lab Findings: Lab Results  Component Value Date   WBC 19.1 (H) 03/06/2016   HGB 8.8 (L) 03/06/2016   HCT 27.4 (L) 03/06/2016   PLT 254 03/06/2016   GLUCOSE 162 (H) 03/06/2016   CHOL 156 10/25/2014   TRIG 96 10/25/2014   HDL 36 (L) 10/25/2014   LDLCALC 101 (H) 10/25/2014   ALT 40 03/06/2016   AST 23 03/06/2016   NA 133 (L) 03/06/2016   K 4.4 03/06/2016   CL 103 03/06/2016   CREATININE 1.39 (H) 03/06/2016   BUN 29 (H) 03/06/2016   CO2 22 03/06/2016   TSH 1.762 10/24/2014   INR 1.20 03/02/2016   HGBA1C 6.6 (H) 10/24/2014  History of small cell carcinoma left lung status post chemoradiation Recent onset of recurrent left nonmalignant pleural effusion Acute presentation with left lower lobe pneumonia, fever, leukocytosis and weakness   Assessment / Plan:     Patient will be admitted and treated with IV antibiotics and observation for acute left lower lobe pneumonia Blood cultures taken in the ED will be followed up. Urine culture has been ordered  Pleurx cath will be drained daily and the pleural fluid will be cultured. Patient's  appointment with Dr. [name] to discuss his history lung of cancer will be rescheduled If the pleural fluid is infected the Pleurx catheter may need to be removed. Plan for admission and plan of care was discussed with patient and family and they understand and agree.    I    '@ME1'$ @ 03/06/2016 12:51 PM

## 2016-03-06 NOTE — ED Notes (Signed)
Dr Prescott Gum called nurse first to inform that pt is coming to ED. Pt needs to have chest xray, CBC, BMET done and then page Dr Prescott Gum at [phone], with results.

## 2016-03-06 NOTE — ED Notes (Signed)
Attempted report x1. 

## 2016-03-06 NOTE — ED Provider Notes (Signed)
[facility] DEPT Provider Note   CSN: [id number] Arrival date & time: 03/06/16  7829  First Provider Contact:  None       History   Chief Complaint Chief Complaint  Patient presents with  . Shortness of Breath  . Fever    HPI Travis Medina is a 82 y.o. male presents from home by EMS with fever and hypoxia. Patient states that he was recently admitted and discharged for left pleural effusion. He had to drainage is and now has a Pleurx catheter. Patient states that when he woke up this morning he felt weak in his legs and was noted to have a temperature of 101. Hypoxic to 83% for EMS, no history of oxygen requirement. Patient states he's had a little bit of a cough since discharge but it has not worsened. Denies headaches, vomiting, chest pain, increased shortness of breath, or abdominal pain. They called CT surgery, who is aware of patient.  HPI  Past Medical History:  Diagnosis Date  . Arthritis   . Essential hypertension   . Hypertension   . Lung cancer ([place])   . S/P chemotherapy, time since 4-12 weeks   . S/P radiation therapy   . Shortness of breath dyspnea   . Wears glasses     Patient Active Problem List   Diagnosis Date Noted  . Pleural effusion 02/19/2016  . Hyponatremia 02/19/2016  . CKD (chronic kidney disease) 02/19/2016  . Elevated bilirubin 01/02/2015  . Antineoplastic chemotherapy induced anemia 01/02/2015  . Primary cancer of left upper lobe of lung ([place]) 11/20/2014  . Lung mass 11/14/2014  . Essential hypertension 10/23/2014  . Pathologic fracture of femur ([place]) 10/23/2014    Past Surgical History:  Procedure Laterality Date  . CHEST TUBE INSERTION Left 03/03/2016   Procedure: INSERTION PLEURAL DRAINAGE CATHETER;  Surgeon: Melrose Nakayama, MD;  Location: [facility];  Service: Thoracic;  Laterality: Left;  . COLONOSCOPY    . LUNG BIOPSY    . TOTAL KNEE ARTHROPLASTY     bilateral       Home Medications    Prior to Admission medications    Medication Sig Start Date End Date Taking? Authorizing Provider  acetaminophen (TYLENOL) 500 MG tablet Take 1 tablet (500 mg total) by mouth every 6 (six) hours as needed for mild pain. Patient taking differently: Take 1,000 mg by mouth every 6 (six) hours as needed for mild pain.  10/25/14   Florencia Reasons, MD  alendronate (FOSAMAX) 70 MG tablet Take 70 mg by mouth every 14 (fourteen) days. Last dose 02/13/16 06/09/15   Historical Provider, MD  aspirin EC 81 MG tablet Take 81 mg by mouth at bedtime.    Historical Provider, MD  lisinopril (PRINIVIL,ZESTRIL) 20 MG tablet Take 20 mg by mouth at bedtime.  10/05/15   Historical Provider, MD  loratadine (CLARITIN) 10 MG tablet Take 10 mg by mouth daily.    Historical Provider, MD  oxyCODONE (OXY IR/ROXICODONE) 5 MG immediate release tablet Take 1-2 tablets (5-10 mg total) by mouth every 6 (six) hours as needed for moderate pain or severe pain. 03/03/16   Melrose Nakayama, MD    Family History Family History  Problem Relation Age of Onset  . Heart failure Mother   . Colon cancer Sister     Social History Social History  Substance Use Topics  . Smoking status: Former Smoker    Packs/day: 1.00    Years: 25.00    Types: Cigarettes  Quit date: 08/09/1975  . Smokeless tobacco: Never Used  . Alcohol use No     Allergies   Morphine and related   Review of Systems Review of Systems  Constitutional: Positive for fever.  Respiratory: Positive for cough. Negative for shortness of breath.   Cardiovascular: Negative for chest pain.  Gastrointestinal: Negative for vomiting.  Genitourinary: Negative for dysuria.  Neurological: Positive for weakness.  All other systems reviewed and are negative.    Physical Exam Updated Vital Signs SpO2 (!) 83%   Physical Exam  Constitutional: He is oriented to person, place, and time. He appears well-developed and well-nourished. No distress.  HENT:  Head: Normocephalic and atraumatic.  Right Ear: External  ear normal.  Left Ear: External ear normal.  Nose: Nose normal.  Eyes: Right eye exhibits no discharge. Left eye exhibits no discharge.  Neck: Neck supple.  Cardiovascular: Regular rhythm, normal heart sounds and intact distal pulses.  Tachycardia present.   Pulmonary/Chest: Effort normal. No respiratory distress. He has decreased breath sounds in the left lower field.  Left chest catheter insertion site with no redness or drainage  Abdominal: Soft. There is no tenderness.  Musculoskeletal: He exhibits no edema.  Neurological: He is alert and oriented to person, place, and time.  Skin: Skin is warm and dry. He is not diaphoretic.  Nursing note and vitals reviewed.    ED Treatments / Results  Labs (all labs ordered are listed, but only abnormal results are displayed) Labs Reviewed  COMPREHENSIVE METABOLIC PANEL - Abnormal; Notable for the following:       Result Value   Sodium 133 (*)    Glucose, Bld 162 (*)    BUN 29 (*)    Creatinine, Ser 1.39 (*)    Calcium 8.7 (*)    Total Protein 5.7 (*)    Albumin 2.6 (*)    GFR calc non Af Amer 46 (*)    GFR calc Af Amer 54 (*)    All other components within normal limits  CBC WITH DIFFERENTIAL/PLATELET - Abnormal; Notable for the following:    WBC 19.1 (*)    RBC 2.88 (*)    Hemoglobin 8.8 (*)    HCT 27.4 (*)    Neutro Abs 18.1 (*)    Lymphs Abs 0.4 (*)    All other components within normal limits  CBC - Abnormal; Notable for the following:    WBC 22.1 (*)    RBC 2.72 (*)    Hemoglobin 8.2 (*)    HCT 25.8 (*)    All other components within normal limits  CREATININE, SERUM - Abnormal; Notable for the following:    Creatinine, Ser 1.37 (*)    GFR calc non Af Amer 47 (*)    GFR calc Af Amer 55 (*)    All other components within normal limits  I-STAT CG4 LACTIC ACID, ED - Abnormal; Notable for the following:    Lactic Acid, Venous 1.94 (*)    All other components within normal limits  CULTURE, BLOOD (ROUTINE X 2)  CULTURE,  BLOOD (ROUTINE X 2)  URINE CULTURE  SURGICAL PCR SCREEN  URINALYSIS, ROUTINE W REFLEX MICROSCOPIC (NOT AT [facility])  COMPREHENSIVE METABOLIC PANEL  CBC  I-STAT CG4 LACTIC ACID, ED    EKG  EKG Interpretation  Date/Time:  Sunday March 06 2016 09:45:22 EDT Ventricular Rate:  110 PR Interval:    QRS Duration: 81 QT Interval:  296 QTC Calculation: 401 R Axis:   12 Text Interpretation:  Sinus tachycardia Multiple ventricular premature complexes Confirmed by Regenia Skeeter MD, Skyelynn Rambeau [id number]) on 03/06/2016 10:13:25 AM       Radiology Dg Chest Port 1 View  Result Date: 03/06/2016 CLINICAL DATA:  Sepsis.  Left effusion. EXAM: PORTABLE CHEST 1 VIEW COMPARISON:  03/03/2016 FINDINGS: Left basilar chest tube in place. Decreasing left pleural effusion. Small left effusion persists with left lower lobe atelectasis or infiltrate. No pneumothorax. Right lung is clear. Heart is upper limits normal in size. IMPRESSION: Left basilar chest tube with decreasing left effusion. Small left effusion remains. Left lower lobe atelectasis or infiltrate. Electronically Signed   By: Rolm Baptise M.D.   On: 03/06/2016 10:26   Procedures Procedures (including critical care time)  Medications Ordered in ED Medications  sodium chloride 0.9 % bolus 1,000 mL (not administered)    And  sodium chloride 0.9 % bolus 1,000 mL (not administered)    And  sodium chloride 0.9 % bolus 500 mL (not administered)  ceFEPIme (MAXIPIME) 2 g in dextrose 5 % 50 mL IVPB (not administered)  vancomycin (VANCOCIN) IVPB 1000 mg/200 mL premix (not administered)     Initial Impression / Assessment and Plan / ED Course  I have reviewed the triage vital signs and the nursing notes.  Pertinent labs & imaging results that were available during my care of the patient were reviewed by me and considered in my medical decision making (see chart for details).  Clinical Course  Comment By Time  Will call code sepsis given hypotension by EMS and  fever, tachycardia and BP in 90s. Fluids, chest xray, labs, plan to call Dr. Prescott Gum when results are back.  Sherwood Gambler, MD 07/30 [phone]  Dr. Prescott Gum will come down and admit patient after he is finished with a central line in ICU Sherwood Gambler, MD 07/30 1109  Repeat sepsis assessment completed.  Sherwood Gambler, MD 07/30 1203     Final Clinical Impressions(s) / ED Diagnoses   Final diagnoses:  SOB (shortness of breath)    New Prescriptions New Prescriptions   No medications on file     Sherwood Gambler, MD 03/06/16 [phone]

## 2016-03-06 NOTE — Progress Notes (Signed)
Pharmacy Antibiotic Note  Travis Medina is a 82 y.o. male in ED on 03/06/2016 with .  Pharmacy has been consulted for Vancomycin and Cefepime dosing for HCAP coverage.   Hx lung caner, prior chemo and radiation completed about 1 year ago. Inpatient 7/14-7/17/17 with pleural effusion, s/p drainage. S/p PleurX catheter placement on 03/03/16 for malignant pleural effusions.  Temp to 101, WBC 19.1, LA 1.94.  Blood and urine cultures sent.   Vancomycin 1gm IV and Cefepime 2 gm IV given in ED.  Plan:  Continue Vancomycin with 1250 mg IV q24hrs. Next dose in am.  Continue Cefepime with 2 gm IV q24hrs. Next dose in am.  Target Vanc troughs 15-20 mcg/ml.  Follow renal function, culture data, progress.   Height: '5\' 10"'$  (177.8 cm) Weight: 178 lb (80.7 kg) IBW/kg (Calculated) : 73  Temp (24hrs), Avg:101.6 F (38.7 C), Min:101.6 F (38.7 C), Max:101.6 F (38.7 C)   Recent Labs Lab 03/02/16 0923 03/03/16 0824 03/06/16 0946 03/06/16 0957  WBC 9.6  --  19.1*  --   CREATININE 1.22 1.29* 1.39*  --   LATICACIDVEN  --   --   --  1.94*    Estimated Creatinine Clearance: 43.8 mL/min (by C-G formula based on SCr of 1.39 mg/dL).    Allergies  Allergen Reactions  . Morphine And Related Itching and Rash    Antimicrobials this admission:    Vanc 7/30>>    Cefepime 7/30>>  Dose adjustments this admission:   n/a  Microbiology results: 7/30 blood x 2 - 7/30 urine -  Prior cultures:  7/26 MRSA screen neg  7/15 left pleural fluid - negative  Arty Baumgartner, [place] Pager: [phone] 03/06/2016 10:57 AM

## 2016-03-07 ENCOUNTER — Inpatient Hospital Stay (HOSPITAL_COMMUNITY): Payer: Medicare Other

## 2016-03-07 ENCOUNTER — Other Ambulatory Visit: Payer: Self-pay | Admitting: Medical Oncology

## 2016-03-07 DIAGNOSIS — C3490 Malignant neoplasm of unspecified part of unspecified bronchus or lung: Secondary | ICD-10-CM

## 2016-03-07 LAB — CBC
HCT: 23.5 % — ABNORMAL LOW (ref 39.0–52.0)
Hemoglobin: 7.6 g/dL — ABNORMAL LOW (ref 13.0–17.0)
MCH: 30.6 pg (ref 26.0–34.0)
MCHC: 32.3 g/dL (ref 30.0–36.0)
MCV: 94.8 fL (ref 78.0–100.0)
Platelets: 212 10*3/uL (ref 150–400)
RBC: 2.48 MIL/uL — ABNORMAL LOW (ref 4.22–5.81)
RDW: 12.8 % (ref 11.5–15.5)
WBC: 13 10*3/uL — ABNORMAL HIGH (ref 4.0–10.5)

## 2016-03-07 LAB — COMPREHENSIVE METABOLIC PANEL
ALT: 30 U/L (ref 17–63)
AST: 17 U/L (ref 15–41)
Albumin: 2 g/dL — ABNORMAL LOW (ref 3.5–5.0)
Alkaline Phosphatase: 65 U/L (ref 38–126)
Anion gap: 4 — ABNORMAL LOW (ref 5–15)
BUN: 24 mg/dL — ABNORMAL HIGH (ref 6–20)
CO2: 24 mmol/L (ref 22–32)
Calcium: 8.2 mg/dL — ABNORMAL LOW (ref 8.9–10.3)
Chloride: 110 mmol/L (ref 101–111)
Creatinine, Ser: 1.24 mg/dL (ref 0.61–1.24)
GFR calc Af Amer: >60 mL/min (ref >60)
GFR calc non Af Amer: 53 mL/min — ABNORMAL LOW (ref >60)
Glucose, Bld: 107 mg/dL — ABNORMAL HIGH (ref 65–99)
Potassium: 4.3 mmol/L (ref 3.5–5.1)
Sodium: 138 mmol/L (ref 135–145)
Total Bilirubin: 0.8 mg/dL (ref 0.3–1.2)
Total Protein: 5.2 g/dL — ABNORMAL LOW (ref 6.5–8.1)

## 2016-03-07 LAB — URINE CULTURE: Culture: NO GROWTH

## 2016-03-07 MED ORDER — SODIUM CHLORIDE 0.9 % IV SOLN: 100.0000 mg | Freq: Once | INTRAVENOUS | Status: DC

## 2016-03-07 MED ORDER — IRON DEXTRAN 50 MG/ML IJ SOLN
100.0000 mg | Freq: Once | INTRAMUSCULAR | Status: AC
  Administered 2016-03-07: 100 mg via INTRAVENOUS
  Filled 2016-03-07 (×2): qty 2

## 2016-03-07 MED ORDER — SODIUM CHLORIDE 0.9 % IV SOLN
25.0000 mg | Freq: Once | INTRAVENOUS | Status: AC
  Administered 2016-03-07: 25 mg via INTRAVENOUS
  Filled 2016-03-07: qty 0.5

## 2016-03-07 NOTE — Progress Notes (Addendum)
[address]       [place], [address]             [phone]         Subjective: Feeling better, appears to be defervescing  Objective: Vital signs in last 24 hours: Temp:  [97.7 F (36.5 C)-101.6 F (38.7 C)] 97.7 F (36.5 C) (07/31 0332) Pulse Rate:  [61-111] 69 (07/31 0337) Cardiac Rhythm: Heart block;Normal sinus rhythm (07/31 0729) Resp:  [17-28] 27 (07/31 0337) BP: (93-123)/(52-67) 105/67 (07/31 0337) SpO2:  [98 %-100 %] 100 % (07/31 0337) FiO2 (%):  [36 %] 36 % (07/30 1232) Weight:  [178 lb (80.7 kg)-181 lb 14.1 oz (82.5 kg)] 181 lb 14.1 oz (82.5 kg) (07/30 1315)  Hemodynamic parameters for last 24 hours:    Intake/Output from previous day: 07/30 0701 - 07/31 0700 In: 1282.5 [P.O.:1080; I.V.:202.5] Out: 1000 [Urine:1000] Intake/Output this shift: No intake/output data recorded.  General appearance: alert, cooperative and no distress Heart: regular rate and rhythm and occas ectopic Lungs: min dim in bases,no rales or ronchi Abdomen: benign Extremities: no edema Wound: pleurx dressing CDI, no erethema or drainade noted  Lab Results:  Recent Labs  03/06/16 1258 03/07/16 0345  WBC 22.1* 13.0*  HGB 8.2* 7.6*  HCT 25.8* 23.5*  PLT 237 212   BMET:  Recent Labs  03/06/16 0946 03/06/16 1258 03/07/16 0345  NA 133*  --  138  K 4.4  --  4.3  CL 103  --  110  CO2 22  --  24  GLUCOSE 162*  --  107*  BUN 29*  --  24*  CREATININE 1.39* 1.37* 1.24  CALCIUM 8.7*  --  8.2*    PT/INR: No results for input(s): LABPROT, INR in the last 72 hours. ABG No results found for: PHART, HCO3, TCO2, ACIDBASEDEF, O2SAT CBG (last 3)  No results for input(s): GLUCAP in the last 72 hours.  Results for orders placed or performed during the hospital encounter of 03/02/16  Surgical pcr screen     Status: None   Collection Time: 03/02/16  9:23 AM  Result Value Ref Range Status   MRSA, PCR NEGATIVE NEGATIVE Final   Staphylococcus aureus NEGATIVE  NEGATIVE Final    Comment:        The Xpert SA Assay (FDA approved for NASAL specimens in patients over 76 years of age), is one component of a comprehensive surveillance program.  Test performance has been validated by [facility] for patients greater than or equal to 43 year old. It is not intended to diagnose infection nor to guide or monitor treatment.      Meds Scheduled Meds: . aspirin EC  81 mg Oral QHS  . docusate sodium  100 mg Oral BID  . enoxaparin (LOVENOX) injection  30 mg Subcutaneous Q24H  . feeding supplement (ENSURE ENLIVE)  237 mL Oral TID WC  . guaiFENesin  600 mg Oral BID  . lisinopril  10 mg Oral Daily  . multivitamin with minerals  1 tablet Oral Daily  . pantoprazole  40 mg Oral Daily  . piperacillin-tazobactam (ZOSYN)  IV  3.375 g Intravenous Q8H  . sodium chloride flush  3 mL Intravenous Q12H  . vancomycin  1,250 mg Intravenous Q24H   Continuous Infusions: . dextrose 5 % and 0.45% NaCl 50 mL/hr at 03/06/16 1900   PRN Meds:.acetaminophen **OR** acetaminophen, magnesium citrate, ondansetron **OR** ondansetron (ZOFRAN) IV, sorbitol, traMADol  Xrays Portable Chest 1  View  Result Date: 03/07/2016 CLINICAL DATA:  Shortness of breath EXAM: PORTABLE CHEST 1 VIEW COMPARISON:  03/06/2016 FINDINGS: Cardiac shadow is stable. A left PleurX catheter is noted. Increasing left upper lobe infiltrate is noted along the mediastinal border. Mild right upper lobe atelectatic changes are noted as well. Persistent small effusion and infiltrate is noted in the left base. IMPRESSION: When compared with the prior exam there is increased in the bilateral upper lobe infiltrate worse on the left than the right. Electronically Signed   By: Inez Catalina M.D.   On: 03/07/2016 07:45  Dg Chest Port 1 View  Result Date: 03/06/2016 CLINICAL DATA:  Sepsis.  Left effusion. EXAM: PORTABLE CHEST 1 VIEW COMPARISON:  03/03/2016 FINDINGS: Left basilar chest tube in place. Decreasing left  pleural effusion. Small left effusion persists with left lower lobe atelectasis or infiltrate. No pneumothorax. Right lung is clear. Heart is upper limits normal in size. IMPRESSION: Left basilar chest tube with decreasing left effusion. Small left effusion remains. Left lower lobe atelectasis or infiltrate. Electronically Signed   By: Rolm Baptise M.D.   On: 03/06/2016 10:26   Assessment/Plan:   1 stable 2 fevers - none since yesterday at 9 am, leukocytosis improved, no cough or sputum production. No hemoptysis. Cultures pending -cont current abx.  3 infiltrates do appear worse/ more consolidated on CXR however 4 no significant effusion on CXR 5 Creat is improved- monitor     LOS: 1 day    GOLD,WAYNE E 03/07/2016 Drain pleurx daily Cont vanc- Zosyn IV iron dextran for low Hct 22.2     patient examined and medical record reviewed,agree with above note. Tharon Aquas Trigt III 03/07/2016

## 2016-03-07 NOTE — Care Management Important Message (Signed)
Important Message  Patient Details  Name: Travis Medina MRN: [id number] Date of Birth: 1934-03-03   Medicare Important Message Given:  Yes    [name] 03/07/2016, 3:27 PM

## 2016-03-07 NOTE — Care Management Note (Signed)
Case Management Note  Patient Details  Name: Travis Medina MRN: [id number] Date of Birth: 1933-10-28  Subjective/Objective:    Patient is from home with spouse, he is active with [facility] for [facility] for pluerx drains. Presents with Sepsis, hcap, fever 101.6, tachy, hypotensive with syst in 90's,  ivf's, vanc iv, zosyn iv , wbc 19.1, hgb 8.8.  NCM will cont to follow for dc needs.                Action/Plan:   Expected Discharge Date:                  Expected Discharge Plan:  [place]  In-House Referral:     Discharge planning Services  CM Consult  Post Acute Care Choice:  Resumption of Svcs/PTA Provider Choice offered to:     DME Arranged:    DME Agency:     HH Arranged:  RN [name] Agency:  [name]  Status of Service:  In process, will continue to follow  If discussed at Long Length of Stay Meetings, dates discussed:    Additional Comments:  [name], RN 03/07/2016, 3:48 PM

## 2016-03-07 NOTE — Evaluation (Signed)
Physical Therapy Evaluation Patient Details Name: Travis Medina MRN: [id number] DOB: [birth year] Today's Date: 03/07/2016   History of Present Illness  82 y.o. male admitted to [facility] on 03/06/16 for SOB and fever.  Pt dx with recurrent L nonmalignant pleural effusion, acute left lower lobe PNA s/p plurex catheter drainage.  Pt with significant PMHx of SOB, lung CA (s/p chemo and radiation therapy), essential HTN, bil TKA, and insertion of plurex catheter in L lung on 03/03/16.    Clinical Impression  Pt mobilized well with some mild gait instability and 2-3/4 DOE with gait.  Some mild drop in O2 to 87% on RA, however, rebounded quickly in <30 seconds with standing rest break and pursed lip breathing.  PT will follow acutely, however, we do no anticipate he will need f/u at discharge.      Follow Up Recommendations No PT follow up    Equipment Recommendations  None recommended by PT    Recommendations for Other Services   NA    Precautions / Restrictions Precautions Precautions: Fall;Other (comment) Precaution Comments: Mildly unsteady on his feet, monitor O2 sats and DOE Restrictions Weight Bearing Restrictions: No      Mobility  Bed Mobility Overal bed mobility: Modified Independent                Transfers Overall transfer level: Needs assistance   Transfers: Sit to/from Stand Sit to Stand: Supervision         General transfer comment: supervision for safety  Ambulation/Gait Ambulation/Gait assistance: Supervision Ambulation Distance (Feet): 300 Feet Assistive device: None Gait Pattern/deviations: Step-through pattern;Staggering left;Staggering right     General Gait Details: Pt with mildly staggering gait pattern especially with head turns during gait.  Min guard assist for safety during gait DOE increased with increased distance 2-3/4 O2 sats dropped briefly to 88% on RA which rebounded quickly (<30 seconds) with standing rest break and cues for pursed lip  breathing.          Balance Overall balance assessment: Needs assistance Sitting-balance support: Feet supported;No upper extremity supported Sitting balance-Leahy Scale: Good     Standing balance support: No upper extremity supported Standing balance-Leahy Scale: Good                               Pertinent Vitals/Pain Pain Assessment: No/denies pain Faces Pain Scale: No hurt    Home Living Family/patient expects to be discharged to:: Private residence Living Arrangements: Spouse/significant other Available Help at Discharge: Family;Available 24 hours/day Type of Home: House Home Access: Stairs to enter Entrance Stairs-Rails:  (" a rail" did not specify which side) Entrance Stairs-Number of Steps: 2 Home Layout: One level Home Equipment: Walker - 2 wheels;Cane - single point Additional Comments: 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         Extremity/Trunk Assessment   Upper Extremity Assessment: Overall WFL for tasks assessed           Lower Extremity Assessment: RLE deficits/detail;LLE deficits/detail RLE Deficits / Details: H/o bil TKAs giving him a slightly abnormal gait pattern, however, MMT of Bil legs is 5/5 throughout.  LLE Deficits / Details: H/o bil TKAs giving him a slightly abnormal gait pattern, however, MMT  of Bil legs is 5/5 throughout.   Cervical / Trunk Assessment: Normal  Communication   Communication: No difficulties  Cognition Arousal/Alertness: Awake/alert Behavior During Therapy: WFL for tasks assessed/performed Overall Cognitive Status: Within Functional Limits for tasks assessed                               Assessment/Plan    PT Assessment Patient needs continued PT services  PT Diagnosis Difficulty walking;Abnormality  of gait;Generalized weakness   PT Problem List Decreased strength;Decreased activity tolerance;Decreased mobility;Decreased balance;Cardiopulmonary status limiting activity  PT Treatment Interventions DME instruction;Gait training;Stair training;Functional mobility training;Therapeutic activities;Therapeutic exercise;Balance training;Patient/family education   PT Goals (Current goals can be found in the Care Plan section) Acute Rehab PT Goals Patient Stated Goal: to get home soon PT Goal Formulation: With patient Time For Goal Achievement: 03/21/16 Potential to Achieve Goals: Good    Frequency Min 3X/week           End of Session Equipment Utilized During Treatment: Gait belt Activity Tolerance: Patient limited by fatigue Patient left: in chair;with call bell/phone within reach;with chair alarm set Nurse Communication: Mobility status         Time: 1430-1443 PT Time Calculation (min) (ACUTE ONLY): 13 min   Charges:   PT Evaluation $PT Eval Moderate Complexity: 1 Procedure          Jaquez Farrington B. Nelson, [place], DPT [phone]   03/07/2016, 10:22 PM

## 2016-03-07 NOTE — Progress Notes (Signed)
Patient tolerated Pleurex draining well. About 200 mL of clear, serosanguinous fluid was removed. Patient had some pain but was relieved with PRN medication. Pt up in chair and did walk the unit today. Pt has been reaching about 1250 on incentive spirometer.

## 2016-03-07 NOTE — Progress Notes (Signed)
[facility]  Patient Status: Active (receiving services up to time of hospitalization)  [facility] is providing the following services: RN  If patient discharges after hours, please call [phone].   Travis Medina 03/07/2016, 10:09 AM

## 2016-03-08 ENCOUNTER — Other Ambulatory Visit: Payer: Medicare Other

## 2016-03-08 ENCOUNTER — Ambulatory Visit: Payer: Medicare Other | Admitting: Internal Medicine

## 2016-03-08 ENCOUNTER — Inpatient Hospital Stay (HOSPITAL_COMMUNITY): Payer: Medicare Other

## 2016-03-08 LAB — CBC
HCT: 24.6 % — ABNORMAL LOW (ref 39.0–52.0)
Hemoglobin: 8 g/dL — ABNORMAL LOW (ref 13.0–17.0)
MCH: 30.9 pg (ref 26.0–34.0)
MCHC: 32.5 g/dL (ref 30.0–36.0)
MCV: 95 fL (ref 78.0–100.0)
Platelets: 237 10*3/uL (ref 150–400)
RBC: 2.59 MIL/uL — ABNORMAL LOW (ref 4.22–5.81)
RDW: 12.9 % (ref 11.5–15.5)
WBC: 9 10*3/uL (ref 4.0–10.5)

## 2016-03-08 LAB — COMPREHENSIVE METABOLIC PANEL
ALT: 30 U/L (ref 17–63)
AST: 22 U/L (ref 15–41)
Albumin: 2.1 g/dL — ABNORMAL LOW (ref 3.5–5.0)
Alkaline Phosphatase: 78 U/L (ref 38–126)
Anion gap: 7 (ref 5–15)
BUN: 26 mg/dL — ABNORMAL HIGH (ref 6–20)
CO2: 24 mmol/L (ref 22–32)
Calcium: 8.3 mg/dL — ABNORMAL LOW (ref 8.9–10.3)
Chloride: 107 mmol/L (ref 101–111)
Creatinine, Ser: 1.33 mg/dL — ABNORMAL HIGH (ref 0.61–1.24)
GFR calc Af Amer: 57 mL/min — ABNORMAL LOW (ref >60)
GFR calc non Af Amer: 49 mL/min — ABNORMAL LOW (ref >60)
Glucose, Bld: 105 mg/dL — ABNORMAL HIGH (ref 65–99)
Potassium: 4.2 mmol/L (ref 3.5–5.1)
Sodium: 138 mmol/L (ref 135–145)
Total Bilirubin: 0.3 mg/dL (ref 0.3–1.2)
Total Protein: 5.5 g/dL — ABNORMAL LOW (ref 6.5–8.1)

## 2016-03-08 NOTE — Progress Notes (Signed)
Patient walked a lap around the unit which was about 300 feet. Pt did become short of breath but oxygen saturation stayed within normal limits but respirations increased to upper 20's to 30. Patient was able to catch his breath after a few minutes of rest. Will continue to monitor and encourage ambulation.

## 2016-03-08 NOTE — Care Management Note (Signed)
Case Management Note  Patient Details  Name: Travis Medina MRN: [id number] Date of Birth: [birth year]  Subjective/Objective:    Patient is from home with spouse, he is active with [facility] for [facility] for pluerx drains. [facility] has come out twice and were planning to show wife how to drain on Sunday but patient came back to hospital.  Patient will like to resume with [facility], Butch Penny with [facility] notified patient here in hospital.   Presents with Sepsis, hcap, fever 101.6, tachy, hypotensive with syst in 90's,  ivf's, vanc iv, zosyn iv , wbc 19.1, hgb 8.8.  NCM will cont to follow for dc needs.                             Action/Plan:   Expected Discharge Date:                  Expected Discharge Plan:  [place]  In-House Referral:     Discharge planning Services  CM Consult  Post Acute Care Choice:  Resumption of Svcs/PTA Provider Choice offered to:  Patient  DME Arranged:    DME Agency:     HH Arranged:  RN [place] Agency:  [place]  Status of Service:  Completed, signed off  If discussed at [address] of Stay Meetings, dates discussed:    Additional Comments:  [name], RN 03/08/2016, 3:55 PM

## 2016-03-08 NOTE — Progress Notes (Signed)
[address]       [place] [address]             [phone]         Subjective: conts to feel better, no new c/o  Objective: Vital signs in last 24 hours: Temp:  [97.6 F (36.4 C)-98.4 F (36.9 C)] 97.6 F (36.4 C) (08/01 0827) Pulse Rate:  [59-89] 71 (08/01 0827) Cardiac Rhythm: Normal sinus rhythm (07/31 2304) Resp:  [18-26] 18 (08/01 0827) BP: (96-131)/(54-68) 105/57 (08/01 0827) SpO2:  [96 %-100 %] 100 % (08/01 0827)  Hemodynamic parameters for last 24 hours:    Intake/Output from previous day: 07/31 0701 - 08/01 0700 In: 600 [P.O.:600] Out: 950 [Urine:750; Chest Tube:200] Intake/Output this shift: No intake/output data recorded.  General appearance: alert, cooperative and no distress Heart: regular rate and rhythm Lungs: clear to auscultation bilaterally Abdomen: benign Extremities: no edema Wound: incis healing well  Lab Results:  Recent Labs  03/07/16 0345 03/08/16 0448  WBC 13.0* 9.0  HGB 7.6* 8.0*  HCT 23.5* 24.6*  PLT 212 237   BMET:   Recent Labs  03/07/16 0345 03/08/16 0448  NA 138 138  K 4.3 4.2  CL 110 107  CO2 24 24  GLUCOSE 107* 105*  BUN 24* 26*  CREATININE 1.24 1.33*  CALCIUM 8.2* 8.3*    PT/INR: No results for input(s): LABPROT, INR in the last 72 hours. ABG No results found for: PHART, HCO3, TCO2, ACIDBASEDEF, O2SAT CBG (last 3)  No results for input(s): GLUCAP in the last 72 hours.  Meds Scheduled Meds: . aspirin EC  81 mg Oral QHS  . docusate sodium  100 mg Oral BID  . enoxaparin (LOVENOX) injection  30 mg Subcutaneous Q24H  . feeding supplement (ENSURE ENLIVE)  237 mL Oral TID WC  . guaiFENesin  600 mg Oral BID  . lisinopril  10 mg Oral Daily  . multivitamin with minerals  1 tablet Oral Daily  . pantoprazole  40 mg Oral Daily  . piperacillin-tazobactam (ZOSYN)  IV  3.375 g Intravenous Q8H  . sodium chloride flush  3 mL Intravenous Q12H  . vancomycin  1,250 mg Intravenous Q24H    Continuous Infusions: . dextrose 5 % and 0.45% NaCl 1,000 mL (03/08/16 0143)   PRN Meds:.acetaminophen **OR** acetaminophen, magnesium citrate, ondansetron **OR** ondansetron (ZOFRAN) IV, sorbitol, traMADol  Xrays Dg Chest 2 View  Result Date: 03/08/2016 CLINICAL DATA:  Shortness of breath, history of lung carcinoma, former smoking history portable chest x-ray of 03/07/2016 EXAM: CHEST  2 VIEW COMPARISON:  And CT chest of 02/19/2016 FINDINGS: There is little change in pleural and parenchymal opacity at the left lung base most consistent with effusion and volume loss. A left pleural catheter is present. Abnormal opacity overlying the aortic knob within the medial left upper lung field also is present and may represent primary or metastatic tumor. Somewhat prominent markings are present in the right upper lobe and pneumonia is a consideration. Heart size is stable. No acute bony abnormality is seen. IMPRESSION: 1. No change in pleural and parenchymal opacity at the left lung base consistent with left effusion and volume loss. Left chest tube remains. 2. No change in left suprahilar opacity worrisome for primary or metastatic disease. 3. Question patchy pneumonia in the right upper lobe. Electronically Signed   By: Ivar Drape M.D.   On: 03/08/2016 08:01   Portable Chest 1 View  Result Date: 03/07/2016 CLINICAL DATA:  Shortness of breath EXAM: PORTABLE CHEST 1 VIEW COMPARISON:  03/06/2016 FINDINGS: Cardiac shadow is stable. A left PleurX catheter is noted. Increasing left upper lobe infiltrate is noted along the mediastinal border. Mild right upper lobe atelectatic changes are noted as well. Persistent small effusion and infiltrate is noted in the left base. IMPRESSION: When compared with the prior exam there is increased in the bilateral upper lobe infiltrate worse on the left than the right. Electronically Signed   By: Inez Catalina M.D.   On: 03/07/2016 07:45  Dg Chest Port 1 View  Result Date:  03/06/2016 CLINICAL DATA:  Sepsis.  Left effusion. EXAM: PORTABLE CHEST 1 VIEW COMPARISON:  03/03/2016 FINDINGS: Left basilar chest tube in place. Decreasing left pleural effusion. Small left effusion persists with left lower lobe atelectasis or infiltrate. No pneumothorax. Right lung is clear. Heart is upper limits normal in size. IMPRESSION: Left basilar chest tube with decreasing left effusion. Small left effusion remains. Left lower lobe atelectasis or infiltrate. Electronically Signed   By: Rolm Baptise M.D.   On: 03/06/2016 10:26   Results for orders placed or performed during the hospital encounter of 03/06/16  Blood Culture (routine x 2)     Status: None (Preliminary result)   Collection Time: 03/06/16  9:46 AM  Result Value Ref Range Status   Specimen Description BLOOD RIGHT FOREARM  Final   Special Requests BOTTLES DRAWN AEROBIC AND ANAEROBIC 5CC  Final   Culture NO GROWTH 1 DAY  Final   Report Status PENDING  Incomplete  Blood Culture (routine x 2)     Status: None (Preliminary result)   Collection Time: 03/06/16 10:02 AM  Result Value Ref Range Status   Specimen Description BLOOD RIGHT HAND  Final   Special Requests BOTTLES DRAWN AEROBIC ONLY 5CC  Final   Culture NO GROWTH 1 DAY  Final   Report Status PENDING  Incomplete  Urine culture     Status: None   Collection Time: 03/06/16 11:17 AM  Result Value Ref Range Status   Specimen Description URINE, CLEAN CATCH  Final   Special Requests NONE  Final   Culture NO GROWTH  Final   Report Status 03/07/2016 FINAL  Final  Body fluid culture     Status: None (Preliminary result)   Collection Time: 03/07/16  3:58 PM  Result Value Ref Range Status   Specimen Description PLEURAL  Final   Special Requests NONE  Final   Gram Stain   Final    FEW WBC PRESENT, PREDOMINANTLY PMN NO ORGANISMS SEEN    Culture PENDING  Incomplete   Report Status PENDING  Incomplete    Assessment/Plan:  1 steady progress, without fevers and resolved  leukocytosis 2 CXR is stable in appearance 3 cultures neg so far 4 200 out pleurx yesterday  LOS: 2 days    GOLD,WAYNE E 03/08/2016

## 2016-03-09 MED ORDER — AMOXICILLIN-POT CLAVULANATE 875-125 MG PO TABS
1.0000 | ORAL_TABLET | Freq: Two times a day (BID) | ORAL | Status: DC
  Administered 2016-03-09 – 2016-03-10 (×2): 1 via ORAL
  Filled 2016-03-09 (×2): qty 1

## 2016-03-09 MED ORDER — DOXYCYCLINE HYCLATE 100 MG PO TABS
100.0000 mg | ORAL_TABLET | Freq: Two times a day (BID) | ORAL | Status: DC
  Administered 2016-03-09 – 2016-03-10 (×2): 100 mg via ORAL
  Filled 2016-03-09 (×2): qty 1

## 2016-03-09 NOTE — Care Management Important Message (Signed)
Important Message  Patient Details  Name: Travis Medina MRN: [id number] Date of Birth: Jun 16, 1934   Medicare Important Message Given:  Yes    Nathen May 03/09/2016, 10:47 AM

## 2016-03-09 NOTE — Anesthesia Postprocedure Evaluation (Signed)
Anesthesia Post Note  Patient: Travis Medina  Procedure(s) Performed: Procedure(s) (LRB): INSERTION PLEURAL DRAINAGE CATHETER (Left)  Patient location during evaluation: PACU Anesthesia Type: General Level of consciousness: awake Pain management: pain level controlled Respiratory status: spontaneous breathing Cardiovascular status: stable Anesthetic complications: no    Last Vitals:  Vitals:   03/03/16 1159 03/03/16 1214  BP: 104/65 110/63  Pulse: 64 67  Resp: (!) 24 13  Temp:      Last Pain:  Vitals:   03/03/16 1200  TempSrc:   PainSc: 1                  EDWARDS,Tou Hayner

## 2016-03-09 NOTE — Progress Notes (Addendum)
[address]       [place] [address]             [phone]         Subjective: Feels well  Objective: Vital signs in last 24 hours: Temp:  [97.6 F (36.4 C)-98.9 F (37.2 C)] 98.3 F (36.8 C) (08/02 0733) Pulse Rate:  [66-98] 69 (08/02 0733) Cardiac Rhythm: Normal sinus rhythm (08/02 0733) Resp:  [16-29] 24 (08/02 0733) BP: (92-112)/(53-68) 111/65 (08/02 0733) SpO2:  [95 %-100 %] 99 % (08/02 0733)  Hemodynamic parameters for last 24 hours:    Intake/Output from previous day: 08/01 0701 - 08/02 0700 In: 5350 [P.O.:1200; I.V.:3000; IV Piggyback:1150] Out: 750 [Urine:700; Chest Tube:50] Intake/Output this shift: Total I/O In: 240 [P.O.:240] Out: -   General appearance: alert, cooperative and no distress Heart: regular rate and rhythm Lungs: clear to auscultation bilaterally Abdomen: mild distension, nontender, + BS Extremities: min edema Wound: n/a  Lab Results:  Recent Labs  03/07/16 0345 03/08/16 0448  WBC 13.0* 9.0  HGB 7.6* 8.0*  HCT 23.5* 24.6*  PLT 212 237   BMET:  Recent Labs  03/07/16 0345 03/08/16 0448  NA 138 138  K 4.3 4.2  CL 110 107  CO2 24 24  GLUCOSE 107* 105*  BUN 24* 26*  CREATININE 1.24 1.33*  CALCIUM 8.2* 8.3*    PT/INR: No results for input(s): LABPROT, INR in the last 72 hours. ABG No results found for: PHART, HCO3, TCO2, ACIDBASEDEF, O2SAT CBG (last 3)  No results for input(s): GLUCAP in the last 72 hours.  Meds Scheduled Meds: . aspirin EC  81 mg Oral QHS  . docusate sodium  100 mg Oral BID  . enoxaparin (LOVENOX) injection  30 mg Subcutaneous Q24H  . feeding supplement (ENSURE ENLIVE)  237 mL Oral TID WC  . guaiFENesin  600 mg Oral BID  . lisinopril  10 mg Oral Daily  . multivitamin with minerals  1 tablet Oral Daily  . pantoprazole  40 mg Oral Daily  . piperacillin-tazobactam (ZOSYN)  IV  3.375 g Intravenous Q8H  . sodium chloride flush  3 mL Intravenous Q12H  . vancomycin  1,250 mg  Intravenous Q24H   Continuous Infusions: . dextrose 5 % and 0.45% NaCl 50 mL/hr at 03/09/16 0757   PRN Meds:.acetaminophen **OR** acetaminophen, magnesium citrate, ondansetron **OR** ondansetron (ZOFRAN) IV, sorbitol, traMADol  Xrays Dg Chest 2 View  Result Date: 03/08/2016 CLINICAL DATA:  Shortness of breath, history of lung carcinoma, former smoking history portable chest x-ray of 03/07/2016 EXAM: CHEST  2 VIEW COMPARISON:  And CT chest of 02/19/2016 FINDINGS: There is little change in pleural and parenchymal opacity at the left lung base most consistent with effusion and volume loss. A left pleural catheter is present. Abnormal opacity overlying the aortic knob within the medial left upper lung field also is present and may represent primary or metastatic tumor. Somewhat prominent markings are present in the right upper lobe and pneumonia is a consideration. Heart size is stable. No acute bony abnormality is seen. IMPRESSION: 1. No change in pleural and parenchymal opacity at the left lung base consistent with left effusion and volume loss. Left chest tube remains. 2. No change in left suprahilar opacity worrisome for primary or metastatic disease. 3. Question patchy pneumonia in the right upper lobe. Electronically Signed   By: Ivar Drape M.D.   On: 03/08/2016 08:01    Assessment/Plan:  1 clinically very  stable on current rx 2 poss transition to po abx soon   LOS: 3 days    GOLD,WAYNE E 03/09/2016  Patient with significant clinical improvement on IV antibiotics for possible left lower lobe pneumonia We'll transition to oral Augmentin and doxycycline with plans for discharge and outpatient follow-up. Dr. Roxan Hockey placed  Pleurx catheter 1 week ago.  Fluid drained from Pleurx catheter remains culture negative. White count now back to normal.  patient examined and medical record reviewed,agree with above note. Tharon Aquas Trigt III 03/09/2016

## 2016-03-09 NOTE — Progress Notes (Signed)
Pharmacy Antibiotic Note  Travis Medina is a 82 y.o. male admitted on 03/06/2016 with recurrent pleural effusion s/p thoracentesis x2. Day # 4 Vanc and zosyn for empiric ABX coverage. PleurX placed 7/27, draining daily. Appears to be clinically stable per CTS - possible transition to PO ABX soon. Currently afeb, WBC down to 9.0, LA normalized. SCr relatively stable - currently 1.33. Cultures negative so far.   Plan: -Continue Vanc 1250 mg IV q24hrs  -Continue Zosyn 3.375g q8h  -F/U cultures -Monitor renal function, clinical progress -F/U PO BAX plan - will order VT if plan >5 days of vancomycin tx or if significant chg in renal fxn   Height: '5\' 10"'$  (177.8 cm) Weight: 181 lb 14.1 oz (82.5 kg) IBW/kg (Calculated) : 73  Temp (24hrs), Avg:98.4 F (36.9 C), Min:98.1 F (36.7 C), Max:98.9 F (37.2 C)   Recent Labs Lab 03/03/16 0824 03/06/16 0946 03/06/16 0957 03/06/16 1258 03/06/16 1325 03/07/16 0345 03/08/16 0448  WBC  --  19.1*  --  22.1*  --  13.0* 9.0  CREATININE 1.29* 1.39*  --  1.37*  --  1.24 1.33*  LATICACIDVEN  --   --  1.94*  --  1.83  --   --     Estimated Creatinine Clearance: 45.7 mL/min (by C-G formula based on SCr of 1.33 mg/dL).    Allergies  Allergen Reactions  . Morphine And Related Itching and Rash    Antimicrobials this admission: Vanc 7/30>> Cefepime 7/30 x1 Zosyn 7/30>>  Microbiology results: 7/30 blood x 2: NGTD 7/30 urine: NGF- negative 7/31 pleural fluid: NGTD  Prior cultures:  7/26 MRSA screen neg  7/15 left pleural fluid - negative   Stephens November, PharmD Clinical Pharmacist 12:03 PM, 03/09/2016

## 2016-03-10 ENCOUNTER — Inpatient Hospital Stay (HOSPITAL_COMMUNITY): Payer: Medicare Other

## 2016-03-10 MED ORDER — OXYCODONE HCL 5 MG PO TABS: 5.0000 mg | ORAL_TABLET | Freq: Four times a day (QID) | ORAL | 0 refills | Status: DC | PRN

## 2016-03-10 MED ORDER — FE FUMARATE-B12-VIT C-FA-IFC PO CAPS
1.0000 | ORAL_CAPSULE | Freq: Three times a day (TID) | ORAL | Status: DC
  Administered 2016-03-10: 1 via ORAL
  Filled 2016-03-10 (×2): qty 1

## 2016-03-10 MED ORDER — FE FUMARATE-B12-VIT C-FA-IFC PO CAPS: 1.0000 | ORAL_CAPSULE | Freq: Every day | ORAL | 0 refills | Status: DC

## 2016-03-10 MED ORDER — DOXYCYCLINE HYCLATE 100 MG PO TABS: 100.0000 mg | ORAL_TABLET | Freq: Two times a day (BID) | ORAL | 0 refills | Status: DC

## 2016-03-10 MED ORDER — GUAIFENESIN ER 600 MG PO TB12: 600.0000 mg | ORAL_TABLET | Freq: Two times a day (BID) | ORAL | Status: DC | PRN

## 2016-03-10 MED ORDER — TRAMADOL HCL 50 MG PO TABS: 50.0000 mg | ORAL_TABLET | Freq: Four times a day (QID) | ORAL | 0 refills | Status: DC | PRN

## 2016-03-10 MED ORDER — LISINOPRIL 10 MG PO TABS: 20.0000 mg | ORAL_TABLET | Freq: Every day | ORAL | 1 refills | Status: DC

## 2016-03-10 MED ORDER — LISINOPRIL 10 MG PO TABS: 10.0000 mg | ORAL_TABLET | Freq: Every day | ORAL | 1 refills | Status: DC

## 2016-03-10 MED ORDER — AMOXICILLIN-POT CLAVULANATE 875-125 MG PO TABS: 1.0000 | ORAL_TABLET | Freq: Two times a day (BID) | ORAL | 0 refills | Status: DC

## 2016-03-10 NOTE — Progress Notes (Signed)
Discharge instructions reviewed with patient and his wife who is at bedside. Appointment information reviewed, and patient understands that he will need to get CXR completed prior to seeing the MD at this office. Home health services notified of patients discharge so they can resume their care, patient made aware and information given should they need to call them. Medications reviewed, information regarding antibiotics given, patient told to drink plenty of water while on antibiotics. Question regarding blood pressure medication arose, PA with [facility] called for clarification. Clarification made about blood pressure meds- only take one 10 mg tab daily. Patient verbalized understanding of instructions.   Patient in no acute distress, VSS, discharged via wheelchair to his home. No other needs expressed at this time.

## 2016-03-10 NOTE — Care Management Note (Signed)
Case Management Note  Patient Details  Name: Travis Medina MRN: [id number] Date of Birth: 08/22/1933  Subjective/Objective:     Patient is for discharge today, he is active with [facility] , NCM notified Butch Penny with [facility] , that patient is for dc today and will need pleurx drain tomorrow, (Mon , Wed, Fri).                Action/Plan:   Expected Discharge Date:                  Expected Discharge Plan:  [place]  In-House Referral:     Discharge planning Services  CM Consult  Post Acute Care Choice:  Resumption of Svcs/PTA Provider Choice offered to:  Patient  DME Arranged:    DME Agency:     HH Arranged:  RN [place] Agency:  [place]  Status of Service:  Completed, signed off  If discussed at [place] of Stay Meetings, dates discussed:    Additional Comments:  [name], RN 03/10/2016, 10:11 AM

## 2016-03-10 NOTE — Discharge Summary (Signed)
Physician Discharge Summary       [address]       [place] [address]             [phone]    Patient ID: Travis Medina MRN: [id number] DOB/AGE: [birth year] 82 y.o.  Admit date: 03/06/2016 Discharge date: 03/10/2016  Admission Diagnoses: 1. LLL pneumonia 2. Small cell carcinoma (s/p chemo radiaton) 3.Malignant left pleural effusion  Active Diagnoses:  1. Essential hypertension 2. Arthritis 3. 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 He was treated with chemoradiation as well as prophylactic brain radiation. Recently, he developed a recurrent left pleural effusion. Cytology was negative. However, CT scan of the chest performed earlier this month shows a new left upper lobe 2. or centimeter mass suspicious for recurrence as well as evidence of pleural nodules. The patient has required thoracentesis twice and a left Pleurx catheter was placed as an outpatient on 03/03/2016 by Dr. Roxan Hockey. 1.1 L of xanthochromic fluid was removed and there were no complications. For the first 2 days the patient did well. The Pleurx cath was drained last 2 days and was recorded as 500 cc and 200 cc. The patient woke up the morning of 07/30/2017weak with a fever 101 and difficulty standing. He presented to the emergency department and was found have a 101.6 temperature with a white count of 20,000. Chest x-ray shows probable left lower lobe pneumonia. The patient had soft blood pressure was given a 500 cc saline bolus. The patient's lactate was 1.9. Renal function is normal. Patient has chronic anemia with hematocrit 27%.  Patient is being admitted for IV antibiotic therapy of his HCAP  Pneumonia. Blood cultures urine cultures have been sent. Sputum culture pending.   Brief Hospital Course:  Patient has remained afebrile and hemodynamically  stable. He was initially put on Vancomycin and Zosyn. Blood, left pleural fluid, and urine cultures were negative. His leukocytosis gradually resolved and chest x ray showed gradual improvement of PNA. IV antibiotics were then stopped and he was put on Doxycycline and Augmentin. He is ambulating on room air. He is tolerating a diet. He has been seen by Dr. Prescott Gum today and is felt surgically stable for discharge on Doxy and Augmentin.   Latest Vital Signs: Blood pressure 122/66, pulse 69, temperature 98.7 F (37.1 C), resp. rate (!) 23, height '5\' 10"'$  (1.778 m), weight 181 lb 14.1 oz (82.5 kg), SpO2 100 %.  Physical Exam:   Discharge Condition:Stable and discharged to home.  Recent laboratory studies:  Lab Results  Component Value Date   WBC 9.0 03/08/2016   HGB 8.0 (L) 03/08/2016   HCT 24.6 (L) 03/08/2016   MCV 95.0 03/08/2016   PLT 237 03/08/2016   Lab Results  Component Value Date   NA 138 03/08/2016   K 4.2 03/08/2016   CL 107 03/08/2016   CO2 24 03/08/2016   CREATININE 1.33 (H) 03/08/2016   GLUCOSE 105 (H) 03/08/2016    Diagnostic Studies:   Dg Chest 2 View  Result Date: 03/10/2016 CLINICAL DATA:  Pneumonia EXAM: CHEST  2 VIEW COMPARISON:  03/08/2016 FINDINGS: Left-sided pleural catheter in stable position. Stable to decrease loculated left effusion and/or pleural thickening. Patchy airspace opacity greatest in the right upper lobe. Stable volume loss on the left with spiculated left paramediastinal nodule. Remote L1 compression fracture. Normal heart size. IMPRESSION: 1. Stable  positioning of left pleural catheter and residual small pleural effusion. 2. Stable right upper lobe pneumonia. Electronically Signed   By: Monte Fantasia M.D.   On: 03/10/2016 08:20   Ct Chest W Contrast  Result Date: 02/19/2016 CLINICAL DATA:  Evaluate left lung pleural effusion identified on chest radiograph. Progressive shortness of breath. EXAM: CT CHEST WITH CONTRAST TECHNIQUE: Multidetector  CT imaging of the chest was performed during intravenous contrast administration. CONTRAST:  68m ISOVUE-300 IOPAMIDOL (ISOVUE-300) INJECTION 61% COMPARISON:  12/29/2015 FINDINGS: Mediastinum/Lymph Nodes: The heart size appears normal. Aortic atherosclerosis identified. Calcification in the LAD coronary artery is identified. The trachea appears patent and is midline. Normal appearance of the esophagus. Sub- carinal lymph node measures 1.2 cm, image 67 of series 3. Previously this measured 7 mm. Lungs/Pleura: There is a very large left pleural effusion with near complete atelectasis and consolidation of the left lung accounting for the white out appearance of the left hemi thorax on chest radiograph. Subtle, small areas of suspected pleural nodularity are identified compatible with malignant effusion in trans pleural spread of tumor. Index lesion anteriorly measures 2.4 by 0.9 cm, image 89 of series 3. Posterior medially there is a pleural nodule measuring 2.2 by 0.7 cm, image 138 of series 3. Soft tissue attenuating mass within the left upper lobe measures 2.2 x 2.4 cm and is scan SPhoebe Sharpsfor recurrent tumor. Chronic interstitial changes are again noted involving the right lung. Areas of traction bronchiectasis, interstitial reticulation and subpleural honeycombing are noted. Upper abdomen: No acute findings noted. The visualized portions of the liver, spleen, and adrenal glands are unremarkable. Musculoskeletal: No chest wall mass or suspicious bone lesions identified. Multi level spondylosis is identified within the thoracic spine. There is no aggressive lytic or sclerotic bone lesion identified. Chronic appearing wedge deformity involving the lower thoracic vertebra appears unchanged from previous study. IMPRESSION: 1. Interval development of large left pleural effusion. 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 2. Left upper lobe lung mass is identified and worrisome for tumor  recurrence. Consider further evaluation with PET-CT is advised. 3. Aortic atherosclerosis and coronary artery calcifications. Electronically Signed   By: TKerby MoorsM.D.   On: 02/19/2016 17:48     Discharge Medications:   Medication List    STOP taking these medications   oxyCODONE 5 MG immediate release tablet Commonly known as:  Oxy IR/ROXICODONE     TAKE these medications   acetaminophen 500 MG tablet Commonly known as:  TYLENOL Take 1 tablet (500 mg total) by mouth every 6 (six) hours as needed for mild pain. What changed:  how much to take   alendronate 70 MG tablet Commonly known as:  FOSAMAX Take 70 mg by mouth every 14 (fourteen) days. Last dose 02/13/16   amoxicillin-clavulanate 875-125 MG tablet Commonly known as:  AUGMENTIN Take 1 tablet by mouth every 12 (twelve) hours. For 10 days then stop.   aspirin EC 81 MG tablet Take 81 mg by mouth at bedtime.   doxycycline 100 MG tablet Commonly known as:  VIBRA-TABS Take 1 tablet (100 mg total) by mouth every 12 (twelve) hours. For 10 days then stop.   ferrous [id number]-[id number]-folic acid capsule Commonly known as:  TRINSICON / FOLTRIN Take 1 capsule by mouth daily. For one month then stop.   guaiFENesin 600 MG 12 hr tablet Commonly known as:  MUCINEX Take 1 tablet (600 mg total) by mouth 2 (two) times daily as needed for cough or  to loosen phlegm.   lisinopril 10 MG tablet Commonly known as:  PRINIVIL,ZESTRIL Take 2 tablets (20 mg total) by mouth at bedtime. What changed:  medication strength   loratadine 10 MG tablet Commonly known as:  CLARITIN Take 10 mg by mouth daily.   traMADol 50 MG tablet Commonly known as:  ULTRAM Take 1 tablet (50 mg total) by mouth every 6 (six) hours as needed for moderate pain.       Follow Up Appointments: Follow-up Information    [facility] .   Why:  Resume HHRN for pleurx drain. Please drain left Pleur X and record output every Monday,  Wednesday, and Friday Contact information: [place] [address] [phone]        Melrose Nakayama, MD Follow up on 03/15/2016.   Specialty:  Cardiothoracic Surgery Why:  PA/LAT CXR to be taken (at [facility] which is in the same building as Dr. Leonarda Salon office) on 03/15/2016 at 1:45 pm;Appointment time is at 2:15 pm Contact information: [place] [address] [phone]           Signed: Lars Pinks MPA-C 03/10/2016, 9:19 AM

## 2016-03-10 NOTE — Progress Notes (Signed)
      [address]       [place] [address]             [phone]           Subjective: Patient without complaints. Looking forward to going home.  Objective: Vital signs in last 24 hours: Temp:  [98.5 F (36.9 C)-98.8 F (37.1 C)] 98.7 F (37.1 C) (08/03 0344) Pulse Rate:  [69-78] 69 (08/03 0717) Cardiac Rhythm: Normal sinus rhythm (08/03 0717) Resp:  [18-26] 23 (08/03 0717) BP: (99-127)/(57-79) 122/66 (08/03 0717) SpO2:  [100 %] 100 % (08/03 0717)  Intake/Output from previous day: 08/02 0701 - 08/03 0700 In: 2330 [P.O.:1080; I.V.:1200; IV Piggyback:50] Out: 850 [Urine:775; Chest Tube:75]   Physical Exam:  Cardiovascular: RRR Pulmonary: Diminished left base Pleur X: dressing intact  Lab Results: CBC: Recent Labs  03/08/16 0448  WBC 9.0  HGB 8.0*  HCT 24.6*  PLT 237   BMET:  Recent Labs  03/08/16 0448  NA 138  K 4.2  CL 107  CO2 24  GLUCOSE 105*  BUN 26*  CREATININE 1.33*  CALCIUM 8.3*    PT/INR: No results for input(s): LABPROT, INR in the last 72 hours. ABG:  INR: Will add last result for INR, ABG once components are confirmed Will add last 4 CBG results once components are confirmed  Assessment/Plan:  1. CV - SR in the 60-70's. On isinopril 10 mg daily. 2.  Pulmonary - On room air. CXR this am shows stable left pleural effusion and RUL PNA.  3.ID-Continue Augmentin and Doxycycline 4. Discharge later today   Doniesha Landau MPA-C 03/10/2016,8:34 AM

## 2016-03-10 NOTE — Discharge Instructions (Signed)
Please take medication as prescribed

## 2016-03-11 ENCOUNTER — Other Ambulatory Visit: Payer: Self-pay | Admitting: *Deleted

## 2016-03-11 DIAGNOSIS — J90 Pleural effusion, not elsewhere classified: Secondary | ICD-10-CM

## 2016-03-11 LAB — CULTURE, BLOOD (ROUTINE X 2)
CULTURE: NO GROWTH
Culture: NO GROWTH

## 2016-03-11 LAB — BODY FLUID CULTURE: Culture: NO GROWTH

## 2016-03-14 ENCOUNTER — Encounter: Payer: Self-pay | Admitting: *Deleted

## 2016-03-15 ENCOUNTER — Ambulatory Visit (INDEPENDENT_AMBULATORY_CARE_PROVIDER_SITE_OTHER): Payer: Medicare Other | Admitting: Thoracic Surgery (Cardiothoracic Vascular Surgery)

## 2016-03-15 ENCOUNTER — Encounter: Payer: Self-pay | Admitting: Thoracic Surgery (Cardiothoracic Vascular Surgery)

## 2016-03-15 ENCOUNTER — Ambulatory Visit
Admission: RE | Admit: 2016-03-15 | Discharge: 2016-03-15 | Disposition: A | Discharge status: Home / Self Care | Payer: Medicare Other | Source: Ambulatory Visit | Attending: Thoracic Surgery (Cardiothoracic Vascular Surgery) | Admitting: Thoracic Surgery (Cardiothoracic Vascular Surgery)

## 2016-03-15 VITALS — BP 110/62 | HR 81 | Resp 16 | Ht 70.0 in | Wt 181.0 lb

## 2016-03-15 DIAGNOSIS — J90 Pleural effusion, not elsewhere classified: Secondary | ICD-10-CM

## 2016-03-15 DIAGNOSIS — Z789 Other specified health status: Secondary | ICD-10-CM

## 2016-03-15 DIAGNOSIS — J910 Malignant pleural effusion: Secondary | ICD-10-CM

## 2016-03-15 DIAGNOSIS — J948 Other specified pleural conditions: Secondary | ICD-10-CM | POA: Diagnosis not present

## 2016-03-15 DIAGNOSIS — Z9689 Presence of other specified functional implants: Secondary | ICD-10-CM

## 2016-03-15 NOTE — Progress Notes (Signed)
[address]       [place] [address]             [phone]     HPI: Mr. Hollister returns today for follow-up regarding his lower catheter.  He is an 82 year old man with history of small cell lung cancer treated with chemotherapy and radiation. Recently he developed a rapidly recurring pleural effusion. I placed a left pleural catheter on 03/03/2016. I drained about a liter of fluid when the catheter was placed. He was sent home on daily drainages. He presented back on 03/06/2016 with fever and general malaise. He was diagnosed with pneumonia and treated with antibiotics. He was not draining much from the catheter during his admission. Fluoroscopy confirmed the position of the catheter and the pleural space.  He was sent home on 3 times weekly drainage. He's been draining 100 mL or less each time. His breathing has improved since prior to catheter placement. He last drain the catheter yesterday and got about 75 mL's.  Past Medical History:  Diagnosis Date  . Arthritis   . Essential hypertension   . Hypertension   . Lung cancer ([place])   . S/P chemotherapy, time since 4-12 weeks   . S/P radiation therapy   . Shortness of breath dyspnea   . Wears glasses       Current Outpatient Prescriptions  Medication Sig Dispense Refill  . acetaminophen (TYLENOL) 500 MG tablet Take 1 tablet (500 mg total) by mouth every 6 (six) hours as needed for mild pain. (Patient taking differently: Take 1,000 mg by mouth every 6 (six) hours as needed for mild pain. ) 30 tablet 0  . alendronate (FOSAMAX) 70 MG tablet Take 70 mg by mouth every 14 (fourteen) days. Last dose 02/13/16    . amoxicillin-clavulanate (AUGMENTIN) 875-125 MG tablet Take 1 tablet by mouth every 12 (twelve) hours. For 10 days then stop. 20 tablet 0  . aspirin EC 81 MG tablet Take 81 mg by mouth at bedtime.    [name] doxycycline (VIBRA-TABS) 100 MG tablet Take 1 tablet (100 mg total) by mouth every 12 (twelve) hours. For  10 days then stop. 20 tablet 0  . ferrous [id number] C-folic acid (TRINSICON / FOLTRIN) capsule Take 1 capsule by mouth daily. For one month then stop. 30 capsule 0  . guaiFENesin (MUCINEX) 600 MG 12 hr tablet Take 1 tablet (600 mg total) by mouth 2 (two) times daily as needed for cough or to loosen phlegm.    [name] lisinopril (PRINIVIL,ZESTRIL) 10 MG tablet Take 1 tablet (10 mg total) by mouth at bedtime. 30 tablet 1  . loratadine (CLARITIN) 10 MG tablet Take 10 mg by mouth daily.    . traMADol (ULTRAM) 50 MG tablet Take 1 tablet (50 mg total) by mouth every 6 (six) hours as needed for moderate pain. 30 tablet 0   No current facility-administered medications for this visit.     Physical Exam BP 110/62   Pulse 81   Resp 16   Ht '5\' 10"'$  (1.778 m)   Wt 181 lb (82.1 kg)   SpO2 97% Comment: ON RA  BMI 25.61 kg/m  82 year old man in no acute distress Alert and oriented 3 with no focal neuro deficits Cardiac regular rate and rhythm Lungs with diminished breath sounds left base Pleural catheter in place, minimal erythema at the exit site  Diagnostic Tests: CHEST  2 VIEW  COMPARISON:  Five days prior  FINDINGS: Tunneled pleural  catheter on the left is in stable position. Stable volume of left pleural effusion and pleural thickening that is overall small volume. Spiculated left suprahilar mass. Partial clearing of right upper lobe airspace disease. Stable heart size and mediastinal contours. No pneumothorax. Remote lumbar compression fracture.  IMPRESSION: 1. Stable positioning of left tunneled pleural catheter. 2. Stable malignant left pleural fluid and thickening. 3. Clearing right upper lobe airspace disease.   Electronically Signed   By: Monte Fantasia M.D.   On: 03/15/2016 13:55  I personally reviewed the chest x-ray and concur with the findings as noted above.  Impression: 82 year old man with a history of small cell lung cancer who has a left pleural  effusion. A pleural catheter was placed about 2 weeks ago. He was admitted and treated for pneumonia a few days later. He remains on ciprofloxacin for that.  His catheter has minimal drainage. His chest x-ray today does show some opacity at the left base. There may be some fluid that is walled off from the catheter or some this may be pleural tumor implants.  For now we'll continue draining the catheter 3 times weekly. I'll see him back in 2 weeks with a repeat PA and lateral chest x-ray.    Melrose Nakayama, MD [facility] [phone]

## 2016-03-29 ENCOUNTER — Encounter: Payer: Medicare Other | Admitting: Thoracic Surgery (Cardiothoracic Vascular Surgery)

## 2016-03-31 ENCOUNTER — Other Ambulatory Visit: Payer: Self-pay | Admitting: Cardiothoracic Surgery

## 2016-03-31 ENCOUNTER — Ambulatory Visit (INDEPENDENT_AMBULATORY_CARE_PROVIDER_SITE_OTHER): Payer: Medicare Other | Admitting: Thoracic Surgery (Cardiothoracic Vascular Surgery)

## 2016-03-31 ENCOUNTER — Ambulatory Visit
Admission: RE | Admit: 2016-03-31 | Discharge: 2016-03-31 | Disposition: A | Discharge status: Home / Self Care | Payer: Medicare Other | Source: Ambulatory Visit | Attending: Thoracic Surgery (Cardiothoracic Vascular Surgery) | Admitting: Thoracic Surgery (Cardiothoracic Vascular Surgery)

## 2016-03-31 ENCOUNTER — Encounter: Payer: Self-pay | Admitting: Thoracic Surgery (Cardiothoracic Vascular Surgery)

## 2016-03-31 ENCOUNTER — Other Ambulatory Visit: Payer: Self-pay | Admitting: Thoracic Surgery (Cardiothoracic Vascular Surgery)

## 2016-03-31 VITALS — BP 126/77 | HR 85 | Resp 18 | Ht 70.0 in | Wt 181.0 lb

## 2016-03-31 DIAGNOSIS — J910 Malignant pleural effusion: Secondary | ICD-10-CM | POA: Diagnosis not present

## 2016-03-31 DIAGNOSIS — Z789 Other specified health status: Secondary | ICD-10-CM

## 2016-03-31 DIAGNOSIS — C3490 Malignant neoplasm of unspecified part of unspecified bronchus or lung: Secondary | ICD-10-CM

## 2016-03-31 DIAGNOSIS — J948 Other specified pleural conditions: Secondary | ICD-10-CM | POA: Diagnosis not present

## 2016-03-31 DIAGNOSIS — J90 Pleural effusion, not elsewhere classified: Secondary | ICD-10-CM

## 2016-03-31 DIAGNOSIS — Z9689 Presence of other specified functional implants: Secondary | ICD-10-CM

## 2016-03-31 NOTE — Progress Notes (Signed)
[address]       [place] [address]             [phone]       HPI: Mr. Patchin returns today for scheduled follow-up visit regarding his left pleural catheter  He is an 82 year old man with a history of small cell lung cancer treated with chemotherapy and radiation. Earlier this summer he developed a rapidly recurring pleural effusion. He had about 2 L of fluid drained in mid July. I placed a pleural catheter on July 27. There was about a liter of fluid in the chest when the catheter was placed.  He returned 3 days later with fever and general malaise. He was diagnosed with pneumonia and treated with antibiotics (Augmentin and doxycycline). While in the hospital he was not draining much from the catheter. Fluoroscopy confirmed position of the catheter within the pleural space.  I saw him in the office on August 8. At that time he was doing well. He was draining 3 times a week and has been draining less than 100 mL each time. His chest x-ray did show some loculated fluid laterally. We decided to continue with the draining 3 times a week. The last 3 times he has drained the catheter there has been less than 10 mL according to the home health nurse. Today he was drained to the first time in 4 days and that was about 200 ml.  Overall his breathing is much better than it was back in early July before the effusion was drained. He says he does still a little short of breath when the humidity is high. He also says he sometimes feels short of breath for a few minutes in the evening.   Past Medical History:  Diagnosis Date  . Arthritis   . Essential hypertension   . Hypertension   . Lung cancer ([place])   . S/P chemotherapy, time since 4-12 weeks   . S/P radiation therapy   . Shortness of breath dyspnea   . Wears glasses      Current Outpatient Prescriptions  Medication Sig Dispense Refill  . acetaminophen (TYLENOL) 500 MG tablet Take 1 tablet (500 mg total) by  mouth every 6 (six) hours as needed for mild pain. (Patient taking differently: Take 1,000 mg by mouth every 6 (six) hours as needed for mild pain. ) 30 tablet 0  . alendronate (FOSAMAX) 70 MG tablet Take 70 mg by mouth every 14 (fourteen) days. Last dose 02/13/16    . aspirin EC 81 MG tablet Take 81 mg by mouth at bedtime.    . ferrous [id number] C-folic acid (TRINSICON / FOLTRIN) capsule Take 1 capsule by mouth daily. For one month then stop. 30 capsule 0  . guaiFENesin (MUCINEX) 600 MG 12 hr tablet Take 1 tablet (600 mg total) by mouth 2 (two) times daily as needed for cough or to loosen phlegm.    [name] lisinopril (PRINIVIL,ZESTRIL) 10 MG tablet Take 1 tablet (10 mg total) by mouth at bedtime. 30 tablet 1  . loratadine (CLARITIN) 10 MG tablet Take 10 mg by mouth daily.    . traMADol (ULTRAM) 50 MG tablet Take 1 tablet (50 mg total) by mouth every 6 (six) hours as needed for moderate pain. 30 tablet 0   No current facility-administered medications for this visit.     Physical Exam BP 126/77   Pulse 85   Resp 18   Ht '5\' 10"'$  (1.778 m)  Wt 181 lb (82.1 kg)   SpO2 97% Comment: ON RA  BMI 25.23 kg/m  82 year old man in no acute distress Alert and oriented 3 with no focal deficits Cardiac regular rate and rhythm normal S1 and S2 Lungs essentially equal breath sounds bilaterally Pleural catheter in place, sites clear  Diagnostic Tests: CHEST  2 VIEW  COMPARISON:  Chest x-ray of 03/15/2016 and CT chest of 02/19/2016  FINDINGS: There is little change in the left pleural effusion which may be loculated posteriorly with possible left pleural thickening and volume loss at the left base. Abnormal opacity in the left medial upper lung field is unchanged as well. This area was noted on CT from 02/19/2016 and may represent either recurrence of carcinoma or second primary. The right lung is clear. Heart size is stable. There are degenerative changes in the mid to lower thoracic  spine.  IMPRESSION: 1. Persistent left basilar opacity most consistent with loculated pleural effusion and pleural thickening with volume loss. 2. No change in the medial left upper lobe opacity adjacent to the aortic knob. This is suspicious for either recurrence of carcinoma or a second primary lesion.   Electronically Signed   By: Ivar Drape M.D.   On: 03/31/2016 15:13  I personally reviewed the chest x-ray and concur with the findings noted above.  Impression: 82 year old man with metastatic small cell cancer with malignant left pleural effusion who has a pleural catheter in place. He still has some loculated fluid in the left chest visible on chest x-ray. Despite minimal drainage from the catheter this opacity has not changed over the past few weeks. I suspect that there is just some loculated fluid does not can be accessible to the catheter. As he is minimally symptomatic I don't think any further intervention is warranted to try to drain that.  At this point we'll go to once a week drainage.  Pneumonia- resolved with antibiotics  Plan: Return in 3 weeks with PA and lateral chest x-ray  Melrose Nakayama, MD [facility] [phone]

## 2016-04-06 ENCOUNTER — Ambulatory Visit (HOSPITAL_BASED_OUTPATIENT_CLINIC_OR_DEPARTMENT_OTHER): Payer: Medicare Other | Admitting: Internal Medicine

## 2016-04-06 ENCOUNTER — Other Ambulatory Visit (HOSPITAL_BASED_OUTPATIENT_CLINIC_OR_DEPARTMENT_OTHER): Payer: Medicare Other

## 2016-04-06 ENCOUNTER — Encounter: Payer: Self-pay | Admitting: Internal Medicine

## 2016-04-06 VITALS — BP 136/72 | HR 95 | Temp 98.4°F | Resp 18 | Ht 70.0 in | Wt 176.6 lb

## 2016-04-06 DIAGNOSIS — J90 Pleural effusion, not elsewhere classified: Secondary | ICD-10-CM | POA: Diagnosis not present

## 2016-04-06 DIAGNOSIS — E875 Hyperkalemia: Secondary | ICD-10-CM

## 2016-04-06 DIAGNOSIS — C3490 Malignant neoplasm of unspecified part of unspecified bronchus or lung: Secondary | ICD-10-CM

## 2016-04-06 DIAGNOSIS — C3412 Malignant neoplasm of upper lobe, left bronchus or lung: Secondary | ICD-10-CM

## 2016-04-06 DIAGNOSIS — T451X5A Adverse effect of antineoplastic and immunosuppressive drugs, initial encounter: Secondary | ICD-10-CM

## 2016-04-06 DIAGNOSIS — D6481 Anemia due to antineoplastic chemotherapy: Secondary | ICD-10-CM | POA: Diagnosis not present

## 2016-04-06 LAB — COMPREHENSIVE METABOLIC PANEL
ALT: 33 U/L (ref 0–55)
ANION GAP: 10 meq/L (ref 3–11)
AST: 28 U/L (ref 5–34)
Albumin: 3 g/dL — ABNORMAL LOW (ref 3.5–5.0)
Alkaline Phosphatase: 113 U/L (ref 40–150)
BILIRUBIN TOTAL: 0.38 mg/dL (ref 0.20–1.20)
BUN: 21 mg/dL (ref 7.0–26.0)
CALCIUM: 10.1 mg/dL (ref 8.4–10.4)
CO2: 24 meq/L (ref 22–29)
CREATININE: 1.2 mg/dL (ref 0.7–1.3)
Chloride: 105 mEq/L (ref 98–109)
EGFR: 58 mL/min/{1.73_m2} — ABNORMAL LOW (ref >90)
Glucose: 141 mg/dl — ABNORMAL HIGH (ref 70–140)
Potassium: 4.5 mEq/L (ref 3.5–5.1)
Sodium: 139 mEq/L (ref 136–145)
TOTAL PROTEIN: 7.5 g/dL (ref 6.4–8.3)

## 2016-04-06 LAB — CBC WITH DIFFERENTIAL/PLATELET
BASO%: 0.6 % (ref 0.0–2.0)
Basophils Absolute: 0.1 10*3/uL (ref 0.0–0.1)
EOS%: 4.2 % (ref 0.0–7.0)
Eosinophils Absolute: 0.4 10*3/uL (ref 0.0–0.5)
HEMATOCRIT: 31.1 % — AB (ref 38.4–49.9)
HGB: 10.1 g/dL — ABNORMAL LOW (ref 13.0–17.1)
LYMPH#: 0.7 10*3/uL — AB (ref 0.9–3.3)
LYMPH%: 8 % — ABNORMAL LOW (ref 14.0–49.0)
MCH: 30.2 pg (ref 27.2–33.4)
MCHC: 32.6 g/dL (ref 32.0–36.0)
MCV: 92.8 fL (ref 79.3–98.0)
MONO#: 0.7 10*3/uL (ref 0.1–0.9)
MONO%: 7.9 % (ref 0.0–14.0)
NEUT%: 79.3 % — ABNORMAL HIGH (ref 39.0–75.0)
NEUTROS ABS: 7.4 10*3/uL — AB (ref 1.5–6.5)
PLATELETS: 280 10*3/uL (ref 140–400)
RBC: 3.35 10*6/uL — AB (ref 4.20–5.82)
RDW: 14.2 % (ref 11.0–14.6)
WBC: 9.3 10*3/uL (ref 4.0–10.3)

## 2016-04-06 NOTE — Progress Notes (Signed)
Travis Medina [phone]   [phone]  OFFICE PROGRESS NOTE  [name], MD [place] [address]  DIAGNOSIS: Limited stage (T3, N2, M0) small cell lung cancer diagnosed in February 2016 presenting with 2 pulmonary nodules in the left upper lobe as well as questionable mediastinal lymphadenopathy.  PRIOR THERAPY:  1) Systemic chemotherapy with carboplatin for AUC of 5 on day 1 and etoposide 120 MG/M2 on days 1, 2 and 3 with Neulasta support on day 4. First dose on 12/02/2014. He is status post 4 cycles. This is concurrent with radiotherapy under the care of Dr. Tammi Klippel completed on 01/15/2015 and last dose of chemotherapy was on 02/03/2015. 2) whole brain irradiation under the care of Dr. Tammi Klippel completed on 07/14/2015.  CURRENT THERAPY: Observation.  INTERVAL HISTORY: Travis Medina 82 y.o. male returns to the clinic today for follow-up visit accompanied by his wife. The patient is feeling fine today with no specific complaints except for mild fatigue. He was admitted last month to [facility] with pneumonia and large left pleural effusion. He underwent thoracentesis followed by Pleurx catheter placement. His drainage is decreasing and currently once a week. Last drainage was around 100 mL. Cytology of the pleural fluid was negative for malignancy. He is followed by Dr. Roxan Hockey and Dr. Prescott Gum. He denied having any significant chest pain, shortness of breath, cough or hemoptysis. The patient denied having any significant nausea or vomiting, no fever or chills. He has no significant weight loss or night sweats. He had repeat blood work and he is here for evaluation and discussion of his lab results.  MEDICAL HISTORY: Past Medical History:  Diagnosis Date  . Arthritis   . Essential hypertension   . Hypertension   . Lung cancer ([place])   . S/P chemotherapy, time since 4-12 weeks   . S/P radiation therapy   . Shortness  of breath dyspnea   . Wears glasses     ALLERGIES:  is allergic to morphine and related.  MEDICATIONS:  Current Outpatient Prescriptions  Medication Sig Dispense Refill  . acetaminophen (TYLENOL) 500 MG tablet Take 1 tablet (500 mg total) by mouth every 6 (six) hours as needed for mild pain. (Patient taking differently: Take 1,000 mg by mouth every 6 (six) hours as needed for mild pain. ) 30 tablet 0  . alendronate (FOSAMAX) 70 MG tablet Take 70 mg by mouth every 14 (fourteen) days. Last dose 02/13/16    . aspirin EC 81 MG tablet Take 81 mg by mouth at bedtime.    . ferrous [id number] C-folic acid (TRINSICON / FOLTRIN) capsule Take 1 capsule by mouth daily. For one month then stop. 30 capsule 0  . guaiFENesin (MUCINEX) 600 MG 12 hr tablet Take 1 tablet (600 mg total) by mouth 2 (two) times daily as needed for cough or to loosen phlegm.    [name] lisinopril (PRINIVIL,ZESTRIL) 10 MG tablet Take 1 tablet (10 mg total) by mouth at bedtime. 30 tablet 1  . loratadine (CLARITIN) 10 MG tablet Take 10 mg by mouth daily.    . traMADol (ULTRAM) 50 MG tablet Take 1 tablet (50 mg total) by mouth every 6 (six) hours as needed for moderate pain. 30 tablet 0   No current facility-administered medications for this visit.     SURGICAL HISTORY:  Past Surgical History:  Procedure Laterality Date  . CHEST TUBE INSERTION Left 03/03/2016   Procedure: INSERTION PLEURAL DRAINAGE CATHETER;  Surgeon: Melrose Nakayama, MD;  Location: [facility];  Service: Thoracic;  Laterality: Left;  . COLONOSCOPY    . LUNG BIOPSY    . TOTAL KNEE ARTHROPLASTY     bilateral    REVIEW OF SYSTEMS:  A comprehensive review of systems was negative except for: Constitutional: positive for fatigue Respiratory: positive for cough and dyspnea on exertion   PHYSICAL EXAMINATION: General appearance: alert, cooperative and no distress Head: Normocephalic, without obvious abnormality, atraumatic Neck: no adenopathy, no JVD, supple,  symmetrical, trachea midline and thyroid not enlarged, symmetric, no tenderness/mass/nodules Lymph nodes: Cervical, supraclavicular, and axillary nodes normal. Resp: clear to auscultation bilaterally Back: symmetric, no curvature. ROM normal. No CVA tenderness. Cardio: regular rate and rhythm, S1, S2 normal, no murmur, click, rub or gallop GI: soft, non-tender; bowel sounds normal; no masses,  no organomegaly Extremities: extremities normal, atraumatic, no cyanosis or edema Neurologic: Alert and oriented X 3, normal strength and tone. Normal symmetric reflexes. Normal coordination and gait  ECOG PERFORMANCE STATUS: 1 - Symptomatic but completely ambulatory  Blood pressure 136/72, pulse 95, temperature 98.4 F (36.9 C), temperature source Oral, resp. rate 18, height '5\' 10"'$  (1.778 m), weight 176 lb 9.6 oz (80.1 kg), SpO2 95 %.  LABORATORY DATA: Lab Results  Component Value Date   WBC 9.3 04/06/2016   HGB 10.1 (L) 04/06/2016   HCT 31.1 (L) 04/06/2016   MCV 92.8 04/06/2016   PLT 280 04/06/2016      Chemistry      Component Value Date/Time   NA 139 04/06/2016 0954   K 4.5 04/06/2016 0954   CL 107 03/08/2016 0448   CO2 24 04/06/2016 0954   BUN 21.0 04/06/2016 0954   CREATININE 1.2 04/06/2016 0954      Component Value Date/Time   CALCIUM 10.1 04/06/2016 0954   ALKPHOS 113 04/06/2016 0954   AST 28 04/06/2016 0954   ALT 33 04/06/2016 0954   BILITOT 0.38 04/06/2016 0954       RADIOGRAPHIC STUDIES: Dg Chest 2 View  Result Date: 03/31/2016 CLINICAL DATA:  History of left lung carcinoma, followup EXAM: CHEST  2 VIEW COMPARISON:  Chest x-ray of 03/15/2016 and CT chest of 02/19/2016 FINDINGS: There is little change in the left pleural effusion which may be loculated posteriorly with possible left pleural thickening and volume loss at the left base. Abnormal opacity in the left medial upper lung field is unchanged as well. This area was noted on CT from 02/19/2016 and may represent  either recurrence of carcinoma or second primary. The right lung is clear. Heart size is stable. There are degenerative changes in the mid to lower thoracic spine. IMPRESSION: 1. Persistent left basilar opacity most consistent with loculated pleural effusion and pleural thickening with volume loss. 2. No change in the medial left upper lobe opacity adjacent to the aortic knob. This is suspicious for either recurrence of carcinoma or a second primary lesion. Electronically Signed   By: Ivar Drape M.D.   On: 03/31/2016 15:13   Dg Chest 2 View  Result Date: 03/15/2016 CLINICAL DATA:  Follow-up malignant pleural effusion. Productive cough. EXAM: CHEST  2 VIEW COMPARISON:  Five days prior FINDINGS: Tunneled pleural catheter on the left is in stable position. Stable volume of left pleural effusion and pleural thickening that is overall small volume. Spiculated left suprahilar mass. Partial clearing of right upper lobe airspace disease. Stable heart size and mediastinal contours. No pneumothorax. Remote lumbar compression fracture. IMPRESSION: 1. Stable positioning of left  tunneled pleural catheter. 2. Stable malignant left pleural fluid and thickening. 3. Clearing right upper lobe airspace disease. Electronically Signed   By: Monte Fantasia M.D.   On: 03/15/2016 13:55   Dg Chest 2 View  Result Date: 03/10/2016 CLINICAL DATA:  Pneumonia EXAM: CHEST  2 VIEW COMPARISON:  03/08/2016 FINDINGS: Left-sided pleural catheter in stable position. Stable to decrease loculated left effusion and/or pleural thickening. Patchy airspace opacity greatest in the right upper lobe. Stable volume loss on the left with spiculated left paramediastinal nodule. Remote L1 compression fracture. Normal heart size. IMPRESSION: 1. Stable positioning of left pleural catheter and residual small pleural effusion. 2. Stable right upper lobe pneumonia. Electronically Signed   By: Monte Fantasia M.D.   On: 03/10/2016 08:20   Dg Chest 2  View  Result Date: 03/08/2016 CLINICAL DATA:  Shortness of breath, history of lung carcinoma, former smoking history portable chest x-ray of 03/07/2016 EXAM: CHEST  2 VIEW COMPARISON:  And CT chest of 02/19/2016 FINDINGS: There is little change in pleural and parenchymal opacity at the left lung base most consistent with effusion and volume loss. A left pleural catheter is present. Abnormal opacity overlying the aortic knob within the medial left upper lung field also is present and may represent primary or metastatic tumor. Somewhat prominent markings are present in the right upper lobe and pneumonia is a consideration. Heart size is stable. No acute bony abnormality is seen. IMPRESSION: 1. No change in pleural and parenchymal opacity at the left lung base consistent with left effusion and volume loss. Left chest tube remains. 2. No change in left suprahilar opacity worrisome for primary or metastatic disease. 3. Question patchy pneumonia in the right upper lobe. Electronically Signed   By: Ivar Drape M.D.   On: 03/08/2016 08:01    ASSESSMENT AND PLAN: This is a very pleasant 82 years old white male recently diagnosed with limited stage small cell lung cancer presented with a 2 left upper lobe pulmonary nodules as well as questionable mediastinal lymphadenopathy.  He underwent systemic chemotherapy with carboplatin and etoposide status post 4 cycles concurrent with radiation was almost complete response. This was followed by whole brain radiation The patient is currently on observation and doing fine except for persistent mild fatigue and shortness of breath after the recent hospital admission for pneumonia and left pleural effusion. He is feeling a little bit better today and gaining more strength. Blood work is unremarkable except for mild anemia which significantly improved compared to 4 weeks ago. For the anemia, I advised the patient to start taking over-the-counter oral iron tablets 1-2 tablets every  day. For the hyperkalemia, I advised the patient to cut down on the potassium-rich food. I recommended for him to continue on observation with repeat CT scan of the chest in November 2017 as previously scheduled. He will come back for follow-up visit at that time. He was advised to call immediately if he has any concerning symptoms in the interval. The patient voices understanding of current disease status and treatment options and is in agreement with the current care plan.  All questions were answered. The patient knows to call the clinic with any problems, questions or concerns. We can certainly see the patient much sooner if necessary.  Disclaimer: This note was dictated with voice recognition software. Similar sounding words can inadvertently be transcribed and may not be corrected upon review.

## 2016-04-14 ENCOUNTER — Inpatient Hospital Stay (HOSPITAL_COMMUNITY): Payer: Medicare Other

## 2016-04-14 ENCOUNTER — Encounter (HOSPITAL_COMMUNITY): Payer: Self-pay | Admitting: Emergency Medicine

## 2016-04-14 ENCOUNTER — Inpatient Hospital Stay (HOSPITAL_COMMUNITY)
Admission: EM | Admit: 2016-04-14 | Discharge: 2016-04-17 | DRG: 193 | Disposition: A | Discharge status: Hospice | Payer: Medicare Other | Attending: Internal Medicine | Admitting: Internal Medicine

## 2016-04-14 ENCOUNTER — Other Ambulatory Visit: Payer: Self-pay

## 2016-04-14 ENCOUNTER — Emergency Department (HOSPITAL_COMMUNITY): Payer: Medicare Other

## 2016-04-14 DIAGNOSIS — C3490 Malignant neoplasm of unspecified part of unspecified bronchus or lung: Secondary | ICD-10-CM | POA: Diagnosis not present

## 2016-04-14 DIAGNOSIS — M1990 Unspecified osteoarthritis, unspecified site: Secondary | ICD-10-CM | POA: Diagnosis present

## 2016-04-14 DIAGNOSIS — I129 Hypertensive chronic kidney disease with stage 1 through stage 4 chronic kidney disease, or unspecified chronic kidney disease: Secondary | ICD-10-CM | POA: Diagnosis present

## 2016-04-14 DIAGNOSIS — Z85118 Personal history of other malignant neoplasm of bronchus and lung: Secondary | ICD-10-CM

## 2016-04-14 DIAGNOSIS — Z7189 Other specified counseling: Secondary | ICD-10-CM | POA: Diagnosis not present

## 2016-04-14 DIAGNOSIS — R0600 Dyspnea, unspecified: Secondary | ICD-10-CM

## 2016-04-14 DIAGNOSIS — Z789 Other specified health status: Secondary | ICD-10-CM | POA: Diagnosis not present

## 2016-04-14 DIAGNOSIS — Z96653 Presence of artificial knee joint, bilateral: Secondary | ICD-10-CM | POA: Diagnosis present

## 2016-04-14 DIAGNOSIS — Z66 Do not resuscitate: Secondary | ICD-10-CM | POA: Diagnosis present

## 2016-04-14 DIAGNOSIS — Z7983 Long term (current) use of bisphosphonates: Secondary | ICD-10-CM

## 2016-04-14 DIAGNOSIS — J189 Pneumonia, unspecified organism: Principal | ICD-10-CM | POA: Diagnosis present

## 2016-04-14 DIAGNOSIS — C787 Secondary malignant neoplasm of liver and intrahepatic bile duct: Secondary | ICD-10-CM | POA: Diagnosis present

## 2016-04-14 DIAGNOSIS — J9601 Acute respiratory failure with hypoxia: Secondary | ICD-10-CM | POA: Diagnosis present

## 2016-04-14 DIAGNOSIS — N189 Chronic kidney disease, unspecified: Secondary | ICD-10-CM | POA: Diagnosis present

## 2016-04-14 DIAGNOSIS — F411 Generalized anxiety disorder: Secondary | ICD-10-CM | POA: Diagnosis present

## 2016-04-14 DIAGNOSIS — Z9221 Personal history of antineoplastic chemotherapy: Secondary | ICD-10-CM | POA: Diagnosis not present

## 2016-04-14 DIAGNOSIS — R627 Adult failure to thrive: Secondary | ICD-10-CM | POA: Diagnosis present

## 2016-04-14 DIAGNOSIS — J948 Other specified pleural conditions: Secondary | ICD-10-CM | POA: Diagnosis not present

## 2016-04-14 DIAGNOSIS — Z923 Personal history of irradiation: Secondary | ICD-10-CM | POA: Diagnosis not present

## 2016-04-14 DIAGNOSIS — C7802 Secondary malignant neoplasm of left lung: Secondary | ICD-10-CM | POA: Diagnosis present

## 2016-04-14 DIAGNOSIS — Y95 Nosocomial condition: Secondary | ICD-10-CM | POA: Diagnosis present

## 2016-04-14 DIAGNOSIS — Z515 Encounter for palliative care: Secondary | ICD-10-CM

## 2016-04-14 DIAGNOSIS — J90 Pleural effusion, not elsewhere classified: Secondary | ICD-10-CM | POA: Diagnosis present

## 2016-04-14 DIAGNOSIS — R0602 Shortness of breath: Secondary | ICD-10-CM | POA: Diagnosis present

## 2016-04-14 DIAGNOSIS — Z794 Long term (current) use of insulin: Secondary | ICD-10-CM | POA: Diagnosis not present

## 2016-04-14 DIAGNOSIS — D649 Anemia, unspecified: Secondary | ICD-10-CM | POA: Diagnosis present

## 2016-04-14 DIAGNOSIS — Z87891 Personal history of nicotine dependence: Secondary | ICD-10-CM | POA: Diagnosis not present

## 2016-04-14 DIAGNOSIS — C799 Secondary malignant neoplasm of unspecified site: Secondary | ICD-10-CM | POA: Diagnosis present

## 2016-04-14 DIAGNOSIS — J9691 Respiratory failure, unspecified with hypoxia: Secondary | ICD-10-CM | POA: Diagnosis present

## 2016-04-14 DIAGNOSIS — C801 Malignant (primary) neoplasm, unspecified: Secondary | ICD-10-CM | POA: Diagnosis not present

## 2016-04-14 DIAGNOSIS — J9690 Respiratory failure, unspecified, unspecified whether with hypoxia or hypercapnia: Secondary | ICD-10-CM | POA: Diagnosis not present

## 2016-04-14 LAB — URINALYSIS, ROUTINE W REFLEX MICROSCOPIC
BILIRUBIN URINE: NEGATIVE
Glucose, UA: NEGATIVE mg/dL
Hgb urine dipstick: NEGATIVE
KETONES UR: NEGATIVE mg/dL
Leukocytes, UA: NEGATIVE
NITRITE: NEGATIVE
PH: 5.5 (ref 5.0–8.0)
Protein, ur: NEGATIVE mg/dL
SPECIFIC GRAVITY, URINE: 1.014 (ref 1.005–1.030)

## 2016-04-14 LAB — COMPREHENSIVE METABOLIC PANEL
ALBUMIN: 3 g/dL — AB (ref 3.5–5.0)
ALK PHOS: 124 U/L (ref 38–126)
ALT: 25 U/L (ref 17–63)
AST: 30 U/L (ref 15–41)
Anion gap: 10 (ref 5–15)
BUN: 30 mg/dL — ABNORMAL HIGH (ref 6–20)
CALCIUM: 9.8 mg/dL (ref 8.9–10.3)
CHLORIDE: 103 mmol/L (ref 101–111)
CO2: 24 mmol/L (ref 22–32)
CREATININE: 1.14 mg/dL (ref 0.61–1.24)
GFR calc Af Amer: >60 mL/min (ref >60)
GFR calc non Af Amer: 59 mL/min — ABNORMAL LOW (ref >60)
GLUCOSE: 122 mg/dL — AB (ref 65–99)
Potassium: 4.5 mmol/L (ref 3.5–5.1)
SODIUM: 137 mmol/L (ref 135–145)
Total Bilirubin: 0.3 mg/dL (ref 0.3–1.2)
Total Protein: 7.5 g/dL (ref 6.5–8.1)

## 2016-04-14 LAB — CBC WITH DIFFERENTIAL/PLATELET
BASOS PCT: 0 %
Basophils Absolute: 0 10*3/uL (ref 0.0–0.1)
EOS ABS: 0.3 10*3/uL (ref 0.0–0.7)
Eosinophils Relative: 2 %
HEMATOCRIT: 32.9 % — AB (ref 39.0–52.0)
HEMOGLOBIN: 10.4 g/dL — AB (ref 13.0–17.0)
LYMPHS ABS: 0.9 10*3/uL (ref 0.7–4.0)
Lymphocytes Relative: 8 %
MCH: 29.1 pg (ref 26.0–34.0)
MCHC: 31.6 g/dL (ref 30.0–36.0)
MCV: 92.2 fL (ref 78.0–100.0)
MONO ABS: 0.9 10*3/uL (ref 0.1–1.0)
MONOS PCT: 8 %
NEUTROS ABS: 9.4 10*3/uL — AB (ref 1.7–7.7)
NEUTROS PCT: 82 %
Platelets: 264 10*3/uL (ref 150–400)
RBC: 3.57 MIL/uL — ABNORMAL LOW (ref 4.22–5.81)
RDW: 13.8 % (ref 11.5–15.5)
WBC: 11.6 10*3/uL — ABNORMAL HIGH (ref 4.0–10.5)

## 2016-04-14 LAB — I-STAT CG4 LACTIC ACID, ED
LACTIC ACID, VENOUS: 1.61 mmol/L (ref 0.5–1.9)
Lactic Acid, Venous: 2.55 mmol/L (ref 0.5–1.9)

## 2016-04-14 LAB — BRAIN NATRIURETIC PEPTIDE: B Natriuretic Peptide: 93.7 pg/mL (ref 0.0–100.0)

## 2016-04-14 MED ORDER — DEXTROSE 5 % IV SOLN
1.0000 g | Freq: Two times a day (BID) | INTRAVENOUS | Status: DC
  Administered 2016-04-15 – 2016-04-17 (×5): 1 g via INTRAVENOUS
  Filled 2016-04-14 (×7): qty 1

## 2016-04-14 MED ORDER — LORATADINE 10 MG PO TABS
10.0000 mg | ORAL_TABLET | Freq: Every day | ORAL | Status: DC
  Administered 2016-04-15 – 2016-04-16 (×2): 10 mg via ORAL
  Filled 2016-04-14 (×2): qty 1

## 2016-04-14 MED ORDER — IOPAMIDOL (ISOVUE-370) INJECTION 76%
INTRAVENOUS | Status: AC
  Administered 2016-04-14: 100 mL
  Filled 2016-04-14: qty 100

## 2016-04-14 MED ORDER — ENOXAPARIN SODIUM 40 MG/0.4ML ~~LOC~~ SOLN
40.0000 mg | Freq: Every day | SUBCUTANEOUS | Status: DC
  Administered 2016-04-15 (×2): 40 mg via SUBCUTANEOUS
  Filled 2016-04-14 (×2): qty 0.4

## 2016-04-14 MED ORDER — DEXTROSE 5 % IV SOLN
2.0000 g | Freq: Once | INTRAVENOUS | Status: AC
  Administered 2016-04-14: 2 g via INTRAVENOUS
  Filled 2016-04-14: qty 2

## 2016-04-14 MED ORDER — VANCOMYCIN HCL 10 G IV SOLR
1500.0000 mg | Freq: Once | INTRAVENOUS | Status: AC
  Administered 2016-04-14: 1500 mg via INTRAVENOUS
  Filled 2016-04-14: qty 1500

## 2016-04-14 MED ORDER — ASPIRIN EC 81 MG PO TBEC
81.0000 mg | DELAYED_RELEASE_TABLET | Freq: Every day | ORAL | Status: DC
  Administered 2016-04-15 (×2): 81 mg via ORAL
  Filled 2016-04-14 (×2): qty 1

## 2016-04-14 MED ORDER — GUAIFENESIN ER 600 MG PO TB12
600.0000 mg | ORAL_TABLET | Freq: Two times a day (BID) | ORAL | Status: DC
  Administered 2016-04-15 – 2016-04-16 (×4): 600 mg via ORAL
  Filled 2016-04-14 (×4): qty 1

## 2016-04-14 MED ORDER — ACETAMINOPHEN 500 MG PO TABS
500.0000 mg | ORAL_TABLET | Freq: Four times a day (QID) | ORAL | Status: DC | PRN
  Administered 2016-04-14: 500 mg via ORAL
  Filled 2016-04-14: qty 1

## 2016-04-14 MED ORDER — VANCOMYCIN HCL IN DEXTROSE 750-5 MG/150ML-% IV SOLN
750.0000 mg | Freq: Two times a day (BID) | INTRAVENOUS | Status: DC
  Administered 2016-04-15 – 2016-04-17 (×5): 750 mg via INTRAVENOUS
  Filled 2016-04-14 (×6): qty 150

## 2016-04-14 MED ORDER — SODIUM CHLORIDE 0.9% FLUSH
3.0000 mL | Freq: Two times a day (BID) | INTRAVENOUS | Status: DC
  Administered 2016-04-15 – 2016-04-17 (×6): 3 mL via INTRAVENOUS

## 2016-04-14 NOTE — ED Notes (Signed)
Brought patient back to room via wheelchair with family in tow; patient undressed and getting into a gown at this time; wife at bedside

## 2016-04-14 NOTE — ED Provider Notes (Signed)
Patient seen/examined in the Emergency Department in conjunction with Resident Physician Provider [facility] Patient reports increased SOB Exam : awake/alert, crackles in right lung field Plan: admit for pneumonia Pt stable, not septic appearing at this time     Ripley Fraise, MD 04/14/16 1603

## 2016-04-14 NOTE — ED Provider Notes (Signed)
[facility] DEPT Provider Note   CSN: [id number] Arrival date & time: 04/14/16  1159     History   Chief Complaint Chief Complaint  Patient presents with  . Shortness of Breath    HPI Travis Medina is a 82 y.o. male.  The history is provided by the patient, the spouse and medical records.   81 year old male with history of small cell lung cancer status post chemotherapy and radiation completed a year ago, hypertension, arthritis presenting with shortness of breath. Onset was yesterday. Worsening since then. Moderate to severe. Worse with ambulation, and patient reports he is unable to cross a room without becoming severely short of breath. Alleviated by resting. Associated with productive cough and subjective fevers. No leg swelling or hemoptysis, diaphoresis, nausea/vomiting. Of note, patient was seen in July for a malignant pleural effusion in the left lung, and has an indwelling pleural catheter placed by CT surgery. He reports that today, the drainage from this tube has decreased only 5 mL, with a slight blood tinge to it that is new. Normally straw-colored. He has mild pain around the chest tube site but otherwise denies chest pain.    Past Medical History:  Diagnosis Date  . Arthritis   . Essential hypertension   . Hypertension   . Lung cancer ([place])   . S/P chemotherapy, time since 4-12 weeks   . S/P radiation therapy   . Shortness of breath dyspnea   . Wears glasses     Patient Active Problem List   Diagnosis Date Noted  . Pneumonia 03/06/2016  . Pleural effusion 02/19/2016  . Hyponatremia 02/19/2016  . CKD (chronic kidney disease) 02/19/2016  . Elevated bilirubin 01/02/2015  . Antineoplastic chemotherapy induced anemia 01/02/2015  . Primary cancer of left upper lobe of lung ([place]) 11/20/2014  . Lung mass 11/14/2014  . Essential hypertension 10/23/2014  . Pathologic fracture of femur ([place]) 10/23/2014    Past Surgical History:  Procedure Laterality Date  .  CHEST TUBE INSERTION Left 03/03/2016   Procedure: INSERTION PLEURAL DRAINAGE CATHETER;  Surgeon: Melrose Nakayama, MD;  Location: [facility];  Service: Thoracic;  Laterality: Left;  . COLONOSCOPY    . LUNG BIOPSY    . TOTAL KNEE ARTHROPLASTY     bilateral       Home Medications    Prior to Admission medications   Medication Sig Start Date End Date Taking? Authorizing Provider  acetaminophen (TYLENOL) 500 MG tablet Take 1 tablet (500 mg total) by mouth every 6 (six) hours as needed for mild pain. Patient taking differently: Take 1,000 mg by mouth every 6 (six) hours as needed for mild pain.  10/25/14  Yes Florencia Reasons, MD  aspirin EC 81 MG tablet Take 81 mg by mouth at bedtime.   Yes Historical Provider, MD  loratadine (CLARITIN) 10 MG tablet Take 10 mg by mouth daily.   Yes Historical Provider, MD  alendronate (FOSAMAX) 70 MG tablet Take 70 mg by mouth every 14 (fourteen) days. Last dose 02/13/16 06/09/15   Historical Provider, MD  guaiFENesin (MUCINEX) 600 MG 12 hr tablet Take 1 tablet (600 mg total) by mouth 2 (two) times daily as needed for cough or to loosen phlegm. Patient not taking: Reported on 04/06/2016 03/10/16   Nani Skillern, PA-C  lisinopril (PRINIVIL,ZESTRIL) 10 MG tablet Take 1 tablet (10 mg total) by mouth at bedtime. Patient not taking: Reported on 04/06/2016 03/10/16   Nani Skillern, PA-C  traMADol (ULTRAM) 50 MG tablet Take  1 tablet (50 mg total) by mouth every 6 (six) hours as needed for moderate pain. Patient not taking: Reported on 04/06/2016 03/10/16   Nani Skillern, PA-C    Family History Family History  Problem Relation Age of Onset  . Heart failure Mother   . Colon cancer Sister     Social History Social History  Substance Use Topics  . Smoking status: Former Smoker    Packs/day: 1.00    Years: 25.00    Types: Cigarettes    Quit date: 08/09/1975  . Smokeless tobacco: Never Used  . Alcohol use No     Allergies   Morphine and related   Review  of Systems Review of Systems  Constitutional: Positive for fever.  HENT: Negative for rhinorrhea.   Respiratory: Positive for cough and shortness of breath.   Cardiovascular: Negative for chest pain and leg swelling.  Gastrointestinal: Negative for abdominal pain, diarrhea, nausea and vomiting.  Genitourinary: Negative for decreased urine volume and dysuria.  Skin: Negative for rash.  Allergic/Immunologic: Negative for immunocompromised state.  Neurological: Negative for syncope.  Hematological: Does not bruise/bleed easily.  All other systems reviewed and are negative.   Physical Exam Updated Vital Signs BP 121/61   Pulse 86   Temp 98.1 F (36.7 C) (Oral)   Resp (!) 28   Ht '5\' 10"'$  (1.778 m)   Wt 78 kg   SpO2 95%   BMI 24.68 kg/m   Physical Exam  Constitutional: He appears well-developed and well-nourished.  HENT:  Head: Normocephalic and atraumatic.  Eyes: Conjunctivae are normal.  Neck: Neck supple.  Cardiovascular: Normal rate and regular rhythm.   Pulmonary/Chest: He is in respiratory distress (mildly increased WOB with tachypnea ). He has rales (crackles and rhonchi in right mid and lower lung fields). He exhibits tenderness (mild TTP surrounding left pleural cath site, which is c/d/i with no drainage at skin, with straw colored fluid with slight blood tinge in tubing ).  Abdominal: Soft. There is no tenderness.  Musculoskeletal: He exhibits no edema.  Neurological: He is alert.  Skin: Skin is warm and dry. He is not diaphoretic.  Psychiatric: He has a normal mood and affect.  Nursing note and vitals reviewed.   ED Treatments / Results  Labs (all labs ordered are listed, but only abnormal results are displayed) Labs Reviewed  COMPREHENSIVE METABOLIC PANEL - Abnormal; Notable for the following:       Result Value   Glucose, Bld 122 (*)    BUN 30 (*)    Albumin 3.0 (*)    GFR calc non Af Amer 59 (*)    All other components within normal limits  CBC WITH  DIFFERENTIAL/PLATELET - Abnormal; Notable for the following:    WBC 11.6 (*)    RBC 3.57 (*)    Hemoglobin 10.4 (*)    HCT 32.9 (*)    Neutro Abs 9.4 (*)    All other components within normal limits  I-STAT CG4 LACTIC ACID, ED - Abnormal; Notable for the following:    Lactic Acid, Venous 2.55 (*)    All other components within normal limits  CULTURE, BLOOD (ROUTINE X 2)  CULTURE, BLOOD (ROUTINE X 2)  URINE CULTURE  MRSA PCR SCREENING  URINALYSIS, ROUTINE W REFLEX MICROSCOPIC (NOT AT [facility])  BRAIN NATRIURETIC PEPTIDE  CBC  BASIC METABOLIC PANEL  PROTIME-INR  I-STAT CG4 LACTIC ACID, ED    EKG  EKG Interpretation  Date/Time:  Thursday April 14 2016 12:06:41 EDT  Ventricular Rate:  91 PR Interval:  204 QRS Duration: 78 QT Interval:  354 QTC Calculation: 435 R Axis:   5 Text Interpretation:  Sinus rhythm with Premature supraventricular complexes Otherwise normal ECG No significant change since last tracing Confirmed by Gonzales ([id number]) on 04/14/2016 3:11:27 PM       Radiology Dg Chest 2 View  Result Date: 04/14/2016 CLINICAL DATA:  Shortness of breath . weakness . EXAM: CHEST  2 VIEW COMPARISON:  03/31/2016 . FINDINGS: Pleural drainage catheter left in stable position. Stable left pleural effusion. No pneumothorax. Assistant left upper lobe density is unchanged. New onset right lower lobe infiltrate. Stable cardiomegaly no acute bony abnormality identified. IMPRESSION: 1. Pleural drainage catheter left in stable position. Stable left pleural effusion no pneumothorax. Previously noted left upper lobe density is unchanged. 2.  New onset right lower lobe infiltrate consistent with pneumonia. Electronically Signed   By: Marcello Moores  Register   On: 04/14/2016 14:06    Procedures Procedures (including critical care time)  Medications Ordered in ED Medications  guaiFENesin (MUCINEX) 12 hr tablet 600 mg (600 mg Oral Given 04/15/16 0019)  loratadine (CLARITIN) tablet 10 mg (not  administered)  aspirin EC tablet 81 mg (81 mg Oral Given 04/15/16 0019)  acetaminophen (TYLENOL) tablet 500 mg (500 mg Oral Given 04/14/16 2045)  enoxaparin (LOVENOX) injection 40 mg (40 mg Subcutaneous Given 04/15/16 0019)  sodium chloride flush (NS) 0.9 % injection 3 mL (3 mLs Intravenous Given 04/15/16 0019)  ceFEPIme (MAXIPIME) 1 g in dextrose 5 % 50 mL IVPB (not administered)  vancomycin (VANCOCIN) IVPB 750 mg/150 ml premix (not administered)  MEDLINE mouth rinse (15 mLs Mouth Rinse Not Given 04/15/16 0015)  vancomycin (VANCOCIN) 1,500 mg in sodium chloride 0.9 % 500 mL IVPB (0 mg Intravenous Stopped 04/14/16 1921)  ceFEPIme (MAXIPIME) 2 g in dextrose 5 % 50 mL IVPB (0 g Intravenous Stopped 04/14/16 1653)  iopamidol (ISOVUE-370) 76 % injection (100 mLs  Contrast Given 04/14/16 1921)     Initial Impression / Assessment and Plan / ED Course  I have reviewed the triage vital signs and the nursing notes.  Pertinent labs & imaging results that were available during my care of the patient were reviewed by me and considered in my medical decision making (see chart for details).  Clinical Course    82 year old male with history of small cell lung cancer status post chemotherapy and radiation completed a year ago, hypertension, arthritis presenting with shortness of breath as above. productive cough and decreased drainage from left pleural catheter reported. He is afebrile (after taking tylenol prior to presentation here today) with stable vital signs, borderline o2 sats on room air without supplemental oxygen.   CXR notable for no PTX or large recollection of pleural effusion on left, and new right sided opacities consistent with PNA. Cultures obtained. Will start antibiotics for HCAP given recent hospitalization and recent abx use for left sided PNA. Admitted to stepdown unit for further care of acute respiratory failure and HCAP.   Spoke with Dr. Cyndia Bent, on call physician for CT Surgery- will plan for Dr  Skipper Cliche to see patient and consider removal of tube tomorrow.   Case discussed with Dr. Christy Gentles, who oversaw management of this patient.    Final Clinical Impressions(s) / ED Diagnoses   Final diagnoses:  SOB (shortness of breath)    New Prescriptions New Prescriptions   No medications on file     Ivin Booty, MD 04/15/16 [phone]  Ripley Fraise, MD 04/16/16 [phone]

## 2016-04-14 NOTE — ED Notes (Signed)
Pt states had a temp this am-- took 2 tylenol this am. Pt has a drain in left side of chest that home health nurse and wife drain once a week. Only drained minimal amount this am. Pt states shortness of breath has increased this am making it unable to walk across room.

## 2016-04-14 NOTE — ED Notes (Signed)
Pt. Placed on oxygen 2 Liters, due to sats decreasing upon exertion.

## 2016-04-14 NOTE — ED Triage Notes (Signed)
Pt states he has been feeling increasingly SOB over the past few days. He has pleural drain that [facility] RN drains. Today HH RN was not able to get much fluid off of drain. Pt states he was instructed to come to ED. Pt denies any pain, fevers.

## 2016-04-14 NOTE — H&P (Addendum)
History and Physical  Travis Medina KDX:[id number] DOB: [birth year] DOA: 04/14/2016  Referring physician: EDP PCP: Travis Reel, MD   Chief Complaint: sob  HPI: Travis Medina is a 82 y.o. male  With h/o limited stage small cell lung cancer s/p concurrent chemo/XRT and whole brain radiation , all finished in 2016. In 2017, he developed pleural effusion required left sided chest tube which he drains once a week, the pleural effusion cytology was negative for malignancy, today he presented to the ED c/o sob, cough, low grade fever around 99 at home,he also report decreased chest tube output..   ED course: cxr with persistent left pleural effusion, new infiltrate on the right, wbc 11.6, cmp unremarkable, lactic acid 1.6, he is given vanc/zosyn, hospitalist called to admit the patient for HCAP, thoracic surgeon paged by EDP. Patient denies chest pain, no n/v, no diarrhea, no lower extremity edema, report baseline DOE, but able to walk to his mailbox normally, though the last two days he get significant DOE with walking a few steps.   patient 's vital stable initially, later in the ED he started to have more tachypnea and o2 sats in high 80's and low 90's with 1.5liter oxygen supplement, I requested EDP to change admission status to stepdown.  Review of Systems:  Detail per HPI, Review of systems are otherwise negative  Past Medical History:  Diagnosis Date  . Arthritis   . Essential hypertension   . Hypertension   . Lung cancer ([place])   . S/P chemotherapy, time since 4-12 weeks   . S/P radiation therapy   . Shortness of breath dyspnea   . Wears glasses    Past Surgical History:  Procedure Laterality Date  . CHEST TUBE INSERTION Left 03/03/2016   Procedure: INSERTION PLEURAL DRAINAGE CATHETER;  Surgeon: Melrose Nakayama, MD;  Location: [facility];  Service: Thoracic;  Laterality: Left;  . COLONOSCOPY    . LUNG BIOPSY    . TOTAL KNEE ARTHROPLASTY     bilateral   Social History:   reports that he quit smoking about 40 years ago. His smoking use included Cigarettes. He has a 25.00 pack-year smoking history. He has never used smokeless tobacco. He reports that he does not drink alcohol or use drugs. Patient lives at home & is able to participate in activities of daily living independently , walks with a cane, has baseline DOE  Allergies  Allergen Reactions  . Morphine And Related Itching and Rash    Family History  Problem Relation Age of Onset  . Heart failure Mother   . Colon cancer Sister       Prior to Admission medications   Medication Sig Start Date End Date Taking? Authorizing Provider  acetaminophen (TYLENOL) 500 MG tablet Take 1 tablet (500 mg total) by mouth every 6 (six) hours as needed for mild pain. Patient taking differently: Take 1,000 mg by mouth every 6 (six) hours as needed for mild pain.  10/25/14  Yes Florencia Reasons, MD  aspirin EC 81 MG tablet Take 81 mg by mouth at bedtime.   Yes Historical Provider, MD  loratadine (CLARITIN) 10 MG tablet Take 10 mg by mouth daily.   Yes Historical Provider, MD  alendronate (FOSAMAX) 70 MG tablet Take 70 mg by mouth every 14 (fourteen) days. Last dose 02/13/16 06/09/15   Historical Provider, MD  guaiFENesin (MUCINEX) 600 MG 12 hr tablet Take 1 tablet (600 mg total) by mouth 2 (two) times daily as needed for cough  or to loosen phlegm. Patient not taking: Reported on 04/06/2016 03/10/16   Nani Skillern, PA-C  lisinopril (PRINIVIL,ZESTRIL) 10 MG tablet Take 1 tablet (10 mg total) by mouth at bedtime. Patient not taking: Reported on 04/06/2016 03/10/16   Nani Skillern, PA-C  traMADol (ULTRAM) 50 MG tablet Take 1 tablet (50 mg total) by mouth every 6 (six) hours as needed for moderate pain. Patient not taking: Reported on 04/06/2016 03/10/16   Nani Skillern, PA-C    Physical Exam: BP 118/64   Pulse 87   Temp 98.1 F (36.7 C) (Oral)   Resp (!) 36   Ht '5\' 10"'$  (1.778 m)   Wt 78 kg (172 lb)   SpO2 92%   BMI  24.68 kg/m   General:  sob with talking, frail Eyes: PERRL ENT: unremarkable Neck: supple, no JVD Cardiovascular: RRR Respiratory: diminished more on the left side, no wheezing, no rales, no rhonchi Abdomen: soft/ND/ND, positive bowel sounds Skin: no rash Musculoskeletal:  No edema Psychiatric: calm/cooperative Neurologic: no focal findings            Labs on Admission:  Basic Metabolic Panel:  Recent Labs Lab 04/14/16 1616  NA 137  K 4.5  CL 103  CO2 24  GLUCOSE 122*  BUN 30*  CREATININE 1.14  CALCIUM 9.8   Liver Function Tests:  Recent Labs Lab 04/14/16 1616  AST 30  ALT 25  ALKPHOS 124  BILITOT 0.3  PROT 7.5  ALBUMIN 3.0*   No results for input(s): LIPASE, AMYLASE in the last 168 hours. No results for input(s): AMMONIA in the last 168 hours. CBC:  Recent Labs Lab 04/14/16 1616  WBC 11.6*  NEUTROABS 9.4*  HGB 10.4*  HCT 32.9*  MCV 92.2  PLT 264   Cardiac Enzymes: No results for input(s): CKTOTAL, CKMB, CKMBINDEX, TROPONINI in the last 168 hours.  BNP (last 3 results)  Recent Labs  04/14/16 1616  BNP 93.7    ProBNP (last 3 results) No results for input(s): PROBNP in the last 8760 hours.  CBG: No results for input(s): GLUCAP in the last 168 hours.  Radiological Exams on Admission: Dg Chest 2 View  Result Date: 04/14/2016 CLINICAL DATA:  Shortness of breath . weakness . EXAM: CHEST  2 VIEW COMPARISON:  03/31/2016 . FINDINGS: Pleural drainage catheter left in stable position. Stable left pleural effusion. No pneumothorax. Assistant left upper lobe density is unchanged. New onset right lower lobe infiltrate. Stable cardiomegaly no acute bony abnormality identified. IMPRESSION: 1. Pleural drainage catheter left in stable position. Stable left pleural effusion no pneumothorax. Previously noted left upper lobe density is unchanged. 2.  New onset right lower lobe infiltrate consistent with pneumonia. Electronically Signed   By: Marcello Moores  Register    On: 04/14/2016 14:06    EKG: Independently reviewed. Sinus rhythm, no acute ST/T changes, QTc wnl  Assessment/Plan Present on Admission: **None**  Acute hypoxic respiratory failure/Sob/DOE:  Likely multifactorial : Due to cxr with new infiltrate on right lung, persistent left sided pleural effusion, will order CTA chest to r/o PE in a patient with cancer history and baseline decreased mobility due to DOE.  HCAP: patient was hospitalized in 7 and 03/2016, continue vanc/cefepime that was started in the ED. Blood culture drawn in the ED.  Left sided pleural effusion with decreased drain output: thoracic surgery paged by EDP.  small cell lung cancer s/p concurrent chemo/XRT and whole brain radiation , all finished in 2016.  At risk of needing bipap  or intubation if respiratory status does not improve.  DVT prophylaxis: lovenox  Consultants: thoracic surgery  Code Status: full , confirmed with patient  Family Communication:  Patient   Disposition Plan: admit to stepdown  Time spent: 68mns  Kysa Calais MD, PhD [facility] Pager [phone] 7PM-7AM, please contact night-coverage at [email or web address], password [email or web address]

## 2016-04-14 NOTE — ED Notes (Signed)
EKG was performed at 1206 by [phone]

## 2016-04-14 NOTE — Progress Notes (Signed)
Pharmacy Antibiotic Note  Travis Medina is a 82 y.o. male admitted on 04/14/2016 with shortness of breath. Pharmacy has been consulted for vancomycin and cefepime dosing. Patient has pleural drain which was placed in July 2017.  Afebrile, WBC 11.6, and lactate 1.61.   Plan: Vancomycin 1g IV x1 in ED, then 750 mg every 12 hours  Vancomycin trough at steady state and PRN Cefepime 2g IV x1 in ED, then 1g every 12 hours  Monitor renal function and clinical picture Follow up cultures and antibiotic length of therapy   Height: '5\' 10"'$  (177.8 cm) Weight: 172 lb (78 kg) IBW/kg (Calculated) : 73  Temp (24hrs), Avg:98.1 F (36.7 C), Min:98.1 F (36.7 C), Max:98.1 F (36.7 C)   Recent Labs Lab 04/14/16 1616 04/14/16 1640  WBC 11.6*  --   CREATININE 1.14  --   LATICACIDVEN  --  1.61    Estimated Creatinine Clearance: 53.4 mL/min (by C-G formula based on SCr of 1.14 mg/dL).    Allergies  Allergen Reactions  . Morphine And Related Itching and Rash    Antimicrobials this admission: 9/7 Vanc >>  9/7 Cefepime  >>   Dose adjustments this admission: N/A  Microbiology results: 9/7 BCx: pending  9/7 UCx: pending   Thank you for allowing pharmacy to be a part of this patient's care.  Argie Ramming, PharmD Pharmacy Resident  Pager [phone] 04/14/16 6:10 PM

## 2016-04-14 NOTE — ED Notes (Signed)
Pt. Ate dinner at the bedside. Family brought pt. Subway sandwich and water,

## 2016-04-14 NOTE — ED Notes (Signed)
Pt. Transferred to  CT scan. Pt. Stable upon transfer

## 2016-04-15 DIAGNOSIS — F411 Generalized anxiety disorder: Secondary | ICD-10-CM

## 2016-04-15 DIAGNOSIS — R0600 Dyspnea, unspecified: Secondary | ICD-10-CM

## 2016-04-15 DIAGNOSIS — C3490 Malignant neoplasm of unspecified part of unspecified bronchus or lung: Secondary | ICD-10-CM

## 2016-04-15 DIAGNOSIS — Z7189 Other specified counseling: Secondary | ICD-10-CM

## 2016-04-15 DIAGNOSIS — J9690 Respiratory failure, unspecified, unspecified whether with hypoxia or hypercapnia: Secondary | ICD-10-CM

## 2016-04-15 DIAGNOSIS — Z515 Encounter for palliative care: Secondary | ICD-10-CM

## 2016-04-15 DIAGNOSIS — C787 Secondary malignant neoplasm of liver and intrahepatic bile duct: Secondary | ICD-10-CM

## 2016-04-15 DIAGNOSIS — J9601 Acute respiratory failure with hypoxia: Secondary | ICD-10-CM

## 2016-04-15 DIAGNOSIS — J189 Pneumonia, unspecified organism: Principal | ICD-10-CM

## 2016-04-15 LAB — BASIC METABOLIC PANEL
ANION GAP: 9 (ref 5–15)
BUN: 24 mg/dL — AB (ref 6–20)
CHLORIDE: 105 mmol/L (ref 101–111)
CO2: 23 mmol/L (ref 22–32)
Calcium: 8.9 mg/dL (ref 8.9–10.3)
Creatinine, Ser: 1.13 mg/dL (ref 0.61–1.24)
GFR calc Af Amer: >60 mL/min (ref >60)
GFR, EST NON AFRICAN AMERICAN: 59 mL/min — AB (ref >60)
GLUCOSE: 116 mg/dL — AB (ref 65–99)
POTASSIUM: 4.3 mmol/L (ref 3.5–5.1)
Sodium: 137 mmol/L (ref 135–145)

## 2016-04-15 LAB — CBC
HEMATOCRIT: 27.4 % — AB (ref 39.0–52.0)
HEMOGLOBIN: 8.8 g/dL — AB (ref 13.0–17.0)
MCH: 29.1 pg (ref 26.0–34.0)
MCHC: 32.1 g/dL (ref 30.0–36.0)
MCV: 90.7 fL (ref 78.0–100.0)
PLATELETS: 245 10*3/uL (ref 150–400)
RBC: 3.02 MIL/uL — AB (ref 4.22–5.81)
RDW: 14 % (ref 11.5–15.5)
WBC: 12.4 10*3/uL — AB (ref 4.0–10.5)

## 2016-04-15 LAB — PROTIME-INR
INR: 1.27
Prothrombin Time: 16 seconds — ABNORMAL HIGH (ref 11.4–15.2)

## 2016-04-15 LAB — URINE CULTURE: CULTURE: NO GROWTH

## 2016-04-15 LAB — MRSA PCR SCREENING: MRSA by PCR: NEGATIVE

## 2016-04-15 MED ORDER — FUROSEMIDE 10 MG/ML IJ SOLN
40.0000 mg | Freq: Once | INTRAMUSCULAR | Status: AC
  Administered 2016-04-15: 40 mg via INTRAVENOUS
  Filled 2016-04-15: qty 4

## 2016-04-15 MED ORDER — LORAZEPAM 2 MG/ML IJ SOLN
1.0000 mg | INTRAMUSCULAR | Status: DC | PRN
  Administered 2016-04-15 – 2016-04-17 (×4): 1 mg via INTRAVENOUS
  Filled 2016-04-15 (×5): qty 1

## 2016-04-15 MED ORDER — DIPHENHYDRAMINE HCL 12.5 MG/5ML PO ELIX
12.5000 mg | ORAL_SOLUTION | Freq: Four times a day (QID) | ORAL | Status: DC | PRN
  Filled 2016-04-15: qty 5

## 2016-04-15 MED ORDER — MORPHINE (PF) INJECTION FOR INHALATION 10 MG/ML: 10.0000 mg | RESPIRATORY_TRACT | Status: DC | PRN

## 2016-04-15 MED ORDER — ORAL CARE MOUTH RINSE
15.0000 mL | Freq: Two times a day (BID) | OROMUCOSAL | Status: DC
  Administered 2016-04-15 – 2016-04-17 (×4): 15 mL via OROMUCOSAL

## 2016-04-15 MED ORDER — MORPHINE (PF) INJECTION FOR INHALATION 10 MG/ML
10.0000 mg | RESPIRATORY_TRACT | Status: DC
  Filled 2016-04-15: qty 1

## 2016-04-15 MED ORDER — LORAZEPAM 2 MG/ML IJ SOLN
0.5000 mg | Freq: Two times a day (BID) | INTRAMUSCULAR | Status: DC
  Administered 2016-04-15 – 2016-04-16 (×4): 0.5 mg via INTRAVENOUS
  Filled 2016-04-15 (×3): qty 1

## 2016-04-15 MED ORDER — FENTANYL CITRATE (PF) 100 MCG/2ML IJ SOLN
25.0000 ug | INTRAMUSCULAR | Status: DC | PRN
  Administered 2016-04-16 (×2): 25 ug via INTRAVENOUS
  Filled 2016-04-15 (×2): qty 2

## 2016-04-15 MED ORDER — FENTANYL CITRATE (PF) 100 MCG/2ML IJ SOLN: 12.5000 ug | INTRAMUSCULAR | Status: DC | PRN

## 2016-04-15 NOTE — Consult Note (Signed)
Consultation Note Date: 04/15/2016   Patient Name: Travis Medina  DOB: 1934-01-18  MRN: [id number]  Age / Sex: 82 y.o., male  PCP: Shon Baton, MD Referring Physician: Modena Jansky, MD  Reason for Consultation: Establishing goals of care  HPI/Patient Profile: 82 y.o. male  with past medical history of limited stage small cell lung cancer diagnosed 09/2014 now s/p 4 cycles of chemotherapy with radiation, including prophylactic whole brain radiation, admitted on 04/14/2016 with increased SOB and found to have recurrent PNA. He has a Pleurx catheter in place from previous admission at the end of July for PNA with large left pleural effusion. Pleural fluid had been tested and was negative for malignancy, but CTA chest performed 04/14/16 showed worsening tumor of left lung, new and enlarging hepatic metastatic lesion, and increasing mediastinal adenopathy.   Clinical Assessment and Goals of Care: Saw patient with PA-C Imogene Burn. Patient and present family members (wife, sister, and brother-in-law) expressed understanding that patient is not expected to survive this hospitalization given his progression of cancer and need for respiratory support. Discussed goals of care: Patient and wife agreed that he is not to be intubated. Code status is DNR. Patient would like to alert and able to talk to his family as long as possible. He ultimately would like to be comfortable but expresses desire to try BiPAP as breathing stats worsens, hoping it will buy him just a little more time with his family. His wife stated that she would ask for discontinuation of BiPAP as soon as patient indicates it is not comfortable or what he wants. She stated she would ask for discontinuation of BiPAP if patient becomes unable to express himself. Wife is hoping she can stay in patient's room while he is in the hospital. Patient and wife would like for  him to try something for anxiety and are interested in increasing pain medication.   Patient managed a warehouse/trucking business for 30 years.  Their children and grand children live locally.  He is active in his church (christian/protestant).  They appear to have excellent family support.  He was eating well until recently but had lost 30 lbs over the last 6 months.  He is living at home with his wife and was independent with ADLs but recently would become very dyspneic taking just a few steps.   Patient had been doing well.  Recent doctor visits had indicated that he was doing fine.  This exacerbation of his illness is very sudden to the family.  HCPOA: Wife, Patient is cognizant and making his own decisions at this point.   SUMMARY OF RECOMMENDATIONS   Travis Medina is an 82-y/o male with history of limited stage small cell lung cancer who presents in acute respiratory failure now on non-rebreather at 15 L/min. Plan to trial BiPAP as needed, per patient and family's wishes. Communicated plan to patient's nurse and respiratory therapist. Expect to transition to [place] in next 24-48 hours.   Code Status/Advance Care Planning:  DNR   Symptom  Management:   IV Fentanyl 25 mcg q15 minutes prn (allergic to morphine) for dyspnea / discomfort  IV ativan 0.5 mg bid and 1 mg q4h prn anxiety  Palliative Prophylaxis:   Delirium Protocol and Oral Care  Additional Recommendations (Limitations, Scope, Preferences):  No Artificial Feeding and No Tracheostomy.  Trending strongly towards comfort measures  BIPAP PRN dyspnea - this is a comfort measure.  Psycho-social/Spiritual:   Desire for further Chaplaincy support:yes; consult order placed  Additional Recommendations: Caregiving  Support/Resources  Prognosis:   Hours - Days  Discharge Planning: Anticipated Hospital Death      Primary Diagnoses: Present on Admission: . HCAP (healthcare-associated pneumonia) . PNA  (pneumonia) . Respiratory failure with hypoxia ([place])   I have reviewed the medical record, interviewed the patient and family, and examined the patient. The following aspects are pertinent.  Past Medical History:  Diagnosis Date  . Arthritis   . Essential hypertension   . Hypertension   . Lung cancer ([place])   . S/P chemotherapy, time since 4-12 weeks   . S/P radiation therapy   . Shortness of breath dyspnea   . Wears glasses    Social History   Social History  . Marital status: Married    Spouse name: N/A  . Number of children: N/A  . Years of education: N/A   Social History Main Topics  . Smoking status: Former Smoker    Packs/day: 1.00    Years: 25.00    Types: Cigarettes    Quit date: 08/09/1975  . Smokeless tobacco: Never Used  . Alcohol use No  . Drug use: No  . Sexual activity: Not Currently   Other Topics Concern  . None   Social History Narrative  . None   Family History  Problem Relation Age of Onset  . Heart failure Mother   . Colon cancer Sister    Scheduled Meds: . aspirin EC  81 mg Oral QHS  . ceFEPime (MAXIPIME) IV  1 g Intravenous Q12H  . enoxaparin (LOVENOX) injection  40 mg Subcutaneous QHS  . guaiFENesin  600 mg Oral BID  . loratadine  10 mg Oral Daily  . LORazepam  0.5 mg Intravenous BID  . mouth rinse  15 mL Mouth Rinse BID  . sodium chloride flush  3 mL Intravenous Q12H  . vancomycin  750 mg Intravenous Q12H   Continuous Infusions:  PRN Meds:.acetaminophen, diphenhydrAMINE, fentaNYL (SUBLIMAZE) injection, LORazepam Medications Prior to Admission:  Prior to Admission medications   Medication Sig Start Date End Date Taking? Authorizing Provider  acetaminophen (TYLENOL) 500 MG tablet Take 1 tablet (500 mg total) by mouth every 6 (six) hours as needed for mild pain. Patient taking differently: Take 1,000 mg by mouth every 6 (six) hours as needed for mild pain.  10/25/14  Yes Florencia Reasons, MD  aspirin EC 81 MG tablet Take 81 mg by mouth at  bedtime.   Yes Historical Provider, MD  loratadine (CLARITIN) 10 MG tablet Take 10 mg by mouth daily.   Yes Historical Provider, MD  alendronate (FOSAMAX) 70 MG tablet Take 70 mg by mouth every 14 (fourteen) days. Last dose 02/13/16 06/09/15   Historical Provider, MD  guaiFENesin (MUCINEX) 600 MG 12 hr tablet Take 1 tablet (600 mg total) by mouth 2 (two) times daily as needed for cough or to loosen phlegm. Patient not taking: Reported on 04/06/2016 03/10/16   Nani Skillern, PA-C  lisinopril (PRINIVIL,ZESTRIL) 10 MG tablet Take 1 tablet (10 mg total)  by mouth at bedtime. Patient not taking: Reported on 04/06/2016 03/10/16   Nani Skillern, PA-C  traMADol (ULTRAM) 50 MG tablet Take 1 tablet (50 mg total) by mouth every 6 (six) hours as needed for moderate pain. Patient not taking: Reported on 04/06/2016 03/10/16   Nani Skillern, PA-C   Allergies  Allergen Reactions  . Morphine And Related Itching and Rash   Review of Systems  Respiratory: Positive for cough and shortness of breath. Negative for wheezing.   Cardiovascular: Negative for chest pain.    Physical Exam  Constitutional: He is oriented to person, place, and time. He appears well-developed and well-nourished.  HENT:  Head: Normocephalic.  Nose: Nose normal.  Mouth/Throat: Oropharynx is clear and moist.  Neck: Normal range of motion. Neck supple. No JVD present.  Cardiovascular:  Tachycardic. No m/r/g appreciated.  Pulmonary/Chest:  Tachypneic to 30s-40s with belly breathing on non-rebreather mask. No wheezing appreciated.  Abdominal: Soft. Bowel sounds are normal. He exhibits no distension. There is no tenderness. There is no rebound and no guarding.  Musculoskeletal: He exhibits no edema.  Neurological: He is alert and oriented to person, place, and time. No cranial nerve deficit.  Skin: Skin is warm and dry. Capillary refill takes more than 3 seconds.  Psychiatric: He has a normal mood and affect. Judgment and  thought content normal.    Vital Signs: BP 114/60   Pulse (!) 104   Temp 98.3 F (36.8 C) (Oral)   Resp (!) 38   Ht '5\' 10"'$  (1.778 m)   Wt 173 lb 15.1 oz (78.9 kg)   SpO2 100%   BMI 24.96 kg/m  Pain Assessment: No/denies pain   Pain Score: 0-No pain  SpO2: SpO2: 100 % O2 Device:SpO2: 100 % O2 Flow Rate: .O2 Flow Rate (L/min): 6 L/min  IO: Intake/output summary:   Intake/Output Summary (Last 24 hours) at 04/15/16 1535 Last data filed at 04/15/16 1200  Gross per 24 hour  Intake              200 ml  Output             3225 ml  Net            -3025 ml    LBM: Last BM Date:  (PTA ) Baseline Weight: Weight: 172 lb (78 kg) Most recent weight: Weight: 173 lb 15.1 oz (78.9 kg)     Palliative Assessment/Data:   Flowsheet Rows   Flowsheet Row Most Recent Value  Intake Tab  Referral Department  Hospitalist  Unit at Time of Referral  ICU  Palliative Care Primary Diagnosis  Pulmonary  Date Notified  04/15/16  Palliative Care Type  New Palliative care  Reason for referral  Clarify Goals of Care  Date of Admission  04/14/16  Date first seen by Palliative Care  04/15/16  # of days Palliative referral response time  0 Day(s)  # of days IP prior to Palliative referral  1  Clinical Assessment  Palliative Performance Scale Score  30%  Dyspnea Max Last 24 Hours  9  Dyspnea Min Last 24 hours  5  Anxiety Max Last 24 Hours  5  Anxiety Min Last 24 Hours  3  Psychosocial & Spiritual Assessment  Palliative Care Outcomes  Patient/Family meeting held?  Yes  Who was at the meeting?  patient, wife, sister, brother in law  Palliative Care Outcomes  Improved non-pain symptom therapy, Clarified goals of care  Patient/Family wishes: Interventions discontinued/not started  Mechanical Ventilation, Tube feedings/TPN  Palliative Care follow-up planned  No      Time In: 1:40 Time Out: 2:50 Time Total: 70 minutes Greater than 50%  of this time was spent counseling and coordinating care  related to the above assessment and plan.  Signed by: Darci Needle, MD  [place], PGY-2  Imogene Burn, [place] [facility] Pager: [phone]   Please contact Palliative Medicine Team phone at [phone] for questions and concerns.  For individual provider: See Shea Evans

## 2016-04-15 NOTE — Progress Notes (Signed)
Travis Medina   DOB:09/29/1933   FX#:[id number]   This is a patient of Dr. [name] with prior diagnosis of lung cancer. The patient has completed chemotherapy for limited stage lung cancer. Recently, he complained of shortness of breath and recurrent pleural effusion. The patient had significant respiratory distress. He underwent repeat CT scan yesterday which show extensive progression of malignancy and evidence of new liver metastasis. Subjective: The patient appears comfortable surrounded by family members. He denies pain. He has subjective sensation of shortness of breath  Objective:  Vitals:   04/15/16 1500 04/15/16 1600  BP:  (!) 86/47  Pulse: 99 87  Resp: (!) 38 (!) 28  Temp:       Intake/Output Summary (Last 24 hours) at 04/15/16 1745 Last data filed at 04/15/16 1600  Gross per 24 hour  Intake              200 ml  Output             3575 ml  Net            -3375 ml    GENERAL:alert,With moderate respiratory distress, on 100% nonrebreather NEURO: alert & oriented x 3 with fluent speech, no focal motor/sensory deficits   Labs:  Lab Results  Component Value Date   WBC 12.4 (H) 04/15/2016   HGB 8.8 (L) 04/15/2016   HCT 27.4 (L) 04/15/2016   MCV 90.7 04/15/2016   PLT 245 04/15/2016   NEUTROABS 9.4 (H) 04/14/2016    Lab Results  Component Value Date   NA 137 04/15/2016   K 4.3 04/15/2016   CL 105 04/15/2016   CO2 23 04/15/2016    Studies:  Dg Chest 2 View  Result Date: 04/14/2016 CLINICAL DATA:  Shortness of breath . weakness . EXAM: CHEST  2 VIEW COMPARISON:  03/31/2016 . FINDINGS: Pleural drainage catheter left in stable position. Stable left pleural effusion. No pneumothorax. Assistant left upper lobe density is unchanged. New onset right lower lobe infiltrate. Stable cardiomegaly no acute bony abnormality identified. IMPRESSION: 1. Pleural drainage catheter left in stable position. Stable left pleural effusion no pneumothorax. Previously noted left upper lobe  density is unchanged. 2.  New onset right lower lobe infiltrate consistent with pneumonia. Electronically Signed   By: Marcello Moores  Register   On: 04/14/2016 14:06   Ct Angio Chest Pe W Or Wo Contrast  Result Date: 04/14/2016 CLINICAL DATA:  SOB. Hx radiation therapy, chemotherapy, HTN, lung cancer, and chest tube placement. 60 mls Isovue 370 IV. EXAM: CT ANGIOGRAPHY CHEST WITH CONTRAST TECHNIQUE: Multidetector CT imaging of the chest was performed using the standard protocol during bolus administration of intravenous contrast. Multiplanar CT image reconstructions and MIPs were obtained to evaluate the vascular anatomy. CONTRAST:  100 cc Isovue 370 COMPARISON:  02/19/2016 FINDINGS: Despite efforts by the technologist and patient, motion artifact is present on today's exam and could not be eliminated. This reduces exam sensitivity and specificity. Cardiovascular: Suboptimal contrast timing, with contrast of fecal density in the pulmonary arteries and systemic arterial supply. In combination with the motion artifact this results in reduced diagnostic sensitivity and specificity. I am doubtful that this justified is another contrast dose and rescanning. No filling defect is identified in the pulmonary arterial tree to suggest pulmonary embolus. Coronary, aortic arch, and branch vessel atherosclerotic vascular disease. No acute aortic findings are identified in the thorax. Mediastinum/Nodes: Right upper paratracheal node 1.2 cm in short axis on image 15/4, formerly 0.9 cm. Indistinct right hilar node  approximately 1.0 cm in short axis on image 39/4. Subcarinal node 1.6 cm in short axis on image 46/4, previously 1.2 cm. There is progressive tumor along the left mediastinal border. Tumor in the pericardial space measures 2.3 cm in thickness on image 66/4, previously only about 0.7 cm at this location. Lungs/Pleura: Extensive pleural rind of tumor on the left. Small to moderate left pleural effusion with loculated  hydropneumothorax, left pleural drain in place. The pleural rind is irregular and multinodular, with some of the atelectatic left lower lobe tethered peripherally. The pleural rind is much worsened compared to 02/19/16 on the left. There is also some soft tissue density pleural thickening on the right measuring up to 7 mm in thickness on image 24/4. Patchy airspace opacities are present throughout the right lung, especially in the right lower lobe. On the left side the appearance is more characteristic of tethered atelectatic lung in the upper in lower lobes paraseptal emphysema noted. Upper Abdomen: New and enlarging hypodense lesions in the right hepatic lobe and lateral segment left hepatic lobe, compatible with metastatic tumor. Musculoskeletal: Thoracic spondylosis. Review of the MIP images confirms the above findings. IMPRESSION: 1. No embolus identified, reduced sensitivity due to suboptimal contrast timing and motion artifact. 2. Extensive progression of malignancy with thick irregular pleural rind of tumor on the left with tethering atelectasis, and new and enlarging hepatic metastatic lesions. 3. New airspace opacities throughout most of the right lung but especially favoring the right lower lobe and right middle lobe. Possibilities might include multilobar pneumonia, pulmonary hemorrhage, or asymmetric edema. 4. Increasing mediastinal adenopathy. 5. Coronary, aortic arch, and branch vessel atherosclerotic vascular disease. Electronically Signed   By: Van Clines M.D.   On: 04/14/2016 19:59    Assessment & Plan:  Recurrent lung cancer with new evidence of metastatic cancer to the liver Progressive respiratory failure  I had the pleasure of meeting with the patient and family members. The patient is realistic with goals of care and understood life expectancy without aggressive chemotherapy He does not want to be intubated. He wants to remain on supportive care measures and until all family  members are able to see him before transitioning his care to comfort measures. I addressed all his questions and concerns I will inform his primary oncologist of the plan of care I appreciate palliative care support Will sign off  [facility], [place], MD 04/15/2016  5:45 PM

## 2016-04-15 NOTE — Progress Notes (Signed)
      [address]       [place] [address]             [phone]      [name] was readmitted last night with fever and worsening shortness of breath  He is an 82 yo man with a history of small cell lung cancer s/p chemo and radiation. He had a recurrent left pleural effusion. I placed a left pleural catheter on 03/03/16. His CXR improved but there was some loculated fluid that never completely drained. He was admitted with pneumonia a few days later and that resolved with antibiotics.  He has been having minimal drainage from his pleural tube and it was only being drained once a week. He had almost no fluid from it the last time it was drained. His CXR had remained unchanged.  He presented last night with fever and dyspnea. He was tachypnic. A CT chest showed no sign of PE. He had new airspace disease in the right lower and middle lobes worrisome for pneumonia. There was progression of disease in the mediastinal nodes and left pleural space. He also has liver mets.  He currently is dyspneic at rest with 6 L [place] O2.  BP (!) 106/58   Pulse 97   Temp 98.3 F (36.8 C) (Oral)   Resp (!) 37   Ht '5\' 10"'$  (1.778 m)   Wt 173 lb 15.1 oz (78.9 kg)   SpO2 100%   BMI 24.96 kg/m  Tachypnea. mild respiratory distress Diminished BS left base laterally.  Small amount of serous fluid in pleural catheter. Site OK  I reviewed the x rays and CT chest. He has a small amount of loculated fluid in the left chest that is unchanged by CXR. Has never been drained by the tube.  82 yo ma with extensive small cell carcinoma who presents with probable pneumonia affecting the right lower and middle lobes. This is a serious situation as his left lung is already compromised. He has been started on appropriate antibiotics but has acute on chronic respiratory failure.   He would probably require intubation, but currently is refusing. He is concerned that he might not come off the ventilator  which is a distinct possibility. He may be willing to try BIPAP, but his primary concern is being able to talk to his family.   No surgical options in this case. I provided emotional support.  Revonda Standard Roxan Hockey, MD [facility] [phone]

## 2016-04-15 NOTE — Care Management Note (Signed)
Case Management Note  Patient Details  Name: MAOR MECKEL MRN: [id number] Date of Birth: [birth year]  Subjective/Objective:     Adm w pneumonia, ch tube on adm              Action/Plan: lives at home w fam, act w [facility] for hhrn   Expected Discharge Date:                  Expected Discharge Plan:  [place]  In-House Referral:  Hospice / Palliative Care  Discharge planning Services     Post Acute Care Choice:  Resumption of Svcs/PTA Provider Choice offered to:     DME Arranged:    DME Agency:     HH Arranged:  RN [place] Agency:  [place]  Status of Service:  In process, will continue to follow  If discussed at Long Length of Stay Meetings, dates discussed:    Additional Comments:nse states for pal care consult, will await further orders  Lacretia Leigh, RN 04/15/2016, 10:05 AM

## 2016-04-15 NOTE — Progress Notes (Signed)
[facility]  Patient Status: Active (receiving services up to time of hospitalization)  [facility] is providing the following services: RN  If patient discharges after hours, please call [phone].   Janae Sauce 04/15/2016, 9:48 AM

## 2016-04-15 NOTE — Progress Notes (Signed)
PROGRESS NOTE  Travis Medina  PTE:[id number] DOB: November 04, 1933  DOA: 04/14/2016 PCP: [name], MD   Brief Narrative:  82 year old male with a PMH of limited stage small cell lung cancer status post systemic chemotherapy with carboplatin and etoposide status post 4 cycles concurrent with radiation and almost complete response followed by whole brain radiation, last seen by his oncologist on 04/06/16, being observed in next follow-up appointment in November 2017, left Pleurx catheter placed in July 2017 for recurrent left pleural effusion, HTN, baseline chronic dyspnea, presented to [facility] ED on 04/14/16 with complaints of worsening dyspnea at rest and with minimal exertion, low-grade fever, dry cough, no chest pain and decreased drainage through left Pleurx. CTA chest showed advanced/metastatic lung cancer to liver and possible RML, RLL pneumonia versus lymphangitic cancer spread. Admitted to stepdown unit for acute hypoxic respiratory failure secondary to metastatic lung cancer and possible pneumonia. Extensively discussed multiple times with patient and spouse. Coordinated care with TCTS, Oncology and Palliative Care. Patient eventually opted for comfort oriented care & DO NOT RESUSCITATE and no aggressive measures.   Assessment & Plan:   Active Problems:   HCAP (healthcare-associated pneumonia)   PNA (pneumonia)   Respiratory failure with hypoxia ([place])   Extensive/metastatic small cell carcinoma - Main reason for patient's respiratory decline - Discussed with oncologist on call who indicated that overall patient has a poor prognosis due to advanced age, frail condition and metastatic/advanced disease. They could consider further chemotherapy as outpatient when patient is medically stabilized but this too will not be curative. - Discussed with patient and spouse extensively including oncologists input. Patient was very clear and did not wish to pursue any further cancer treatment or aggressive  interventions. He opted for DO NOT RESUSCITATE and comfort oriented care.  RML & RLL HCAP versus lymphangitic spread of cancer - Patient currently wishes to continue IV antibiotics.  Acute respiratory failure with hypoxia - Secondary to advanced lung cancer and possible pneumonia. DO NOT RESUSCITATE. Supportive care. - As discussed with TCTS, not much pleural effusion to drain and not likely the cause of his decompensation.  Anemia - likely d/t chronic disease  Essential hypertension - Controlled  Adult failure to thrive - Secondary to metastatic malignancy and advanced age.  Palliative care encounter - I met with patient and family multiple times and had extensive discussions, went over current diagnosis, treatment options, prognosis, consultation with multiple specialists and eventually patient opted for comfort oriented care. - Palliative care was consulted for symptom management. As per their discussion with patient, he wishes to try BiPAP briefly.   DVT prophylaxis: Lovenox Code Status:  DO NOT RESUSCITATE Family Communication:  Discussed with spouse Disposition Plan:  To be determined. Hospital death versus home with hospice.   Consultants:   TCTS  Palliative care team  Medical Oncology  Procedures:   None  Antimicrobials:   IV Vancomycin 9/7>  IV Zosyn 9/7>    Subjective: Seen this morning. Stated that difficulty breathing was slightly better but easily brought on by talking or minimal activity. Dry cough. No chest pain. Patient seen with his 2 RNs in room.  Objective:  Vitals:   04/15/16 1100 04/15/16 1200 04/15/16 1300 04/15/16 1400  BP:  (!) 106/58  114/60  Pulse: (!) 111 97 (!) 108 (!) 104  Resp: (!) 28 (!) 37 (!) 35 (!) 38  Temp:      TempSrc:      SpO2: 93% 100% 100% 100%  Weight:  Height:        Intake/Output Summary (Last 24 hours) at 04/15/16 1616 Last data filed at 04/15/16 1200  Gross per 24 hour  Intake              200 ml    Output             3225 ml  Net            -3025 ml   Filed Weights   04/14/16 1216 04/14/16 2330  Weight: 78 kg (172 lb) 78.9 kg (173 lb 15.1 oz)    Examination:  General exam: Pleasant elderly male, ill-looking, lying propped up in bed with intermittent tachypnea especially with talking or minimal activity Respiratory system: decreased breath sounds bilaterally, left greater than signed right. Intermittent nasal flaring and increased work of breathing. Left Pleurx site clean and dry. Cardiovascular system: S1 & S2 heard, RRR. No JVD, murmurs, rubs, gallops or clicks. No pedal edema. Telemetry: Sinus rhythm. Gastrointestinal system: Abdomen is nondistended, soft and nontender. No organomegaly or masses felt. Normal bowel sounds heard. Central nervous system: Alert and oriented. No focal neurological deficits. Extremities: Symmetric 5 x 5 power. Skin: No rashes, lesions or ulcers Psychiatry: Judgement and insight appear normal. Mood & affect appropriate.     Data Reviewed: I have personally reviewed following labs and imaging studies  CBC:  Recent Labs Lab 04/14/16 1616 04/15/16 0235  WBC 11.6* 12.4*  NEUTROABS 9.4*  --   HGB 10.4* 8.8*  HCT 32.9* 27.4*  MCV 92.2 90.7  PLT 264 540   Basic Metabolic Panel:  Recent Labs Lab 04/14/16 1616 04/15/16 0235  NA 137 137  K 4.5 4.3  CL 103 105  CO2 24 23  GLUCOSE 122* 116*  BUN 30* 24*  CREATININE 1.14 1.13  CALCIUM 9.8 8.9   GFR: Estimated Creatinine Clearance: 53.8 mL/min (by C-G formula based on SCr of 1.13 mg/dL). Liver Function Tests:  Recent Labs Lab 04/14/16 1616  AST 30  ALT 25  ALKPHOS 124  BILITOT 0.3  PROT 7.5  ALBUMIN 3.0*   No results for input(s): LIPASE, AMYLASE in the last 168 hours. No results for input(s): AMMONIA in the last 168 hours. Coagulation Profile:  Recent Labs Lab 04/15/16 0235  INR 1.27   Cardiac Enzymes: No results for input(s): CKTOTAL, CKMB, CKMBINDEX, TROPONINI in the  last 168 hours. BNP (last 3 results) No results for input(s): PROBNP in the last 8760 hours. HbA1C: No results for input(s): HGBA1C in the last 72 hours. CBG: No results for input(s): GLUCAP in the last 168 hours. Lipid Profile: No results for input(s): CHOL, HDL, LDLCALC, TRIG, CHOLHDL, LDLDIRECT in the last 72 hours. Thyroid Function Tests: No results for input(s): TSH, T4TOTAL, FREET4, T3FREE, THYROIDAB in the last 72 hours. Anemia Panel: No results for input(s): VITAMINB12, FOLATE, FERRITIN, TIBC, IRON, RETICCTPCT in the last 72 hours.  Sepsis Labs:  Recent Labs Lab 04/14/16 1640 04/14/16 2015  LATICACIDVEN 1.61 2.55*    Recent Results (from the past 240 hour(s))  Blood Culture (routine x 2)     Status: None (Preliminary result)   Collection Time: 04/14/16  4:24 PM  Result Value Ref Range Status   Specimen Description BLOOD RIGHT ANTECUBITAL  Final   Special Requests BOTTLES DRAWN AEROBIC AND ANAEROBIC 5CC  Final   Culture NO GROWTH < 24 HOURS  Final   Report Status PENDING  Incomplete  Urine culture     Status: None   Collection  Time: 04/14/16  4:31 PM  Result Value Ref Range Status   Specimen Description URINE, CLEAN CATCH  Final   Special Requests NONE  Final   Culture NO GROWTH  Final   Report Status 04/15/2016 FINAL  Final  Blood Culture (routine x 2)     Status: None (Preliminary result)   Collection Time: 04/14/16  8:02 PM  Result Value Ref Range Status   Specimen Description BLOOD LEFT HAND  Final   Special Requests BOTTLES DRAWN AEROBIC ONLY 5CC  Final   Culture NO GROWTH < 12 HOURS  Final   Report Status PENDING  Incomplete  MRSA PCR Screening     Status: None   Collection Time: 04/14/16 11:54 PM  Result Value Ref Range Status   MRSA by PCR NEGATIVE NEGATIVE Final    Comment:        The GeneXpert MRSA Assay (FDA approved for NASAL specimens only), is one component of a comprehensive MRSA colonization surveillance program. It is not intended to  diagnose MRSA infection nor to guide or monitor treatment for MRSA infections.          Radiology Studies: Dg Chest 2 View  Result Date: 04/14/2016 CLINICAL DATA:  Shortness of breath . weakness . EXAM: CHEST  2 VIEW COMPARISON:  03/31/2016 . FINDINGS: Pleural drainage catheter left in stable position. Stable left pleural effusion. No pneumothorax. Assistant left upper lobe density is unchanged. New onset right lower lobe infiltrate. Stable cardiomegaly no acute bony abnormality identified. IMPRESSION: 1. Pleural drainage catheter left in stable position. Stable left pleural effusion no pneumothorax. Previously noted left upper lobe density is unchanged. 2.  New onset right lower lobe infiltrate consistent with pneumonia. Electronically Signed   By: Marcello Moores  Register   On: 04/14/2016 14:06   Ct Angio Chest Pe W Or Wo Contrast  Result Date: 04/14/2016 CLINICAL DATA:  SOB. Hx radiation therapy, chemotherapy, HTN, lung cancer, and chest tube placement. 60 mls Isovue 370 IV. EXAM: CT ANGIOGRAPHY CHEST WITH CONTRAST TECHNIQUE: Multidetector CT imaging of the chest was performed using the standard protocol during bolus administration of intravenous contrast. Multiplanar CT image reconstructions and MIPs were obtained to evaluate the vascular anatomy. CONTRAST:  100 cc Isovue 370 COMPARISON:  02/19/2016 FINDINGS: Despite efforts by the technologist and patient, motion artifact is present on today's exam and could not be eliminated. This reduces exam sensitivity and specificity. Cardiovascular: Suboptimal contrast timing, with contrast of fecal density in the pulmonary arteries and systemic arterial supply. In combination with the motion artifact this results in reduced diagnostic sensitivity and specificity. I am doubtful that this justified is another contrast dose and rescanning. No filling defect is identified in the pulmonary arterial tree to suggest pulmonary embolus. Coronary, aortic arch, and branch  vessel atherosclerotic vascular disease. No acute aortic findings are identified in the thorax. Mediastinum/Nodes: Right upper paratracheal node 1.2 cm in short axis on image 15/4, formerly 0.9 cm. Indistinct right hilar node approximately 1.0 cm in short axis on image 39/4. Subcarinal node 1.6 cm in short axis on image 46/4, previously 1.2 cm. There is progressive tumor along the left mediastinal border. Tumor in the pericardial space measures 2.3 cm in thickness on image 66/4, previously only about 0.7 cm at this location. Lungs/Pleura: Extensive pleural rind of tumor on the left. Small to moderate left pleural effusion with loculated hydropneumothorax, left pleural drain in place. The pleural rind is irregular and multinodular, with some of the atelectatic left lower lobe tethered peripherally.  The pleural rind is much worsened compared to 02/19/16 on the left. There is also some soft tissue density pleural thickening on the right measuring up to 7 mm in thickness on image 24/4. Patchy airspace opacities are present throughout the right lung, especially in the right lower lobe. On the left side the appearance is more characteristic of tethered atelectatic lung in the upper in lower lobes paraseptal emphysema noted. Upper Abdomen: New and enlarging hypodense lesions in the right hepatic lobe and lateral segment left hepatic lobe, compatible with metastatic tumor. Musculoskeletal: Thoracic spondylosis. Review of the MIP images confirms the above findings. IMPRESSION: 1. No embolus identified, reduced sensitivity due to suboptimal contrast timing and motion artifact. 2. Extensive progression of malignancy with thick irregular pleural rind of tumor on the left with tethering atelectasis, and new and enlarging hepatic metastatic lesions. 3. New airspace opacities throughout most of the right lung but especially favoring the right lower lobe and right middle lobe. Possibilities might include multilobar pneumonia,  pulmonary hemorrhage, or asymmetric edema. 4. Increasing mediastinal adenopathy. 5. Coronary, aortic arch, and branch vessel atherosclerotic vascular disease. Electronically Signed   By: Van Clines M.D.   On: 04/14/2016 19:59        Scheduled Meds: . aspirin EC  81 mg Oral QHS  . ceFEPime (MAXIPIME) IV  1 g Intravenous Q12H  . enoxaparin (LOVENOX) injection  40 mg Subcutaneous QHS  . guaiFENesin  600 mg Oral BID  . loratadine  10 mg Oral Daily  . LORazepam  0.5 mg Intravenous BID  . mouth rinse  15 mL Mouth Rinse BID  . sodium chloride flush  3 mL Intravenous Q12H  . vancomycin  750 mg Intravenous Q12H   Continuous Infusions:    LOS: 1 day    Time spent: 60 minutes.    [facility], MD [facility] Pager [phone]  If 7PM-7AM, please contact night-coverage [email or web address] Password [email or web address] 04/15/2016, 4:16 PM

## 2016-04-15 NOTE — Progress Notes (Signed)
Patient asking for something for his nerves, paged palliative, paged dr. Algis Liming, orders to give 1 mg ativan. Will continue to monitor closely.

## 2016-04-16 DIAGNOSIS — C799 Secondary malignant neoplasm of unspecified site: Secondary | ICD-10-CM | POA: Diagnosis present

## 2016-04-16 DIAGNOSIS — C801 Malignant (primary) neoplasm, unspecified: Secondary | ICD-10-CM

## 2016-04-16 DIAGNOSIS — R0600 Dyspnea, unspecified: Secondary | ICD-10-CM

## 2016-04-16 MED ORDER — GLYCOPYRROLATE 0.2 MG/ML IJ SOLN
0.2000 mg | INTRAMUSCULAR | Status: DC | PRN
  Filled 2016-04-16: qty 1

## 2016-04-16 MED ORDER — LORAZEPAM 2 MG/ML IJ SOLN: 0.5000 mg | Freq: Two times a day (BID) | INTRAMUSCULAR | Status: AC

## 2016-04-16 MED ORDER — FENTANYL CITRATE (PF) 100 MCG/2ML IJ SOLN: 25.0000 ug | INTRAMUSCULAR | Status: AC | PRN

## 2016-04-16 MED ORDER — LORAZEPAM 2 MG/ML IJ SOLN: 1.0000 mg | INTRAMUSCULAR | Status: AC | PRN

## 2016-04-16 MED ORDER — GLYCOPYRROLATE 0.2 MG/ML IJ SOLN: 0.2000 mg | INTRAMUSCULAR | Status: AC | PRN

## 2016-04-16 MED ORDER — FENTANYL CITRATE (PF) 100 MCG/2ML IJ SOLN
25.0000 ug | INTRAMUSCULAR | Status: DC | PRN
  Administered 2016-04-16 (×2): 50 ug via INTRAVENOUS
  Administered 2016-04-16: 25 ug via INTRAVENOUS
  Administered 2016-04-17 (×4): 50 ug via INTRAVENOUS
  Filled 2016-04-16 (×7): qty 2

## 2016-04-16 NOTE — Discharge Summary (Addendum)
Physician Discharge Summary  Travis Medina EHM:[id number] DOB: 09-26-1933  PCP: [name], MD  Admit date: 04/14/2016 Discharge date: 04/17/2016  Admitted From: Home Disposition:  [place] for [facility] of [place].  Recommendations for Outpatient Follow-up:  1. Hospice MD to follow at [facility]. 2. Continue oxygen titrated to comfort. 3. Patient to discharge on peripheral IV.   Home Health: N/A Equipment/Devices: N/A  Discharge Condition: Rapidly declining and prognosis less than few days.  CODE STATUS: DO NOT RESUSCITATE  Diet recommendation: Comfort feeds of choice.  Discharge Diagnoses:  Principal Problem:   Metastatic cancer ([place]) Active Problems:   HCAP (healthcare-associated pneumonia)   PNA (pneumonia)   Respiratory failure with hypoxia ([place])   Goals of care, counseling/discussion   Palliative care encounter   Dyspnea   Anxiety state   Brief Narrative:  82 year old male with a PMH of limited stage small cell lung cancer status post systemic chemotherapy with carboplatin and etoposide status post 4 cycles concurrent with radiation and almost complete response followed by whole brain radiation, last seen by his oncologist on 04/06/16, being observed in next follow-up appointment in November 2017, left Pleurx catheter placed in July 2017 for recurrent left pleural effusion, HTN, baseline chronic dyspnea, presented to [facility] ED on 04/14/16 with complaints of worsening dyspnea at rest and with minimal exertion, low-grade fever, dry cough, no chest pain and decreased drainage through left Pleurx. CTA chest showed advanced/metastatic lung cancer to liver and possible RML, RLL pneumonia versus lymphangitic cancer spread. Admitted to stepdown unit for acute hypoxic respiratory failure secondary to metastatic lung cancer and possible pneumonia. Extensively discussed multiple times with patient and spouse. Coordinated care with TCTS, Oncology and Palliative Care. Patient eventually  opted for comfort oriented care & DO NOT RESUSCITATE and no aggressive measures.   Assessment & Plan:   Extensive/metastatic small cell Lung carcinoma - Main reason for patient's respiratory decline - After extensive discussion, patient and family have opted for DO NOT RESUSCITATE and end of life full comfort care. Oncology input much appreciated. - Discussed with palliative care team for assisting with symptom management and discharged to [facility] on 04/17/16.  RML & RLL HCAP versus lymphangitic spread of cancer - Continued IV antibiotics until discharge then discontinue.  Acute respiratory failure with hypoxia - Secondary to advanced lung cancer and possible pneumonia. DO NOT RESUSCITATE. Supportive care. - As discussed with TCTS, not much pleural effusion to drain and not likely the cause of his decompensation. - Did not use BiPAP overnight. Remains on 100% nonrebreather mask. IV fentanyl titrated to comfort.  Anemia - likely d/t chronic disease  Essential hypertension - Controlled  Adult failure to thrive - Secondary to metastatic malignancy and advanced age.  Palliative care encounter - On 04/15/16, I met with patient and family multiple times and had extensive discussions, went over current diagnosis, treatment options, prognosis, consultation with multiple specialists and eventually patient opted for comfort oriented care. - Met with patient's spouse, daughter and son and discussed in detail. Please see additional progress note regarding discussion.   DO NOT RESUSCITATE   Consultants:   TCTS  Palliative care team  Medical Oncology  Procedures:   None   Discharge Instructions  Discharge Instructions    Call MD for:  difficulty breathing, headache or visual disturbances    Complete by:  As directed   Call MD for:  extreme fatigue    Complete by:  As directed   Call MD for:  persistant dizziness  or light-headedness    Complete by:  As directed    Call MD for:  persistant nausea and vomiting    Complete by:  As directed   Call MD for:  severe uncontrolled pain    Complete by:  As directed   Call MD for:  temperature >100.4    Complete by:  As directed   Diet - low sodium heart healthy    Complete by:  As directed   Discharge instructions    Complete by:  As directed   1) Diet: Comfort feeds of choice. 2) Titrate Oxygen to comfort.   Increase activity slowly    Complete by:  As directed       Medication List    STOP taking these medications   acetaminophen 500 MG tablet Commonly known as:  TYLENOL   alendronate 70 MG tablet Commonly known as:  FOSAMAX   aspirin EC 81 MG tablet   guaiFENesin 600 MG 12 hr tablet Commonly known as:  MUCINEX   lisinopril 10 MG tablet Commonly known as:  PRINIVIL,ZESTRIL   loratadine 10 MG tablet Commonly known as:  CLARITIN   traMADol 50 MG tablet Commonly known as:  ULTRAM     TAKE these medications   fentaNYL 100 MCG/2ML injection Commonly known as:  SUBLIMAZE Inject 0.5-1 mLs (25-50 mcg total) into the vein every 15 (fifteen) minutes as needed for moderate pain or severe pain (dyspnea.).   glycopyrrolate 0.2 MG/ML injection Commonly known as:  ROBINUL Inject 1 mL (0.2 mg total) into the vein every 4 (four) hours as needed (secretions).   LORazepam 2 MG/ML injection Commonly known as:  ATIVAN Inject 0.25 mLs (0.5 mg total) into the vein 2 (two) times daily.   LORazepam 2 MG/ML injection Commonly known as:  ATIVAN Inject 0.5 mLs (1 mg total) into the vein every 4 (four) hours as needed for anxiety.       Allergies  Allergen Reactions  . Morphine And Related Itching and Rash    Procedures/Studies: Dg Chest 2 View  Result Date: 04/14/2016 CLINICAL DATA:  Shortness of breath . weakness . EXAM: CHEST  2 VIEW COMPARISON:  03/31/2016 . FINDINGS: Pleural drainage catheter left in stable position. Stable left pleural effusion. No pneumothorax. Assistant left upper lobe density  is unchanged. New onset right lower lobe infiltrate. Stable cardiomegaly no acute bony abnormality identified. IMPRESSION: 1. Pleural drainage catheter left in stable position. Stable left pleural effusion no pneumothorax. Previously noted left upper lobe density is unchanged. 2.  New onset right lower lobe infiltrate consistent with pneumonia. Electronically Signed   By: Marcello Moores  Register   On: 04/14/2016 14:06   Dg Chest 2 View  Result Date: 03/31/2016 CLINICAL DATA:  History of left lung carcinoma, followup EXAM: CHEST  2 VIEW COMPARISON:  Chest x-ray of 03/15/2016 and CT chest of 02/19/2016 FINDINGS: There is little change in the left pleural effusion which may be loculated posteriorly with possible left pleural thickening and volume loss at the left base. Abnormal opacity in the left medial upper lung field is unchanged as well. This area was noted on CT from 02/19/2016 and may represent either recurrence of carcinoma or second primary. The right lung is clear. Heart size is stable. There are degenerative changes in the mid to lower thoracic spine. IMPRESSION: 1. Persistent left basilar opacity most consistent with loculated pleural effusion and pleural thickening with volume loss. 2. No change in the medial left upper lobe opacity adjacent to the aortic  knob. This is suspicious for either recurrence of carcinoma or a second primary lesion. Electronically Signed   By: Ivar Drape M.D.   On: 03/31/2016 15:13   Ct Angio Chest Pe W Or Wo Contrast  Result Date: 04/14/2016 CLINICAL DATA:  SOB. Hx radiation therapy, chemotherapy, HTN, lung cancer, and chest tube placement. 60 mls Isovue 370 IV. EXAM: CT ANGIOGRAPHY CHEST WITH CONTRAST TECHNIQUE: Multidetector CT imaging of the chest was performed using the standard protocol during bolus administration of intravenous contrast. Multiplanar CT image reconstructions and MIPs were obtained to evaluate the vascular anatomy. CONTRAST:  100 cc Isovue 370 COMPARISON:   02/19/2016 FINDINGS: Despite efforts by the technologist and patient, motion artifact is present on today's exam and could not be eliminated. This reduces exam sensitivity and specificity. Cardiovascular: Suboptimal contrast timing, with contrast of fecal density in the pulmonary arteries and systemic arterial supply. In combination with the motion artifact this results in reduced diagnostic sensitivity and specificity. I am doubtful that this justified is another contrast dose and rescanning. No filling defect is identified in the pulmonary arterial tree to suggest pulmonary embolus. Coronary, aortic arch, and branch vessel atherosclerotic vascular disease. No acute aortic findings are identified in the thorax. Mediastinum/Nodes: Right upper paratracheal node 1.2 cm in short axis on image 15/4, formerly 0.9 cm. Indistinct right hilar node approximately 1.0 cm in short axis on image 39/4. Subcarinal node 1.6 cm in short axis on image 46/4, previously 1.2 cm. There is progressive tumor along the left mediastinal border. Tumor in the pericardial space measures 2.3 cm in thickness on image 66/4, previously only about 0.7 cm at this location. Lungs/Pleura: Extensive pleural rind of tumor on the left. Small to moderate left pleural effusion with loculated hydropneumothorax, left pleural drain in place. The pleural rind is irregular and multinodular, with some of the atelectatic left lower lobe tethered peripherally. The pleural rind is much worsened compared to 02/19/16 on the left. There is also some soft tissue density pleural thickening on the right measuring up to 7 mm in thickness on image 24/4. Patchy airspace opacities are present throughout the right lung, especially in the right lower lobe. On the left side the appearance is more characteristic of tethered atelectatic lung in the upper in lower lobes paraseptal emphysema noted. Upper Abdomen: New and enlarging hypodense lesions in the right hepatic lobe and  lateral segment left hepatic lobe, compatible with metastatic tumor. Musculoskeletal: Thoracic spondylosis. Review of the MIP images confirms the above findings. IMPRESSION: 1. No embolus identified, reduced sensitivity due to suboptimal contrast timing and motion artifact. 2. Extensive progression of malignancy with thick irregular pleural rind of tumor on the left with tethering atelectasis, and new and enlarging hepatic metastatic lesions. 3. New airspace opacities throughout most of the right lung but especially favoring the right lower lobe and right middle lobe. Possibilities might include multilobar pneumonia, pulmonary hemorrhage, or asymmetric edema. 4. Increasing mediastinal adenopathy. 5. Coronary, aortic arch, and branch vessel atherosclerotic vascular disease. Electronically Signed   By: Van Clines M.D.   On: 04/14/2016 19:59      Subjective: Patient states that he is comfortable. No complaints voiced.  Discharge Exam:  Vitals:   04/16/16 0225 04/16/16 0400 04/16/16 0820 04/16/16 0845  BP: 128/78 (!) 123/52  (!) 105/53  Pulse: 95 61 75 95  Resp: (!) 40 16 (!) 27 (!) 33  Temp:  99.3 F (37.4 C) 98 F (36.7 C)   TempSrc:  Oral Oral  SpO2: 100% 99% 100% 99%  Weight:      Height:        General exam: Pleasant elderly male, ill-looking, lying propped up in bed with intermittent tachypnea especially with talking or minimal activity. Has a nonrebreather mask on. Lethargic but easily arousable. Respiratory system: decreased breath sounds bilaterally, left greater than signed right. Mild intermittent increased work of breathing. Left Pleurx site clean and dry. Cardiovascular system: S1 & S2 heard, RRR. No JVD, murmurs, rubs, gallops or clicks. No pedal edema. Telemetry: Sinus rhythm. Gastrointestinal system: Abdomen is nondistended, soft and nontender. No organomegaly or masses felt. Normal bowel sounds heard. Central nervous system:  lethargic but easily arousable and  oriented. No focal neurological deficits. Extremities: Symmetric 5 x 5 power. Skin: No rashes, lesions or ulcers Psychiatry: Judgement and insight appear normal. Mood & affect appropriate.     The results of significant diagnostics from this hospitalization (including imaging, microbiology, ancillary and laboratory) are listed below for reference.     Microbiology: Recent Results (from the past 240 hour(s))  Blood Culture (routine x 2)     Status: None (Preliminary result)   Collection Time: 04/14/16  4:24 PM  Result Value Ref Range Status   Specimen Description BLOOD RIGHT ANTECUBITAL  Final   Special Requests BOTTLES DRAWN AEROBIC AND ANAEROBIC 5CC  Final   Culture NO GROWTH 2 DAYS  Final   Report Status PENDING  Incomplete  Urine culture     Status: None   Collection Time: 04/14/16  4:31 PM  Result Value Ref Range Status   Specimen Description URINE, CLEAN CATCH  Final   Special Requests NONE  Final   Culture NO GROWTH  Final   Report Status 04/15/2016 FINAL  Final  Blood Culture (routine x 2)     Status: None (Preliminary result)   Collection Time: 04/14/16  8:02 PM  Result Value Ref Range Status   Specimen Description BLOOD LEFT HAND  Final   Special Requests BOTTLES DRAWN AEROBIC ONLY 5CC  Final   Culture NO GROWTH 2 DAYS  Final   Report Status PENDING  Incomplete  MRSA PCR Screening     Status: None   Collection Time: 04/14/16 11:54 PM  Result Value Ref Range Status   MRSA by PCR NEGATIVE NEGATIVE Final    Comment:        The GeneXpert MRSA Assay (FDA approved for NASAL specimens only), is one component of a comprehensive MRSA colonization surveillance program. It is not intended to diagnose MRSA infection nor to guide or monitor treatment for MRSA infections.      Labs: BNP (last 3 results)  Recent Labs  04/14/16 1616  BNP 29.9   Basic Metabolic Panel:  Recent Labs Lab 04/14/16 1616 04/15/16 0235  NA 137 137  K 4.5 4.3  CL 103 105  CO2 24 23   GLUCOSE 122* 116*  BUN 30* 24*  CREATININE 1.14 1.13  CALCIUM 9.8 8.9   Liver Function Tests:  Recent Labs Lab 04/14/16 1616  AST 30  ALT 25  ALKPHOS 124  BILITOT 0.3  PROT 7.5  ALBUMIN 3.0*   No results for input(s): LIPASE, AMYLASE in the last 168 hours. No results for input(s): AMMONIA in the last 168 hours. CBC:  Recent Labs Lab 04/14/16 1616 04/15/16 0235  WBC 11.6* 12.4*  NEUTROABS 9.4*  --   HGB 10.4* 8.8*  HCT 32.9* 27.4*  MCV 92.2 90.7  PLT 264 245   Urinalysis  Component Value Date/Time   COLORURINE YELLOW 04/14/2016 1631   APPEARANCEUR CLEAR 04/14/2016 1631   LABSPEC 1.014 04/14/2016 1631   PHURINE 5.5 04/14/2016 1631   GLUCOSEU NEGATIVE 04/14/2016 1631   HGBUR NEGATIVE 04/14/2016 1631   BILIRUBINUR NEGATIVE 04/14/2016 1631   KETONESUR NEGATIVE 04/14/2016 1631   PROTEINUR NEGATIVE 04/14/2016 1631   UROBILINOGEN 0.2 10/24/2014 0509   NITRITE NEGATIVE 04/14/2016 1631   LEUKOCYTESUR NEGATIVE 04/14/2016 1631     Time coordinating discharge: Over 30 minutes  SIGNED:  Vernell Leep, MD, FACP, [place]. [facility] Pager [phone]  If 7PM-7AM, please contact night-coverage [email or web address] Password [email or web address] 04/16/2016, 11:45 AM

## 2016-04-16 NOTE — Progress Notes (Addendum)
Daily Progress Note   Patient Name: Travis Medina       Date: 04/16/2016 DOB: [birth year]  Age: 82 y.o. MRN#: [id number] Attending Physician: Modena Jansky, MD Primary Care Physician: [name], MD Admit Date: 04/14/2016  Reason for Consultation/Follow-up: Establishing goals of care, Inpatient hospice referral, Non pain symptom management, Pain control, Psychosocial/spiritual support and Terminal Care  Subjective: Patient did not use BiPAP overnight but is on 100% nonrebreather. His wife son and sister-in-law are at the bedside. They inform me that he has taken a bite or 2 of breakfast, sips of water and has had periods of being awake and talking to them which is what they have been hoping for. They are all quite tearful about having to say goodbye to each other. His wife spoke to me that it is her  desire to go to [facility] for end-of-life care.  Length of Stay: 2  Current Medications: Scheduled Meds:  . ceFEPime (MAXIPIME) IV  1 g Intravenous Q12H  . LORazepam  0.5 mg Intravenous BID  . mouth rinse  15 mL Mouth Rinse BID  . sodium chloride flush  3 mL Intravenous Q12H  . vancomycin  750 mg Intravenous Q12H    Continuous Infusions:    PRN Meds: acetaminophen, diphenhydrAMINE, fentaNYL (SUBLIMAZE) injection, glycopyrrolate, LORazepam  Physical Exam  Constitutional: He appears well-developed and well-nourished.  HENT:  Head: Normocephalic and atraumatic.  Cardiovascular:  Irrg, run of nonsustained vtach  Pulmonary/Chest:  100% NRB currently. No Bipap over night Mild increased work of breathing at rest  Genitourinary:  Genitourinary Comments: foley  Neurological:  somnalent  Skin: Skin is warm and dry.  Psychiatric: His behavior is normal.  Nursing note and vitals  reviewed.           Vital Signs: BP (!) 105/53   Pulse 95   Temp 98 F (36.7 C) (Oral)   Resp (!) 33   Ht '5\' 10"'$  (1.778 m)   Wt 78.9 kg (173 lb 15.1 oz)   SpO2 99%   BMI 24.96 kg/m  SpO2: SpO2: 99 % O2 Device: O2 Device: NRB O2 Flow Rate: O2 Flow Rate (L/min): 10 L/min  Intake/output summary:  Intake/Output Summary (Last 24 hours) at 04/16/16 1040 Last data filed at 04/16/16 0600  Gross per 24 hour  Intake  1003 ml  Output             2265 ml  Net            -1262 ml   LBM: Last BM Date:  (PTA ) Baseline Weight: Weight: 78 kg (172 lb) Most recent weight: Weight: 78.9 kg (173 lb 15.1 oz)       Palliative Assessment/Data:    Flowsheet Rows   Flowsheet Row Most Recent Value  Intake Tab  Referral Department  Hospitalist  Unit at Time of Referral  ICU  Palliative Care Primary Diagnosis  Pulmonary  Date Notified  04/15/16  Palliative Care Type  New Palliative care  Reason for referral  Clarify Goals of Care  Date of Admission  04/14/16  Date first seen by Palliative Care  04/15/16  # of days Palliative referral response time  0 Day(s)  # of days IP prior to Palliative referral  1  Clinical Assessment  Palliative Performance Scale Score  30%  Dyspnea Max Last 24 Hours  9  Dyspnea Min Last 24 hours  5  Anxiety Max Last 24 Hours  5  Anxiety Min Last 24 Hours  3  Psychosocial & Spiritual Assessment  Palliative Care Outcomes  Patient/Family meeting held?  Yes  Who was at the meeting?  patient, wife, sister, brother in law  Palliative Care Outcomes  Improved non-pain symptom therapy, Clarified goals of care  Patient/Family wishes: Interventions discontinued/not started   Mechanical Ventilation, Tube feedings/TPN  Palliative Care follow-up planned  No      Patient Active Problem List   Diagnosis Date Noted  . Goals of care, counseling/discussion   . Palliative care encounter   . Dyspnea   . Anxiety state   . HCAP (healthcare-associated pneumonia)  04/14/2016  . PNA (pneumonia) 04/14/2016  . Respiratory failure with hypoxia ([place]) 04/14/2016  . Pneumonia 03/06/2016  . Pleural effusion 02/19/2016  . Hyponatremia 02/19/2016  . CKD (chronic kidney disease) 02/19/2016  . Elevated bilirubin 01/02/2015  . Antineoplastic chemotherapy induced anemia 01/02/2015  . Primary cancer of left upper lobe of lung ([place]) 11/20/2014  . Lung mass 11/14/2014  . Essential hypertension 10/23/2014  . Pathologic fracture of femur ([place]) 10/23/2014    Palliative Care Assessment & Plan   Patient Profile:  82 year old male with a PMH of limited stage small cell lung cancer status post systemic chemotherapy with carboplatin and etoposide status post 4 cycles concurrent with radiation and almost complete response followed by whole brain radiation, last seen by his oncologist on 04/06/16, being observed in next follow-up appointment in November 2017, left Pleurx catheter placed in July 2017 for recurrent left pleural effusion, HTN, baseline chronic dyspnea, presented to [facility] ED on 04/14/16 with complaints of worsening dyspnea at rest and with minimal exertion, low-grade fever, dry cough, no chest pain and decreased drainage through left Pleurx. CTA chest showed advanced/metastatic lung cancer to liver and possible RML, RLL pneumonia versus lymphangitic cancer spread. Admitted to stepdown unit for acute hypoxic respiratory failure secondary to metastatic lung cancer and possible pneumonia. Extensively discussed multiple times with patient and spouse. Coordinated care with TCTS, Oncology and Palliative Care. Patient eventually opted for comfort oriented care & DO NOT RESUSCITATE and no aggressive measures.    Assessment: Patient is wearing 100% nonrebreather; he is currently somnolent. Per family Ativan has been very effective in helping his respiratory status with associated anxiety  Recommendations/Plan:  Continue fentanyl but will increase range to 25-50 g every 15  minutes for acute dyspnea. Patient is listed as having an allergy to morphine and related products; rash  Continue scheduled Ativan 0.5 mg twice daily and 1 mg every 4 when necessary for anxiety associated with dyspnea  We'll add Robinul 0.2 mg every 4 hours as needed for secretions; monitor for need for scheduled dosing  Continue antibiotics until discharge. Family understands that inpatient hospice will not provide IV fluids or IV antibiotics  Family is hopeful for discharge to inpatient hospice today. At this point I do believe that he can make the trip. His wife does understand how fragile he is. I have called social work as well as on Art therapist with hospice and palliative care of [facility] to ascertain bed availability  Please leave patient on to heart until we can glean whether he can transfer today to hospice inpatient. If he cannot we'll approach family with transfer off to heart to perhaps the [facility] for end-of-life care  Goals of Care and Additional Recommendations:  Limitations on Scope of Treatment: Avoid Hospitalization, Minimize Medications, No Artificial Feeding, No Blood Transfusions, No Chemotherapy, No Diagnostics, No Hemodialysis, No Lab Draws, No Radiation, No Surgical Procedures and No Tracheostomy  Code Status:    Code Status Orders        Start     Ordered   04/15/16 0958  Do not attempt resuscitation (DNR)  Continuous    Question Answer Comment  In the event of cardiac or respiratory ARREST Do not call a "code blue"   In the event of cardiac or respiratory ARREST Do not perform Intubation, CPR, defibrillation or ACLS   In the event of cardiac or respiratory ARREST Use medication by any route, position, wound care, and other measures to relive pain and suffering. May use oxygen, suction and manual treatment of airway obstruction as needed for comfort.      04/15/16 0957    Code Status History    Date Active Date Inactive Code Status Order ID Comments  User Context   04/14/2016 11:41 PM 04/15/2016  9:57 AM Full Code [id number]  Florencia Reasons, MD Inpatient   03/06/2016 12:34 PM 03/10/2016  3:03 PM Full Code [id number]  Ivin Poot, MD ED   02/19/2016  7:48 PM 02/22/2016  5:40 PM Full Code [id number]  Geradine Girt, DO Inpatient   11/19/2014 10:04 AM 11/20/2014  3:42 AM Full Code [id number]  Marybelle Killings, MD HOV   10/23/2014  9:32 PM 10/25/2014  3:58 PM Full Code [id number]  Lavina Hamman, MD Inpatient    Advance Directive Documentation   Flowsheet Row Most Recent Value  Type of Advance Directive  Healthcare Power of Attorney, Living will  Pre-existing out of facility DNR order (yellow form or pink MOST form)  No data  "MOST" Form in Place?  No data       Prognosis:   Hours - Days  Discharge Planning:  Hospice facility  Care plan was discussed with Dr. Algis Liming  Thank you for allowing the Palliative Medicine Team to assist in the care of this patient.   Time In: 1000 Time Out: 1025 Total Time 25 min Prolonged Time Billed  no       Greater than 50%  of this time was spent counseling and coordinating care related to the above assessment and plan.  Dory Horn, NP  Please contact Palliative Medicine Team phone at [phone] for questions and concerns.

## 2016-04-16 NOTE — Clinical Social Work Note (Addendum)
CSW has been in contact with [facility] at [facility] [phone]) today regarding placement. She stated that a bed would likely not be available until tonight or tomorrow morning. Paged MD. Discussed with patient's wife and adult children. Levada Dy will make contact with patient's wife.   Dayton Scrape, [place]  1:14 pm MD requested that [facility] call unit tonight if a bed comes available. [facility] able to do so. RN aware and will notify night nurse as well. Paged MD with this information.  Dayton Scrape, [place]

## 2016-04-16 NOTE — Progress Notes (Signed)
[place] 2H-09 [facility]  Confirmed with patient's wife Boston Cookson desire for [employer].  Will meet with Mrs. Pierre to complete consents just prior to transport.  Patient to receive first available bed.  Thank you, Moss Mc, RN, BSN, [facility] Director of [place] [phone]

## 2016-04-17 NOTE — Progress Notes (Signed)
PTAR at bedside. Patient report given. Transport report given and DNR paperwork supplied. Family at bedside. All questions answered. Report called and given to [facility].

## 2016-04-17 NOTE — Progress Notes (Signed)
[facility] notified of transfer.

## 2016-04-17 NOTE — Progress Notes (Signed)
[place] 2H-09 [facility]  Met with patient's wife Patrik Turnbaugh, son Zyan Coby, and daughter Burnetta Sabin.  Dr. Algis Liming also present to answer questions about disease process.  Hospice consents completed by wife and patient to transfer to [facility] this morning.  Thank you, Moss Mc, RN, BSN, [facility] Director of [place] [phone]

## 2016-04-17 NOTE — Progress Notes (Signed)
Progress note  Overnight events noted. Discussed with RN. Apparently slightly tachypnea this morning and medicated with appropriate response. Seen patient at bedside. Patient currently lethargic, somnolent and mildly tachypneic. Did not wake him up or examine. Except RR 24 bpm, other vital signs appeared stable.   Met and discussed with patient's family including spouse, son and daughter along with the representative from [facility]. Family, especially daughter had lots of questions. There seemed some denial, anger and grief related to patient's rapid decline. Daughter had apparently heard 2 weeks ago that "cancer was cured". Updated hospital course, imaging studies revealing advanced metastatic cancer, oncologist consultation attesting to same, prior discussions with patient and spouse during this admission, patient's choice of DO NOT RESUSCITATE and not wishing to pursue any further cancer treatment and opting for comfort care. All seemed satisfied with the discussion we had. They were comfortable with current decision of discharging patient to [facility] for comfort/EOL care. Prognosis may be hours to days.   Discussed with RN, palliative care team and [facility] representative.  Discharge to [facility].  Vernell Leep, MD, FACP, FHM. [facility] Pager [phone]  If 7PM-7AM, please contact night-coverage [email or web address] Password [email or web address] 04/17/2016, 12:59 PM

## 2016-04-19 LAB — CULTURE, BLOOD (ROUTINE X 2)
CULTURE: NO GROWTH
CULTURE: NO GROWTH

## 2016-04-26 ENCOUNTER — Encounter: Payer: Medicare Other | Admitting: Thoracic Surgery (Cardiothoracic Vascular Surgery)

## 2016-04-29 ENCOUNTER — Telehealth: Payer: Self-pay | Admitting: Medical Oncology

## 2016-04-29 NOTE — Telephone Encounter (Signed)
Daughter Burnetta Sabin called. She and her mom are looking for answers as to why Izael passed away. She reqeusts a consultation with [name] to talk to him about this so they understand. Note to [place].

## 2016-05-02 ENCOUNTER — Telehealth: Payer: Self-pay | Admitting: *Deleted

## 2016-05-02 NOTE — Telephone Encounter (Signed)
Call to Mrs jude naclerio MD can discuss wife and daughters concerns on Friday 9/29 at [place] wife call back to confirm date/time

## 2016-05-04 IMAGING — CR DG CHEST 1V
1 series · 1 of 1 positions shown · non-contrast
Comparison: 10/24/2014

CLINICAL DATA: Post biopsy

EXAM:
CHEST  1 VIEW

[w chest pa]
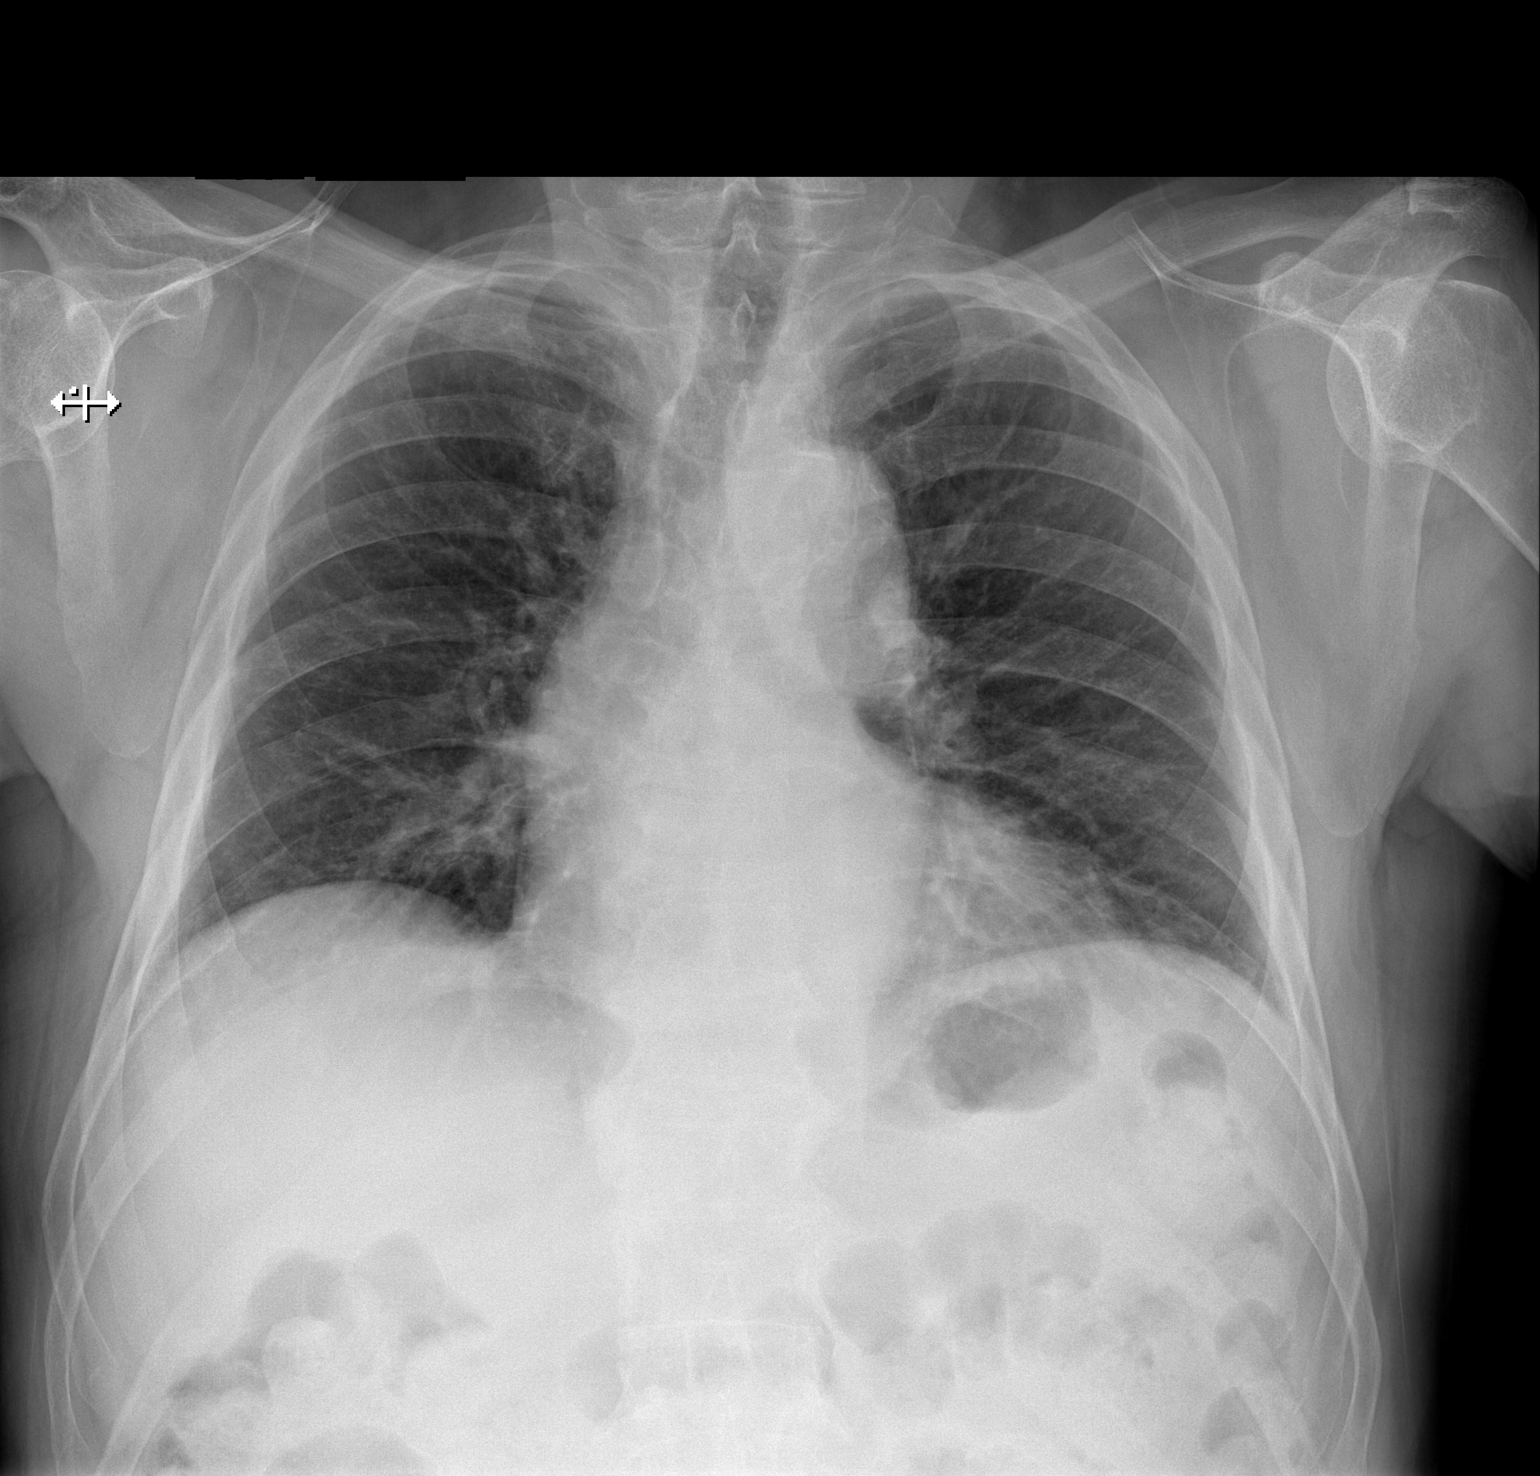

[1 of 1 positions shown; findings below may reference images not displayed]

FINDINGS: No pneumothorax. Low volumes. Bibasilar atelectasis. Normal heart
size.
IMPRESSION: No pneumothorax.

## 2016-05-08 DEATH — deceased

## 2016-05-25 NOTE — Telephone Encounter (Signed)
Error in charting.

## 2016-06-28 ENCOUNTER — Other Ambulatory Visit: Payer: Medicare Other

## 2016-06-29 IMAGING — CT CT ABD-PELV W/ CM
2 of 5 series · 15 of 46 positions shown, 17 images · IV contrast (omnipaque)
Comparison: PET-CT from 11/06/2014. Chest abdomen and pelvis CT
from 10/24/2014.

CLINICAL DATA: Subsequent encounter for lung cancer.

EXAM:
CT CHEST, ABDOMEN, AND PELVIS WITH CONTRAST
TECHNIQUE: Multidetector CT imaging of the chest, abdomen and pelvis was
performed following the standard protocol during bolus
administration of intravenous contrast.
CONTRAST:  100mL OMNIPAQUE IOHEXOL 300 MG/ML  SOLN

[Series 2: cap with st · axial · 0.87mm/px · z∈[-646,-56]mm · 12 of 134 slices shown, 14 images]
[im 8/134  soft-tissue]
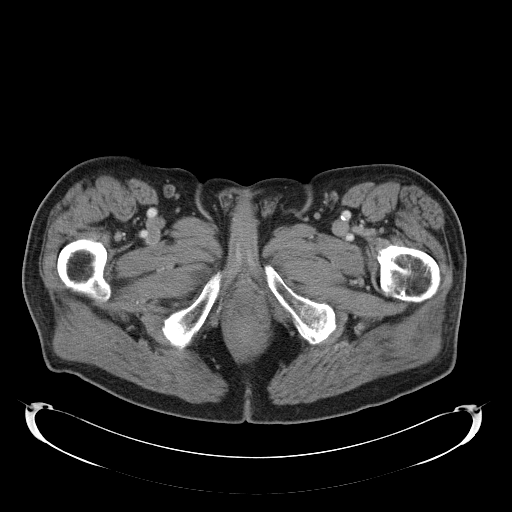
[im 8/134  bone]
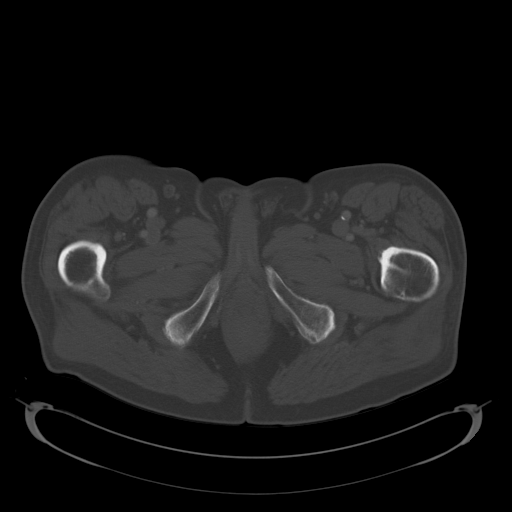
[im 23/134  soft-tissue]
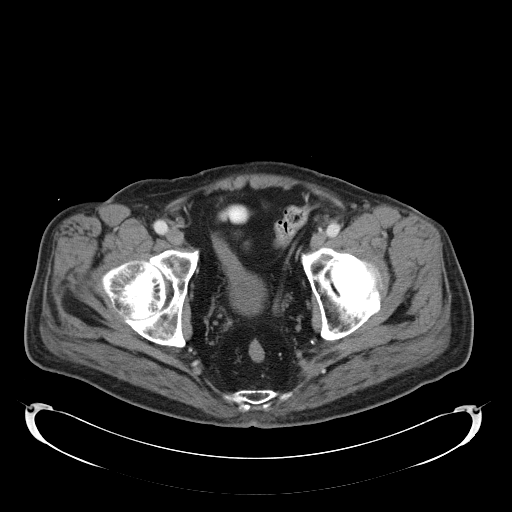
[im 30/134  soft-tissue]
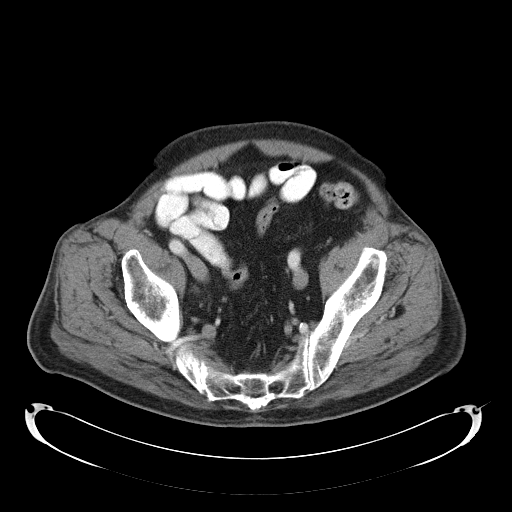
[im 37/134  soft-tissue]
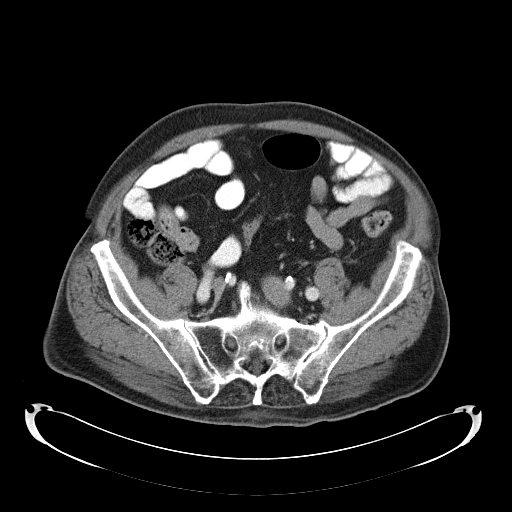
[im 52/134  soft-tissue]
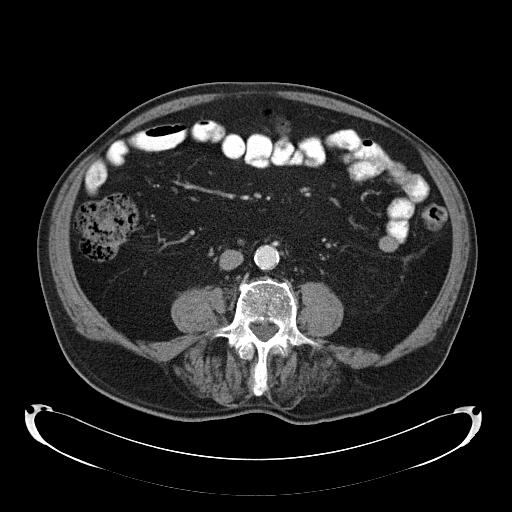
[im 60/134  soft-tissue]
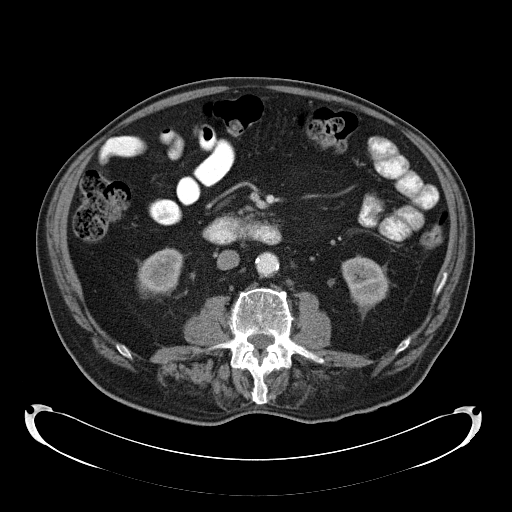
[im 74/134  soft-tissue]
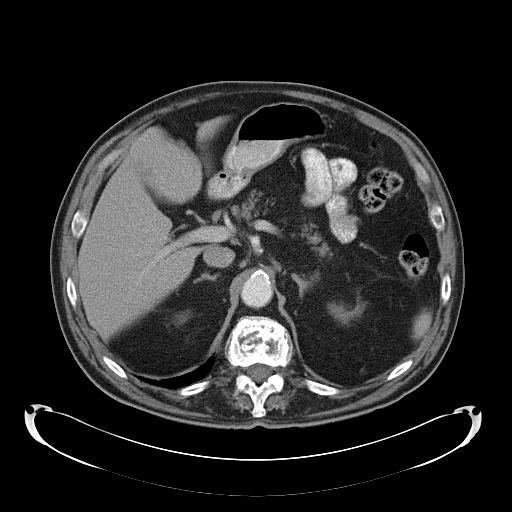
[im 82/134  soft-tissue]
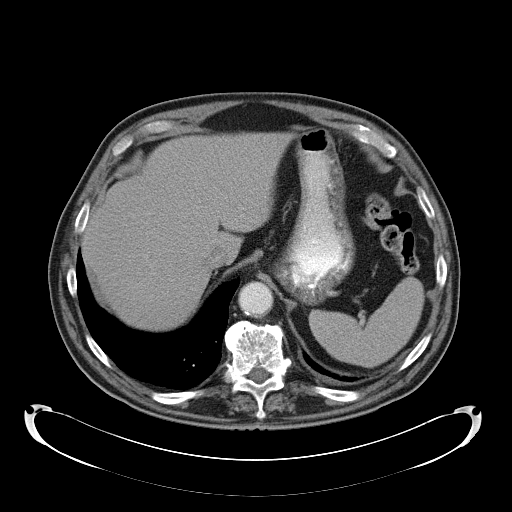
[im 97/134  soft-tissue]
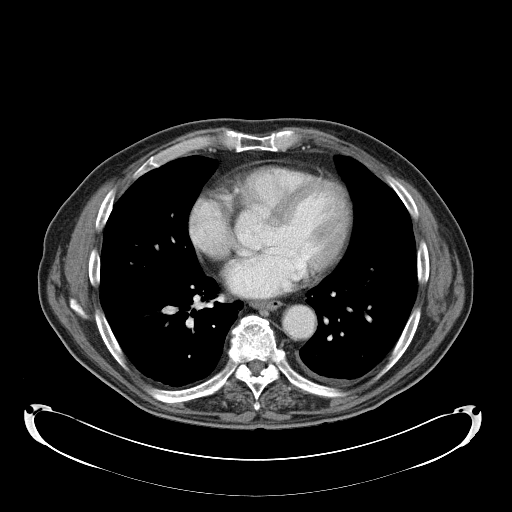
[im 97/134  bone]
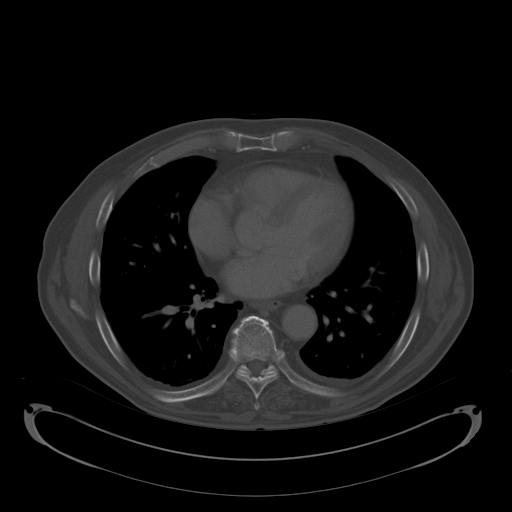
[im 104/134  soft-tissue]
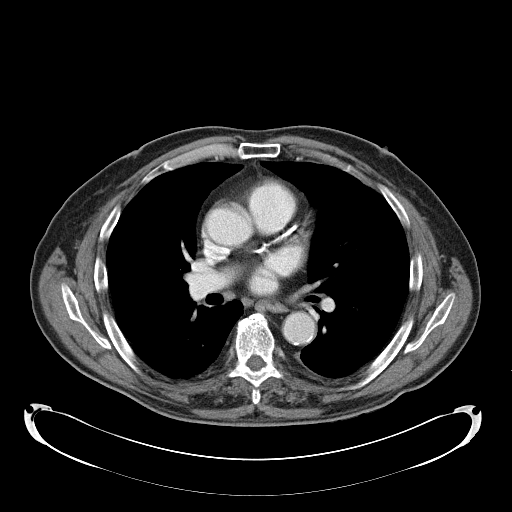
[im 111/134  soft-tissue]
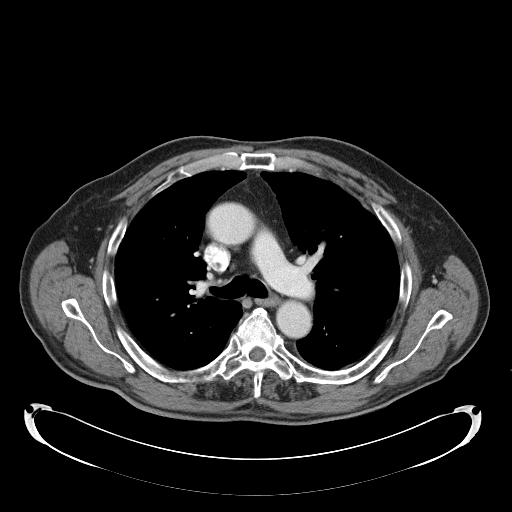
[im 126/134  soft-tissue]
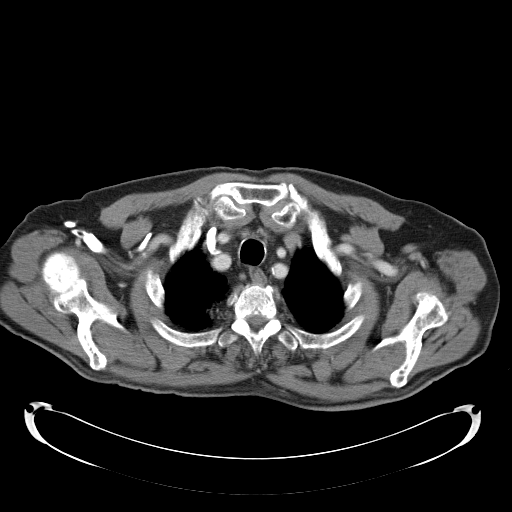

[Series 602: <mpr thick range> · coronal · 1.30mm/px · 3 of 107 slices shown]
[im 36/107  soft-tissue]
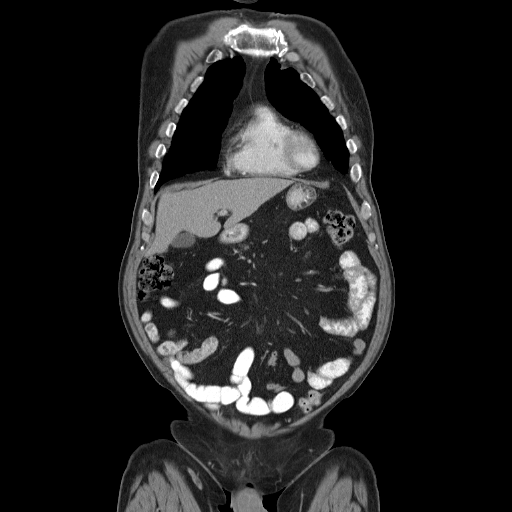
[im 48/107  soft-tissue]
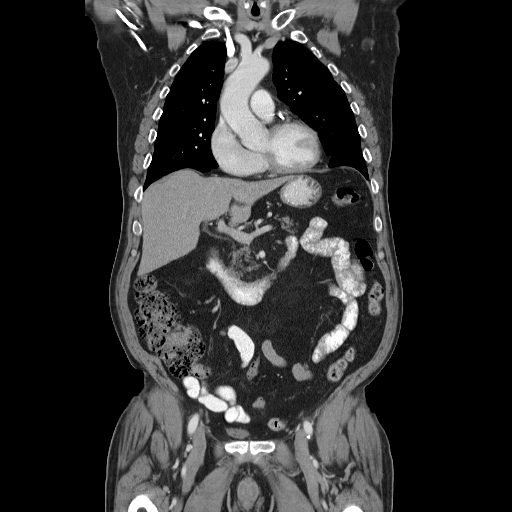
[im 59/107  soft-tissue]
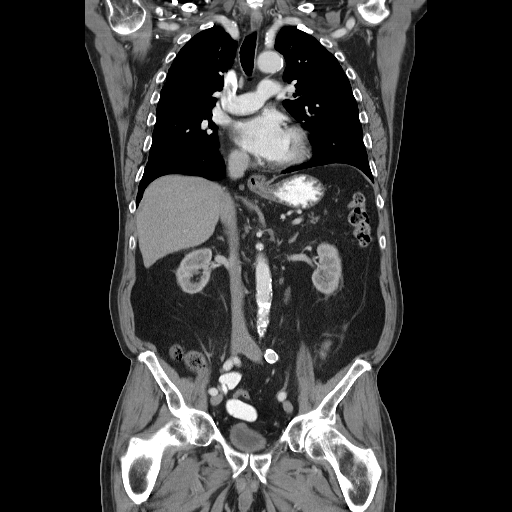

[15 of 46 positions shown; findings below may reference images not displayed]

FINDINGS: CT CHEST FINDINGS

Mediastinum/Nodes: There is no axillary lymphadenopathy. No
mediastinal or hilar lymphadenopathy. The nonenlarged AP window and
right paratracheal lymph node seen on the recent PET-CT are stable.
Thoracic esophagus is unremarkable. Thyroid gland has normal CT
imaging features. Heart size is normal. Coronary artery
calcification is noted. No pericardial effusion.

Lungs/Pleura: Anterior left upper lobe pulmonary nodule seen to be
hypermetabolic on recent PET-CT has a more elongated but narrow
configuration on today's study, measuring 18 x 6 mm today compared
to 13 x 9 mm previously.

A second left upper lobe hypermetabolic pulmonary nodules seen
previously is seen more posteriorly in the left upper lobe (image 21
series 4. This has decreased substantially in size in the interval,
now measuring 5 mm compared to 13 mm previously.

There are numerous other very tiny scattered bilateral pulmonary
nodules, most of which are subpleural. Changes of emphysema noted.
No focal airspace consolidation. No pulmonary edema. No evidence for
pleural effusion.

Musculoskeletal: Bone windows reveal no worrisome lytic or sclerotic
osseous lesions.

CT ABDOMEN AND PELVIS FINDINGS

Hepatobiliary: No focal abnormality within the liver parenchyma.
There is no evidence for gallstones, gallbladder wall thickening, or
pericholecystic fluid. No intrahepatic or extrahepatic biliary
dilation.

Pancreas: No focal mass lesion. No dilatation of the main duct. No
intraparenchymal cyst. No peripancreatic edema.

Spleen: No splenomegaly. No focal mass lesion.

Adrenals/Urinary Tract: No adrenal nodule or mass. 9 mm well-defined
low-density lesion in the interpolar left kidney is compatible with
a cyst. No enhancing lesion or hydronephrosis in either kidney.

Stomach/Bowel: Tiny hiatal hernia noted. Stomach otherwise
unremarkable. Duodenum is normally positioned as is the ligament of
Treitz. No small bowel wall thickening. No small bowel dilatation.
Terminal ileum normal. The appendix is not visualized, but there is
no edema or inflammation in the region of the cecum. No gross
colonic mass. No colonic wall thickening. No substantial
diverticular change.

Vascular/Lymphatic: There is abdominal aortic atherosclerosis
without aneurysm. No lymphadenopathy in the abdomen or pelvis.

Reproductive: Mild prostatomegaly and seminal vesicles are
unremarkable.

Other: No intraperitoneal free fluid.

Musculoskeletal: Sclerosis in the right femoral neck is stable. No
worrisome focal lytic or sclerotic osseous abnormality. Stable
compression deformity at T12 and L1.
IMPRESSION: 1. One of the 2 hypermetabolic left upper lobe pulmonary nodules has
almost nearly resolved in the interval. The other has undergone some
evolution in shape, now appearing more elongated than round and now
measuring 18 x 6 mm compared to 13 x 9 mm previously.
2. No other new or progressive findings in the chest.
3. Emphysema.
4. Small stable probable cyst in the left kidney.

## 2016-07-05 ENCOUNTER — Ambulatory Visit: Payer: Medicare Other | Admitting: Internal Medicine

## 2017-08-05 IMAGING — DX DG CHEST 1V
1 series · 1 of 1 positions shown · non-contrast
Comparison: Radiograph February 19, 2016.

CLINICAL DATA: Status post left-sided thoracentesis.

EXAM:
CHEST 1 VIEW

[x chest ap]
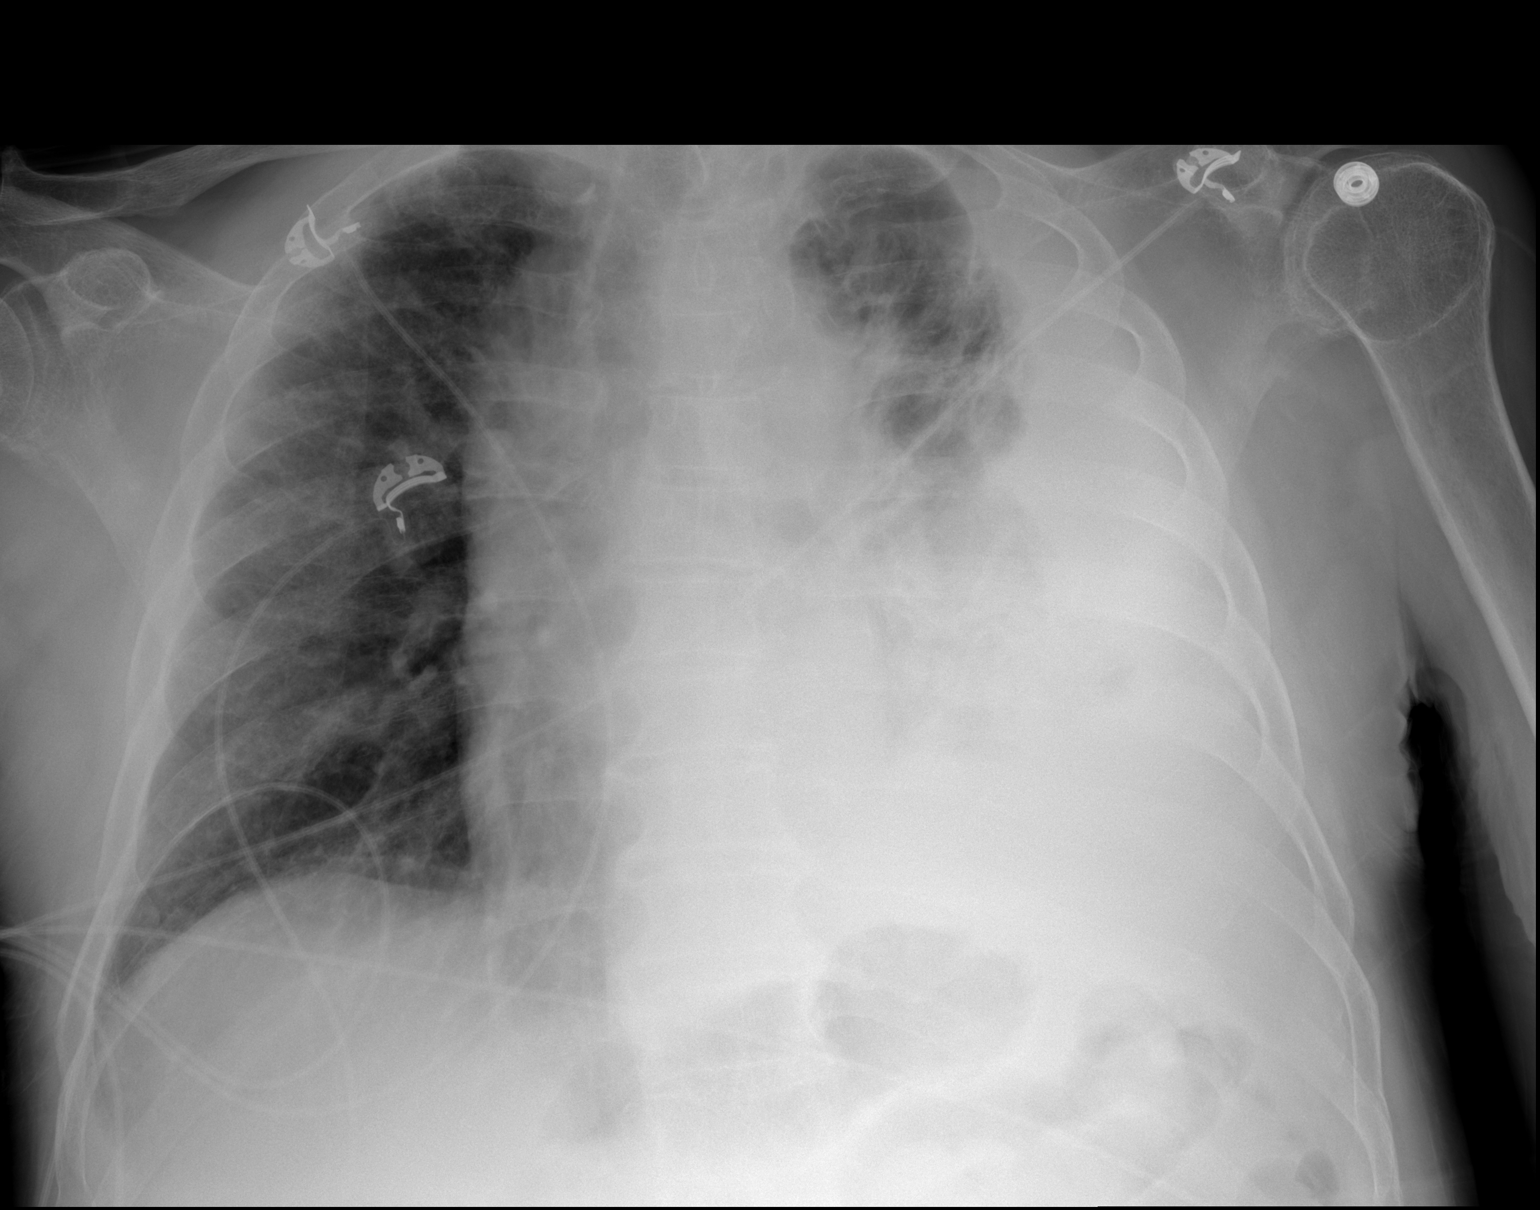

[1 of 1 positions shown; findings below may reference images not displayed]

FINDINGS: Cardiomegaly is noted. No pneumothorax is seen status post
left-sided thoracentesis. Left pleural effusion is slightly smaller
compared to prior exam, although large effusion remains with
associated atelectasis or inflammation in left lower lobe. Right
lung is unremarkable. Bony thorax is unremarkable.
IMPRESSION: Slightly improved left pleural effusion status post left-sided
thoracentesis. No pneumothorax is noted.

## 2018-03-27 IMAGING — US US THORACENTESIS ASP PLEURAL SPACE W/IMG GUIDE
1 series · 1 of 1 positions shown · non-contrast
Comparison: None.

MEDICATIONS:
10 cc 1% lidocaine

COMPLICATIONS:
None immediate.

INDICATION: Symptomatic left sided pleural effusion

EXAM:
US THORACENTESIS ASP PLEURAL SPACE W/IMG GUIDE
TECHNIQUE: Informed written consent was obtained from the patient after a
discussion of the risks, benefits and alternatives to treatment. A
timeout was performed prior to the initiation of the procedure.

[Series 1: us thoracentesis asp pleural space w/img guide · 0.34mm/px · 1 of 1 slices shown]
[im 1/1]
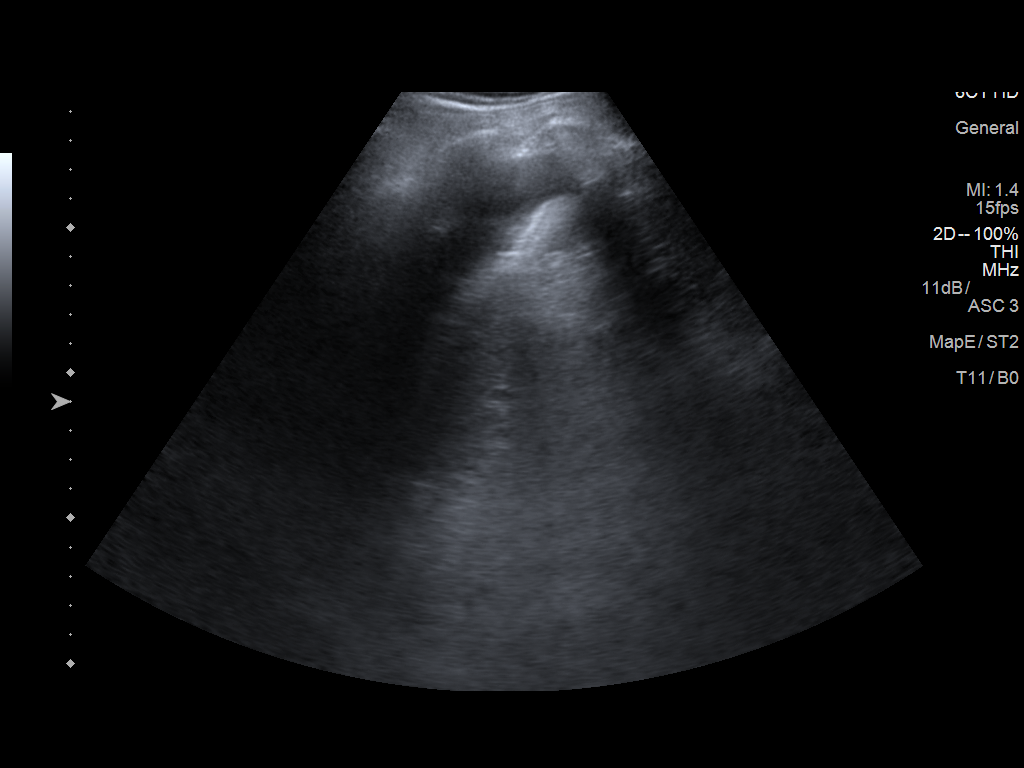

[1 of 1 positions shown; findings below may reference images not displayed]

Initial ultrasound scanning demonstrates a left pleural effusion.
The lower chest was prepped and draped in the usual sterile fashion.
1% lidocaine was used for local anesthesia.

Under direct ultrasound guidance, a 19 gauge, 7-cm, Yueh catheter
was introduced. An ultrasound image was saved for documentation
purposes. The thoracentesis was performed. The catheter was removed
and a dressing was applied. The patient tolerated the procedure well
without immediate post procedural complication. The patient was
escorted to have an upright chest radiograph.
FINDINGS: A total of approximately 1.5 liters of bloody fluid was removed.
Requested samples were sent to the laboratory.
IMPRESSION: Successful ultrasound-guided left sided thoracentesis yielding
liters of pleural fluid.

Read by:  Servillano Rimes
